# Patient Record
Sex: Female | Born: 1935
Health system: Southern US, Community
[De-identification: ages and names within clinical notes are randomized; demographics above are authoritative.]

## PROBLEM LIST (undated history)

## (undated) DIAGNOSIS — G4700 Insomnia, unspecified: Secondary | ICD-10-CM

## (undated) DIAGNOSIS — M1990 Unspecified osteoarthritis, unspecified site: Secondary | ICD-10-CM

## (undated) DIAGNOSIS — H409 Unspecified glaucoma: Secondary | ICD-10-CM

## (undated) DIAGNOSIS — M25552 Pain in left hip: Secondary | ICD-10-CM

## (undated) DIAGNOSIS — Z85828 Personal history of other malignant neoplasm of skin: Secondary | ICD-10-CM

## (undated) DIAGNOSIS — E039 Hypothyroidism, unspecified: Secondary | ICD-10-CM

## (undated) DIAGNOSIS — E785 Hyperlipidemia, unspecified: Secondary | ICD-10-CM

## (undated) DIAGNOSIS — E119 Type 2 diabetes mellitus without complications: Secondary | ICD-10-CM

## (undated) DIAGNOSIS — Z86018 Personal history of other benign neoplasm: Secondary | ICD-10-CM

## (undated) DIAGNOSIS — E46 Unspecified protein-calorie malnutrition: Secondary | ICD-10-CM

## (undated) HISTORY — DX: Insomnia, unspecified: G47.00

## (undated) HISTORY — DX: Unspecified protein-calorie malnutrition: E46

## (undated) HISTORY — DX: Pain in left hip: M25.552

## (undated) HISTORY — DX: Unspecified osteoarthritis, unspecified site: M19.90

## (undated) HISTORY — DX: Personal history of other malignant neoplasm of skin: Z85.828

## (undated) HISTORY — DX: Unspecified glaucoma: H40.9

## (undated) HISTORY — DX: Hypothyroidism, unspecified: E03.9

## (undated) HISTORY — PX: HERNIA REPAIR: SHX51

---
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
## 2005-11-08 ENCOUNTER — Ambulatory Visit: Payer: Self-pay | Admitting: Family Medicine

## 2006-11-14 ENCOUNTER — Ambulatory Visit: Payer: Self-pay

## 2007-04-25 HISTORY — PX: TOTAL ABDOMINAL HYSTERECTOMY: SHX209

## 2007-06-28 ENCOUNTER — Ambulatory Visit: Payer: Self-pay | Admitting: Unknown Physician Specialty

## 2007-12-11 ENCOUNTER — Ambulatory Visit: Payer: Self-pay | Admitting: Obstetrics and Gynecology

## 2007-12-11 ENCOUNTER — Other Ambulatory Visit: Payer: Self-pay

## 2007-12-16 ENCOUNTER — Inpatient Hospital Stay: Payer: Self-pay | Admitting: Obstetrics and Gynecology

## 2008-01-22 ENCOUNTER — Ambulatory Visit: Payer: Self-pay | Admitting: Family Medicine

## 2008-08-03 DIAGNOSIS — D239 Other benign neoplasm of skin, unspecified: Secondary | ICD-10-CM

## 2008-08-03 HISTORY — DX: Other benign neoplasm of skin, unspecified: D23.9

## 2009-04-14 ENCOUNTER — Ambulatory Visit: Payer: Self-pay

## 2009-08-23 DIAGNOSIS — C4491 Basal cell carcinoma of skin, unspecified: Secondary | ICD-10-CM

## 2009-08-23 DIAGNOSIS — C4492 Squamous cell carcinoma of skin, unspecified: Secondary | ICD-10-CM

## 2009-08-23 HISTORY — DX: Basal cell carcinoma of skin, unspecified: C44.91

## 2009-08-23 HISTORY — DX: Squamous cell carcinoma of skin, unspecified: C44.92

## 2009-11-01 DIAGNOSIS — Z85828 Personal history of other malignant neoplasm of skin: Secondary | ICD-10-CM

## 2009-11-01 HISTORY — DX: Personal history of other malignant neoplasm of skin: Z85.828

## 2010-04-29 ENCOUNTER — Ambulatory Visit: Payer: Self-pay

## 2010-05-11 ENCOUNTER — Ambulatory Visit: Payer: Self-pay

## 2011-06-14 ENCOUNTER — Ambulatory Visit: Payer: Self-pay | Admitting: Obstetrics and Gynecology

## 2012-06-14 ENCOUNTER — Ambulatory Visit: Payer: Self-pay | Admitting: Obstetrics and Gynecology

## 2013-06-27 ENCOUNTER — Ambulatory Visit: Payer: Self-pay | Admitting: Family Medicine

## 2014-05-12 ENCOUNTER — Ambulatory Visit: Payer: Self-pay | Admitting: Family Medicine

## 2014-08-07 ENCOUNTER — Ambulatory Visit
Admit: 2014-08-07 | Disposition: A | Discharge status: Home / Self Care | Payer: Self-pay | Attending: Family Medicine | Admitting: Family Medicine

## 2014-09-01 LAB — HEMOGLOBIN A1C: Hgb A1c MFr Bld: 6.2 % — AB (ref 4.0–6.0)

## 2014-09-09 LAB — BASIC METABOLIC PANEL
BUN: 16 mg/dL (ref 4–21)
Creatinine: 0.6 mg/dL (ref <=1.1)
Glucose: 103 mg/dL
POTASSIUM: 4.5 mmol/L (ref 3.4–5.3)
SODIUM: 140 mmol/L (ref 137–147)

## 2014-09-09 LAB — CBC AND DIFFERENTIAL
HEMATOCRIT: 42 % (ref 36–46)
HEMOGLOBIN: 13.7 g/dL (ref 12.0–16.0)
Neutrophils Absolute: 2 /uL
PLATELETS: 229 10*3/uL (ref 150–399)
WBC: 5.5 10^3/mL

## 2014-09-09 LAB — TSH: TSH: 1.66 u[IU]/mL (ref <=5.90)

## 2014-09-09 LAB — HEPATIC FUNCTION PANEL
ALT: 19 U/L (ref 7–35)
AST: 22 U/L (ref 13–35)
Alkaline Phosphatase: 102 U/L (ref 25–125)
BILIRUBIN, TOTAL: 0.5 mg/dL

## 2014-11-06 ENCOUNTER — Encounter: Payer: Self-pay | Admitting: Family Medicine

## 2014-11-06 ENCOUNTER — Ambulatory Visit (INDEPENDENT_AMBULATORY_CARE_PROVIDER_SITE_OTHER): Payer: PPO | Admitting: Family Medicine

## 2014-11-06 VITALS — BP 122/70 | HR 71 | Temp 98.1°F | Resp 16 | Ht 61.0 in | Wt 100.0 lb

## 2014-11-06 DIAGNOSIS — Z0000 Encounter for general adult medical examination without abnormal findings: Secondary | ICD-10-CM | POA: Diagnosis not present

## 2014-11-06 NOTE — Progress Notes (Signed)
Patient: Barbara Santos, Female    DOB: [birth year], 79 y.o.   MRN: [id number] Visit Date: 11/06/2014  Today's Provider: Dicky Doe, MD   Chief Complaint  Patient presents with  . Medicare Wellness    subsequent    Subjective:    Annual wellness visit Barbara Santos is a 79 y.o. female who presents today for her Subsequent Annual Wellness Visit. She feels well. She reports exercising . She reports she is sleeping well.   ----------------------------------------------------------- HPI  Review of Systems  History   Social History  . Marital Status: Married    Spouse Name: N/A  . Number of Children: N/A  . Years of Education: N/A   Occupational History  . Not on file.   Social History Main Topics  . Smoking status: Never Smoker   . Smokeless tobacco: Not on file  . Alcohol Use: No  . Drug Use: No  . Sexual Activity: Not on file   Other Topics Concern  . Not on file   Social History Narrative    There are no active problems to display for this patient.   Past Surgical History  Procedure Laterality Date  . Total abdominal hysterectomy  2009    Her family history includes Diabetes Mellitus II in her mother; Heart disease in her paternal grandfather; Stroke in her father and maternal grandmother.    Previous Medications   ACETAMINOPHEN (TYLENOL) 325 MG TABLET    Take 650 mg by mouth every 6 (six) hours as needed.   CALCIUM CARBONATE 1500 (600 CA) MG TABS    Take 1 tablet by mouth daily.   CHOLECALCIFEROL (VITAMIN D) 2000 UNITS TABLET    Take 2,000 Units by mouth daily.   DIPHENHYDRAMINE (BENADRYL) 25 MG TABLET    Take 25 mg by mouth every 6 (six) hours as needed.   GLUCOSE BLOOD TEST STRIP    1 each by Other route as needed for other. Use as instructed   GUAIFENESIN (MUCINEX) 600 MG 12 HR TABLET    Take by mouth 2 (two) times daily as needed.   IBUPROFEN (ADVIL,MOTRIN) 200 MG TABLET    Take 200 mg by mouth every 6 (six) hours as needed.   LACTOSE FREE NUTRITION (BOOST) LIQD    Take 237 mLs by mouth 3 (three) times daily between meals.   LEVOTHYROXINE (SYNTHROID, LEVOTHROID) 50 MCG TABLET    Take 50 mcg by mouth daily before breakfast.   MELOXICAM (MOBIC) 7.5 MG TABLET    Take 7.5 mg by mouth daily.   MULTIPLE VITAMIN (MULTIVITAMIN) TABLET    Take 1 tablet by mouth daily.   TIMOLOL (BETIMOL) 0.5 % OPHTHALMIC SOLUTION    Place 1 drop into both eyes 2 (two) times daily.    Patient Care Team: Arlis Porta., MD as PCP - General (Family Medicine)     Objective:   Vitals: BP 122/70 mmHg  Pulse 71  Temp(Src) 98.1 F (36.7 C) (Oral)  Resp 16  Ht 5\' 1"  (1.549 m)  Wt 100 lb (45.36 kg)  BMI 18.90 kg/m2  Physical Exam  Activities of Daily Living In your present state of health, do you have any difficulty performing the following activities: 11/06/2014 11/06/2014  Hearing? - -  Vision? - -  Difficulty concentrating or making decisions? - -  Walking or climbing stairs? - -  Dressing or bathing? - -  Doing errands, shopping? - -  Preparing Food and eating ? - N  Using the  Toilet? - N  In the past six months, have you accidently leaked urine? - -  Do you have problems with loss of bowel control? - -  Managing your Medications? N -  Managing your Finances? N -  Housekeeping or managing your Housekeeping? N -    Fall Risk Assessment Fall Risk  11/06/2014  Falls in the past year? No     Patient reports there are safety devices in place in shower at home.  Depression Screen PHQ 2/9 Scores 11/06/2014  PHQ - 2 Score 0     MMSE MMSE - Mini Mental State Exam 11/06/2014  Orientation to time 5  Orientation to Place 5  Registration 3  Attention/ Calculation 5  Recall 3  Language- name 2 objects 2  Language- repeat 1  Language- follow 3 step command 3  Language- read & follow direction 1  Write a sentence 1  Copy design 1  Total score 30     Assessment & Plan:     Annual Wellness Visit  Reviewed  patient's Family Medical History Reviewed and updated list of patient's medical providers Assessment of cognitive impairment was done Assessed patient's functional ability Established a written schedule for health screening [place] Completed and Reviewed  Exercise Activities and Dietary recommendations Goals    . Gain weight     Little weight gain by next year       Immunization History  Administered Date(s) Administered  . Influenza-Unspecified 01/28/2014  . Pneumococcal Conjugate-13 12/03/2013  . Tdap 12/03/2013  . Zoster 04/24/2010    Health Maintenance  Topic Date Due  . INFLUENZA VACCINE  11/23/2014  . PNA vac Low Risk Adult (2 of 2 - PPSV23) 12/04/2014  . COLONOSCOPY  04/24/2017  . TETANUS/TDAP  12/04/2023  . DEXA SCAN  Completed  . ZOSTAVAX  Completed     Discussed health benefits of physical activity, and encouraged her to engage in regular exercise appropriate for her age and condition.    ------------------------------------------------------------------------------------------------------------   Problem List Items Addressed This Visit    None       Larene Beach, MD [facility]  11/06/2014

## 2015-01-05 ENCOUNTER — Ambulatory Visit (INDEPENDENT_AMBULATORY_CARE_PROVIDER_SITE_OTHER): Payer: PPO | Admitting: Family Medicine

## 2015-01-05 ENCOUNTER — Encounter: Payer: Self-pay | Admitting: Family Medicine

## 2015-01-05 DIAGNOSIS — E039 Hypothyroidism, unspecified: Secondary | ICD-10-CM | POA: Insufficient documentation

## 2015-01-05 DIAGNOSIS — E079 Disorder of thyroid, unspecified: Secondary | ICD-10-CM

## 2015-01-05 DIAGNOSIS — E1169 Type 2 diabetes mellitus with other specified complication: Secondary | ICD-10-CM | POA: Insufficient documentation

## 2015-01-05 DIAGNOSIS — Z23 Encounter for immunization: Secondary | ICD-10-CM

## 2015-01-05 DIAGNOSIS — E119 Type 2 diabetes mellitus without complications: Secondary | ICD-10-CM | POA: Insufficient documentation

## 2015-01-05 LAB — POCT GLYCOSYLATED HEMOGLOBIN (HGB A1C): Hemoglobin A1C: 6.1

## 2015-01-05 MED ORDER — LEVOTHYROXINE SODIUM 50 MCG PO TABS: 50.0000 ug | ORAL_TABLET | Freq: Every day | ORAL | Status: DC

## 2015-01-05 NOTE — Patient Instructions (Signed)
Continue current meds at current doses.

## 2015-01-05 NOTE — Progress Notes (Signed)
Name: Barbara Santos   MRN: [id number]    DOB: 1936/03/09   Date:01/05/2015       Progress Note  Subjective  Chief Complaint  Chief Complaint  Patient presents with  . Diabetes    A1C 6.2 09/01/2014 - Sugars running 110-130    HPI  Here for f/u of DM.  BSs at home run from 100-130.  She has not been able to gain any weight.  She feels well.  No c/o. No problem-specific assessment & plan notes found for this encounter.   Past Medical History  Diagnosis Date  . Diabetes   . Hypothyroidism   . Arthritis   . Glaucoma   . Protein calorie malnutrition   . Insomnia   . Left hip pain     Social History  Substance Use Topics  . Smoking status: Never Smoker   . Smokeless tobacco: Not on file  . Alcohol Use: No     Current outpatient prescriptions:  .  Cholecalciferol (VITAMIN D) 2000 UNITS tablet, Take 2,000 Units by mouth daily., Disp: , Rfl:  .  diphenhydrAMINE (BENADRYL) 25 MG tablet, Take 25 mg by mouth every 6 (six) hours as needed., Disp: , Rfl:  .  glucose blood test strip, 1 each by Other route as needed for other. Use as instructed, Disp: , Rfl:  .  lactose free nutrition (BOOST) LIQD, Take 237 mLs by mouth 3 (three) times daily between meals., Disp: , Rfl:  .  levothyroxine (SYNTHROID, LEVOTHROID) 50 MCG tablet, Take 50 mcg by mouth daily before breakfast., Disp: , Rfl:  .  Multiple Vitamin (MULTIVITAMIN) tablet, Take 1 tablet by mouth daily., Disp: , Rfl:  .  timolol (BETIMOL) 0.5 % ophthalmic solution, Place 1 drop into both eyes 2 (two) times daily., Disp: , Rfl:   Allergies  Allergen Reactions  . Penicillins     Review of Systems  Constitutional: Negative for fever, chills, weight loss and malaise/fatigue.  HENT: Negative for hearing loss.   Eyes: Negative for blurred vision and double vision.  Respiratory: Negative for cough, sputum production, shortness of breath and wheezing.   Cardiovascular: Negative for chest pain, palpitations, orthopnea and  leg swelling.  Gastrointestinal: Negative for heartburn, abdominal pain and blood in stool.  Genitourinary: Negative for dysuria, urgency and frequency.  Musculoskeletal: Negative for myalgias and joint pain.  Skin: Negative for rash.  Neurological: Negative for dizziness, sensory change, focal weakness, weakness and headaches.  Psychiatric/Behavioral: Negative for depression. The patient is not nervous/anxious.       Objective  There were no vitals filed for this visit.   Physical Exam  Constitutional: She is oriented to person, place, and time and well-developed, well-nourished, and in no distress. No distress.  HENT:  Head: Normocephalic and atraumatic.  Eyes: Conjunctivae and EOM are normal. No scleral icterus.  Neck: Normal range of motion. Neck supple. Carotid bruit is not present. No thyromegaly present.  Cardiovascular: Normal rate, regular rhythm, normal heart sounds and intact distal pulses.  Exam reveals no gallop and no friction rub.   No murmur heard. Pulmonary/Chest: Effort normal and breath sounds normal. No respiratory distress. She has no wheezes. She has no rales.  Abdominal: Soft. Bowel sounds are normal. She exhibits no distension, no abdominal bruit and no mass. There is no tenderness.  Musculoskeletal: She exhibits no edema.  Diabetic foot exam.  Normal skin.  No lesions.  Has hypertrophic nails on great toes bilaterally.  Normal pin prick, vibratory and position  sense.  Good pulses.  Lymphadenopathy:    She has no cervical adenopathy.  Neurological: She is alert and oriented to person, place, and time.  Vitals reviewed.     Recent Results (from the past 2160 hour(s))  POCT HgB A1C     Status: Abnormal   Collection Time: 01/05/15  8:53 AM  Result Value Ref Range   Hemoglobin A1C 6.1      Assessment & Plan  1. Type 2 diabetes mellitus without complication  - POCT HgB A1C-6.1 - HM Diabetes Foot Exam - Comprehensive Metabolic Panel (CMET) - Lipid  Profile  2. Need for influenza vaccination  - Flu vaccine HIGH DOSE PF (Fluzone High dose)  3. Thyroid disease  - CBC with Differential - TSH - levothyroxine (SYNTHROID, LEVOTHROID) 50 MCG tablet; Take 1 tablet (50 mcg total) by mouth daily before breakfast.  Dispense: 30 tablet; Refill: 12  4. Serum calcium elevated

## 2015-01-13 LAB — CBC WITH DIFFERENTIAL/PLATELET
Basophils Absolute: 0 10*3/uL (ref 0.0–0.2)
Basos: 0 %
EOS (ABSOLUTE): 0.1 10*3/uL (ref 0.0–0.4)
EOS: 2 %
HEMATOCRIT: 39.8 % (ref 34.0–46.6)
Hemoglobin: 13.6 g/dL (ref 11.1–15.9)
Immature Grans (Abs): 0 10*3/uL (ref 0.0–0.1)
Immature Granulocytes: 0 %
LYMPHS ABS: 2.2 10*3/uL (ref 0.7–3.1)
Lymphs: 43 %
MCH: 31.5 pg (ref 26.6–33.0)
MCHC: 34.2 g/dL (ref 31.5–35.7)
MCV: 92 fL (ref 79–97)
MONOS ABS: 0.4 10*3/uL (ref 0.1–0.9)
Monocytes: 8 %
Neutrophils Absolute: 2.3 10*3/uL (ref 1.4–7.0)
Neutrophils: 47 %
Platelets: 238 10*3/uL (ref 150–379)
RBC: 4.32 x10E6/uL (ref 3.77–5.28)
RDW: 13.4 % (ref 12.3–15.4)
WBC: 5.1 10*3/uL (ref 3.4–10.8)

## 2015-01-13 LAB — LIPID PANEL
CHOL/HDL RATIO: 3.3 ratio (ref 0.0–4.4)
Cholesterol, Total: 176 mg/dL (ref 100–199)
HDL: 54 mg/dL (ref >39)
LDL Calculated: 106 mg/dL — ABNORMAL HIGH (ref 0–99)
TRIGLYCERIDES: 80 mg/dL (ref 0–149)
VLDL Cholesterol Cal: 16 mg/dL (ref 5–40)

## 2015-01-13 LAB — COMPREHENSIVE METABOLIC PANEL
A/G RATIO: 1.9 (ref 1.1–2.5)
ALT: 16 IU/L (ref 0–32)
AST: 22 IU/L (ref 0–40)
Albumin: 4.4 g/dL (ref 3.5–4.8)
Alkaline Phosphatase: 96 IU/L (ref 39–117)
BILIRUBIN TOTAL: 0.5 mg/dL (ref 0.0–1.2)
BUN / CREAT RATIO: 24 (ref 11–26)
BUN: 18 mg/dL (ref 8–27)
CHLORIDE: 103 mmol/L (ref 97–108)
CO2: 26 mmol/L (ref 18–29)
Calcium: 10.3 mg/dL (ref 8.7–10.3)
Creatinine, Ser: 0.75 mg/dL (ref 0.57–1.00)
GFR calc non Af Amer: 77 mL/min/{1.73_m2} (ref >59)
GFR, EST AFRICAN AMERICAN: 89 mL/min/{1.73_m2} (ref >59)
Globulin, Total: 2.3 g/dL (ref 1.5–4.5)
Glucose: 108 mg/dL — ABNORMAL HIGH (ref 65–99)
POTASSIUM: 4.7 mmol/L (ref 3.5–5.2)
Sodium: 142 mmol/L (ref 134–144)
Total Protein: 6.7 g/dL (ref 6.0–8.5)

## 2015-01-13 LAB — TSH: TSH: 2.2 u[IU]/mL (ref 0.450–4.500)

## 2015-01-13 NOTE — Progress Notes (Signed)
Mailed

## 2015-04-13 ENCOUNTER — Ambulatory Visit (INDEPENDENT_AMBULATORY_CARE_PROVIDER_SITE_OTHER): Payer: PPO | Admitting: Family Medicine

## 2015-04-13 ENCOUNTER — Encounter: Payer: Self-pay | Admitting: Family Medicine

## 2015-04-13 VITALS — BP 134/73 | HR 61 | Resp 16 | Ht 61.0 in | Wt 100.0 lb

## 2015-04-13 DIAGNOSIS — E119 Type 2 diabetes mellitus without complications: Secondary | ICD-10-CM

## 2015-04-13 DIAGNOSIS — E079 Disorder of thyroid, unspecified: Secondary | ICD-10-CM | POA: Diagnosis not present

## 2015-04-13 LAB — POCT GLYCOSYLATED HEMOGLOBIN (HGB A1C): HEMOGLOBIN A1C: 6.1

## 2015-04-13 NOTE — Progress Notes (Signed)
Name: Barbara Santos   MRN: [id number]    DOB: 1936/03/31   Date:04/13/2015       Progress Note  Subjective  Chief Complaint  Chief Complaint  Patient presents with  . Diabetes    Range 104-152   A1C 6.1 ON 01/05/2015.     HPI Here for f/u of diet controlled DM.  Also with hypothyroidism.  Taking thyroid med and following low sugar diet. Feeling well overall. No problem-specific assessment & plan notes found for this encounter.   Past Medical History  Diagnosis Date  . Diabetes ([place])   . Hypothyroidism   . Arthritis   . Glaucoma   . Protein calorie malnutrition ([place])   . Insomnia   . Left hip pain     Past Surgical History  Procedure Laterality Date  . Total abdominal hysterectomy  2009    Family History  Problem Relation Age of Onset  . Diabetes Mellitus II Mother   . Stroke Father   . Stroke Maternal Grandmother   . Heart disease Paternal Grandfather     Social History   Social History  . Marital Status: Married    Spouse Name: N/A  . Number of Children: N/A  . Years of Education: N/A   Occupational History  . Not on file.   Social History Main Topics  . Smoking status: Never Smoker   . Smokeless tobacco: Not on file  . Alcohol Use: No  . Drug Use: No  . Sexual Activity: Not on file   Other Topics Concern  . Not on file   Social History Narrative     Current outpatient prescriptions:  .  Cholecalciferol (VITAMIN D) 2000 UNITS tablet, Take 2,000 Units by mouth daily., Disp: , Rfl:  .  diphenhydrAMINE (BENADRYL) 25 MG tablet, Take 25 mg by mouth every 6 (six) hours as needed., Disp: , Rfl:  .  glucose blood test strip, 1 each by Other route as needed for other. Use as instructed, Disp: , Rfl:  .  lactose free nutrition (BOOST) LIQD, Take 237 mLs by mouth 3 (three) times daily between meals., Disp: , Rfl:  .  levothyroxine (SYNTHROID, LEVOTHROID) 50 MCG tablet, Take 1 tablet (50 mcg total) by mouth daily before breakfast., Disp: 30 tablet,  Rfl: 12 .  Multiple Vitamin (MULTIVITAMIN) tablet, Take 1 tablet by mouth daily., Disp: , Rfl:  .  timolol (BETIMOL) 0.5 % ophthalmic solution, Place 1 drop into both eyes 2 (two) times daily., Disp: , Rfl:   Allergies  Allergen Reactions  . Penicillins      Review of Systems  Constitutional: Negative for fever, chills, weight loss and malaise/fatigue.  HENT: Negative for hearing loss.   Eyes: Negative for blurred vision and double vision.  Respiratory: Negative for cough, shortness of breath and wheezing.   Cardiovascular: Negative for chest pain, palpitations and leg swelling.  Gastrointestinal: Negative for heartburn, abdominal pain and blood in stool.  Genitourinary: Negative for dysuria, urgency and frequency.  Musculoskeletal: Negative for myalgias and joint pain.  Skin: Negative for rash.  Neurological: Negative for dizziness, tremors, weakness and headaches.      Objective  Filed Vitals:   04/13/15 1005  BP: 134/73  Pulse: 61  Resp: 16  Height: 5\' 1"  (1.549 m)  Weight: 100 lb (45.36 kg)    Physical Exam  Constitutional: She is oriented to person, place, and time and well-developed, well-nourished, and in no distress. No distress.  HENT:  Head: Normocephalic and atraumatic.  Eyes: Conjunctivae and EOM are normal. Pupils are equal, round, and reactive to light. No scleral icterus.  Neck: Normal range of motion. Neck supple. Carotid bruit is not present. No thyromegaly present.  Cardiovascular: Normal rate, regular rhythm and normal heart sounds.  Exam reveals no gallop and no friction rub.   No murmur heard. Pulmonary/Chest: Effort normal and breath sounds normal. No respiratory distress. She has no wheezes. She has no rales.  Abdominal: Bowel sounds are normal. She exhibits no distension, no abdominal bruit and no mass. There is no tenderness.  Musculoskeletal: Normal range of motion. She exhibits no edema.  Lymphadenopathy:    She has no cervical adenopathy.   Neurological: She is alert and oriented to person, place, and time.  Vitals reviewed.      Recent Results (from the past 2160 hour(s))  POCT HgB A1C     Status: Abnormal   Collection Time: 04/13/15 10:32 AM  Result Value Ref Range   Hemoglobin A1C 6.1      Assessment & Plan  Problem List Items Addressed This Visit      Endocrine   Diabetes ([place]) - Primary   Relevant Orders   POCT HgB A1C (Completed)   Thyroid disease      No orders of the defined types were placed in this encounter.   /1. Type 2 diabetes mellitus without complication, unspecified long term insulin use status (HCC) -cont. diet - POCT HgB A1C-6.1 2. Thyroid disease -cont Levothyroxine at current dose

## 2015-08-09 ENCOUNTER — Other Ambulatory Visit: Payer: Self-pay | Admitting: Family Medicine

## 2015-10-21 ENCOUNTER — Ambulatory Visit (INDEPENDENT_AMBULATORY_CARE_PROVIDER_SITE_OTHER): Payer: Medicare HMO | Admitting: Family Medicine

## 2015-10-21 ENCOUNTER — Encounter: Payer: Self-pay | Admitting: Family Medicine

## 2015-10-21 VITALS — BP 121/73 | HR 67 | Temp 98.0°F | Resp 16 | Ht 61.0 in | Wt 99.6 lb

## 2015-10-21 DIAGNOSIS — E079 Disorder of thyroid, unspecified: Secondary | ICD-10-CM

## 2015-10-21 DIAGNOSIS — E119 Type 2 diabetes mellitus without complications: Secondary | ICD-10-CM

## 2015-10-21 NOTE — Progress Notes (Signed)
Name: Barbara Santos   MRN: [id number]    DOB: 1935-08-19   Date:10/21/2015       Progress Note  Subjective  Chief Complaint  Chief Complaint  Patient presents with  . Diabetes    last A1C 6.1 04/13/15  . Thyroid Problem    HPI Here for f/u of Hypothyroidism and diet controlled DM.  She is taking  Her Levothyroxine  No problem-specific assessment & plan notes found for this encounter.   Past Medical History  Diagnosis Date  . Diabetes ([place])   . Hypothyroidism   . Arthritis   . Glaucoma   . Protein calorie malnutrition ([place])   . Insomnia   . Left hip pain     Past Surgical History  Procedure Laterality Date  . Total abdominal hysterectomy  2009    Family History  Problem Relation Age of Onset  . Diabetes Mellitus II Mother   . Stroke Father   . Stroke Maternal Grandmother   . Heart disease Paternal Grandfather     Social History   Social History  . Marital Status: Married    Spouse Name: N/A  . Number of Children: N/A  . Years of Education: N/A   Occupational History  . Not on file.   Social History Main Topics  . Smoking status: Never Smoker   . Smokeless tobacco: Never Used  . Alcohol Use: No  . Drug Use: No  . Sexual Activity: Not on file   Other Topics Concern  . Not on file   Social History Narrative     Current outpatient prescriptions:  .  Cholecalciferol (VITAMIN D) 2000 UNITS tablet, Take 2,000 Units by mouth daily., Disp: , Rfl:  .  diphenhydrAMINE (BENADRYL) 25 MG tablet, Take 25 mg by mouth every 6 (six) hours as needed., Disp: , Rfl:  .  dorzolamide-timolol (COSOPT) 22.3-6.8 MG/ML ophthalmic solution, Place 1 drop into the right eye 2 (two) times daily., Disp: , Rfl: 0 .  lactose free nutrition (BOOST) LIQD, Take 237 mLs by mouth 3 (three) times daily between meals., Disp: , Rfl:  .  levothyroxine (SYNTHROID, LEVOTHROID) 50 MCG tablet, Take 1 tablet (50 mcg total) by mouth daily before breakfast., Disp: 30 tablet, Rfl: 12 .   Multiple Vitamin (MULTIVITAMIN) tablet, Take 1 tablet by mouth daily., Disp: , Rfl:  .  ONETOUCH DELICA LANCETS FINE MISC, 1 each by Other route 3 (three) times daily., Disp: , Rfl: 0 .  ONETOUCH VERIO test strip, USE AS DIRECTED ONCE DAILY, Disp: 50 each, Rfl: 12 .  timolol (BETIMOL) 0.5 % ophthalmic solution, Place 1 drop into both eyes 2 (two) times daily., Disp: , Rfl:   Allergies  Allergen Reactions  . Penicillins      Review of Systems  Constitutional: Negative for fever, chills, weight loss and malaise/fatigue.  HENT: Negative for hearing loss.   Eyes: Negative for blurred vision and double vision.  Respiratory: Negative for cough, shortness of breath and wheezing.   Cardiovascular: Negative for chest pain, palpitations and leg swelling.  Gastrointestinal: Negative for heartburn, abdominal pain and blood in stool.  Genitourinary: Negative for dysuria, urgency and frequency.  Skin: Negative for rash.  Neurological: Negative for dizziness, tremors, weakness and headaches.      Objective  Filed Vitals:   10/21/15 0847  BP: 121/73  Pulse: 67  Temp: 98 F (36.7 C)  TempSrc: Oral  Resp: 16  Height: 5\' 1"  (1.549 m)  Weight: 99 lb 9.6 oz (45.178  kg)    Physical Exam  Constitutional: She is oriented to person, place, and time and well-developed, well-nourished, and in no distress. No distress.  HENT:  Head: Normocephalic and atraumatic.  Eyes: Conjunctivae and EOM are normal. Pupils are equal, round, and reactive to light. No scleral icterus.  Neck: Normal range of motion. Neck supple. Carotid bruit is not present. No thyromegaly present.  Cardiovascular: Normal rate, regular rhythm and normal heart sounds.  Exam reveals no gallop and no friction rub.   No murmur heard. Pulmonary/Chest: Effort normal and breath sounds normal. No respiratory distress. She has no wheezes. She has no rales.  Musculoskeletal: She exhibits no edema.  Lymphadenopathy:    She has no cervical  adenopathy.  Neurological: She is alert and oriented to person, place, and time.  Vitals reviewed.      No results found for this or any previous visit (from the past 2160 hour(s)).   Assessment & Plan  Problem List Items Addressed This Visit      Endocrine   Diabetes ([place]) - Primary   Relevant Orders   POCT HgB A1C   COMPLETE METABOLIC PANEL WITH GFR   CBC with Differential   Lipid Profile   Thyroid disease   Relevant Orders   TSH     Other   Serum calcium elevated      Meds ordered this encounter  Medications  . ONETOUCH DELICA LANCETS FINE MISC    Sig: 1 each by Other route 3 (three) times daily.    Refill:  0  . dorzolamide-timolol (COSOPT) 22.3-6.8 MG/ML ophthalmic solution    Sig: Place 1 drop into the right eye 2 (two) times daily.    Refill:  0   1. Type 2 diabetes mellitus without complication, unspecified long term insulin use status (HCC)  - POCT HgB A1C-6.1 - COMPLETE METABOLIC PANEL WITH GFR - CBC with Differential - Lipid Profile  2. Thyroid disease Cont Levothyroxine, 50 mcg/d - TSH  3. Serum calcium elevated

## 2015-10-22 LAB — POCT GLYCOSYLATED HEMOGLOBIN (HGB A1C): Hemoglobin A1C: 6.1

## 2015-10-22 NOTE — Progress Notes (Signed)
Done

## 2015-11-01 ENCOUNTER — Telehealth: Payer: Self-pay

## 2015-11-01 NOTE — Telephone Encounter (Signed)
Pt.notified

## 2015-11-01 NOTE — Telephone Encounter (Signed)
[email or web address]. sd  

## 2015-11-01 NOTE — Telephone Encounter (Signed)
-----   Message from Jeanelle Malling, [place] sent at 11/01/2015  2:18 PM EDT -----   ----- Message -----    From: Arlis Porta., MD    Sent: 11/01/2015  12:33 PM      To: [facility]   A1c is 6.1.  Same as last winter.  Just watch sugars.-jh

## 2015-11-02 ENCOUNTER — Other Ambulatory Visit: Payer: PPO

## 2015-11-03 LAB — COMPLETE METABOLIC PANEL WITH GFR
ALT: 17 U/L (ref 6–29)
AST: 20 U/L (ref 10–35)
Albumin: 4.1 g/dL (ref 3.6–5.1)
Alkaline Phosphatase: 93 U/L (ref 33–130)
BUN: 19 mg/dL (ref 7–25)
CHLORIDE: 106 mmol/L (ref 98–110)
CO2: 27 mmol/L (ref 20–31)
CREATININE: 0.71 mg/dL (ref 0.60–0.93)
Calcium: 10.1 mg/dL (ref 8.6–10.4)
GFR, Est African American: >89 mL/min (ref >=60)
GFR, Est Non African American: 82 mL/min (ref >=60)
GLUCOSE: 106 mg/dL — AB (ref 65–99)
POTASSIUM: 4.8 mmol/L (ref 3.5–5.3)
SODIUM: 138 mmol/L (ref 135–146)
Total Bilirubin: 0.5 mg/dL (ref 0.2–1.2)
Total Protein: 6.6 g/dL (ref 6.1–8.1)

## 2015-11-03 LAB — CBC WITH DIFFERENTIAL/PLATELET
BASOS ABS: 0 {cells}/uL (ref 0–200)
Basophils Relative: 0 %
Eosinophils Absolute: 92 cells/uL (ref 15–500)
Eosinophils Relative: 2 %
HEMATOCRIT: 39.7 % (ref 35.0–45.0)
HEMOGLOBIN: 13.2 g/dL (ref 11.7–15.5)
LYMPHS ABS: 2162 {cells}/uL (ref 850–3900)
LYMPHS PCT: 47 %
MCH: 30.9 pg (ref 27.0–33.0)
MCHC: 33.2 g/dL (ref 32.0–36.0)
MCV: 93 fL (ref 80.0–100.0)
MONO ABS: 506 {cells}/uL (ref 200–950)
MPV: 10.7 fL (ref 7.5–12.5)
Monocytes Relative: 11 %
NEUTROS PCT: 40 %
Neutro Abs: 1840 cells/uL (ref 1500–7800)
Platelets: 231 10*3/uL (ref 140–400)
RBC: 4.27 MIL/uL (ref 3.80–5.10)
RDW: 12.6 % (ref 11.0–15.0)
WBC: 4.6 10*3/uL (ref 3.8–10.8)

## 2015-11-03 LAB — LIPID PANEL
CHOL/HDL RATIO: 2.8 ratio (ref <=5.0)
CHOLESTEROL: 166 mg/dL (ref 125–200)
HDL: 59 mg/dL (ref >=46)
LDL Cholesterol: 93 mg/dL (ref <130)
Triglycerides: 70 mg/dL (ref <150)
VLDL: 14 mg/dL (ref <30)

## 2015-11-03 LAB — TSH: TSH: 1.07 mIU/L

## 2015-11-29 ENCOUNTER — Other Ambulatory Visit: Payer: Self-pay | Admitting: Family Medicine

## 2015-11-29 DIAGNOSIS — Z1231 Encounter for screening mammogram for malignant neoplasm of breast: Secondary | ICD-10-CM

## 2015-12-09 ENCOUNTER — Ambulatory Visit
Admission: RE | Admit: 2015-12-09 | Discharge: 2015-12-09 | Disposition: A | Discharge status: Home / Self Care | Payer: Medicare HMO | Source: Ambulatory Visit | Attending: Family Medicine | Admitting: Family Medicine

## 2015-12-09 ENCOUNTER — Other Ambulatory Visit: Payer: Self-pay | Admitting: Family Medicine

## 2015-12-09 DIAGNOSIS — Z1231 Encounter for screening mammogram for malignant neoplasm of breast: Secondary | ICD-10-CM | POA: Insufficient documentation

## 2015-12-28 LAB — HM DIABETES EYE EXAM

## 2016-01-07 ENCOUNTER — Other Ambulatory Visit: Payer: Self-pay | Admitting: Family Medicine

## 2016-01-07 DIAGNOSIS — E079 Disorder of thyroid, unspecified: Secondary | ICD-10-CM

## 2016-01-12 NOTE — Progress Notes (Signed)
Eye exam?

## 2016-01-19 ENCOUNTER — Encounter: Payer: Self-pay | Admitting: Family Medicine

## 2016-02-24 ENCOUNTER — Ambulatory Visit (INDEPENDENT_AMBULATORY_CARE_PROVIDER_SITE_OTHER): Payer: Medicare HMO | Admitting: Family Medicine

## 2016-02-24 ENCOUNTER — Encounter: Payer: Self-pay | Admitting: Family Medicine

## 2016-02-24 VITALS — BP 130/60 | HR 60 | Temp 97.8°F | Resp 16 | Ht 61.0 in | Wt 99.0 lb

## 2016-02-24 DIAGNOSIS — E0800 Diabetes mellitus due to underlying condition with hyperosmolarity without nonketotic hyperglycemic-hyperosmolar coma (NKHHC): Secondary | ICD-10-CM | POA: Diagnosis not present

## 2016-02-24 DIAGNOSIS — Z23 Encounter for immunization: Secondary | ICD-10-CM | POA: Diagnosis not present

## 2016-02-24 DIAGNOSIS — E079 Disorder of thyroid, unspecified: Secondary | ICD-10-CM | POA: Diagnosis not present

## 2016-02-24 LAB — HEMOGLOBIN A1C
HEMOGLOBIN A1C: 6 % — AB (ref <5.7)
Mean Plasma Glucose: 126 mg/dL

## 2016-02-24 NOTE — Progress Notes (Signed)
Name: Barbara Santos   MRN: [id number]    DOB: [birth year]   Date:02/24/2016       Progress Note  Subjective  Chief Complaint  Chief Complaint  Patient presents with  . Diabetes  . Hypothyroidism    HPI Here for f/u of DM.  She has been diet controlled.  Her BSs at home over past 2 months have ranged from 95-190.  Weight is stable at 99#.  She is also hypothyroid and is controlled on medication.  No problem-specific Assessment & Plan notes found for this encounter.   Past Medical History:  Diagnosis Date  . Arthritis   . Diabetes ([place])   . Glaucoma   . Hypothyroidism   . Insomnia   . Left hip pain   . Protein calorie malnutrition ([place])     Past Surgical History:  Procedure Laterality Date  . TOTAL ABDOMINAL HYSTERECTOMY  2009    Family History  Problem Relation Age of Onset  . Diabetes Mellitus II Mother   . Stroke Father   . Stroke Maternal Grandmother   . Heart disease Paternal Grandfather     Social History   Social History  . Marital status: Married    Spouse name: N/A  . Number of children: N/A  . Years of education: N/A   Occupational History  . Not on file.   Social History Main Topics  . Smoking status: Never Smoker  . Smokeless tobacco: Never Used  . Alcohol use No  . Drug use: No  . Sexual activity: Not on file   Other Topics Concern  . Not on file   Social History Narrative  . No narrative on file     Current Outpatient Prescriptions:  .  Cholecalciferol (VITAMIN D) 2000 UNITS tablet, Take 2,000 Units by mouth daily., Disp: , Rfl:  .  diphenhydrAMINE (BENADRYL) 25 MG tablet, Take 25 mg by mouth every 6 (six) hours as needed., Disp: , Rfl:  .  lactose free nutrition (BOOST) LIQD, Take 237 mLs by mouth 3 (three) times daily between meals., Disp: , Rfl:  .  levothyroxine (SYNTHROID, LEVOTHROID) 50 MCG tablet, TAKE ONE TABLET BY MOUTH EVERY DAY BEFORE BREAKFAST, Disp: 30 tablet, Rfl: 2 .  Multiple Vitamin (MULTIVITAMIN) tablet,  Take 1 tablet by mouth daily., Disp: , Rfl:  .  ONETOUCH DELICA LANCETS FINE MISC, 1 each by Other route 3 (three) times daily., Disp: , Rfl: 0 .  ONETOUCH VERIO test strip, USE AS DIRECTED ONCE DAILY, Disp: 50 each, Rfl: 12 .  dorzolamide-timolol (COSOPT) 22.3-6.8 MG/ML ophthalmic solution, Place 1 drop into both eyes 2 (two) times daily. , Disp: , Rfl: 0  Allergies  Allergen Reactions  . Penicillins      Review of Systems  Constitutional: Negative for chills, fever, malaise/fatigue and weight loss.  HENT: Negative for hearing loss.   Eyes: Negative for blurred vision and double vision.  Respiratory: Negative for cough, shortness of breath and wheezing.   Cardiovascular: Negative for chest pain, palpitations and leg swelling.  Gastrointestinal: Negative for abdominal pain, blood in stool, diarrhea, heartburn, nausea and vomiting.  Genitourinary: Negative for dysuria, frequency and urgency.  Musculoskeletal: Negative for back pain, joint pain and myalgias.  Skin: Negative for rash.  Neurological: Negative for dizziness, tremors, weakness and headaches.      Objective  Vitals:   02/24/16 0837 02/24/16 0856  BP: (!) 151/74 130/60  Pulse: 60   Resp: 16   Temp: 97.8 F (36.6 C)  TempSrc: Oral   Weight: 99 lb (44.9 kg)   Height: 5\' 1"  (1.549 m)     Physical Exam  Constitutional: She is oriented to person, place, and time. No distress.  HENT:  Head: Normocephalic and atraumatic.  Eyes: Conjunctivae and EOM are normal. No scleral icterus.  Neck: Normal range of motion. Neck supple. Carotid bruit is not present. No thyromegaly present.  Cardiovascular: Normal rate, regular rhythm and normal heart sounds.  Exam reveals no gallop and no friction rub.   No murmur heard. Pulmonary/Chest: Effort normal and breath sounds normal. No respiratory distress. She has no wheezes. She has no rales.  Abdominal: Soft. Bowel sounds are normal. She exhibits no distension and no mass. There is  no tenderness.  Musculoskeletal: She exhibits no edema.  Lymphadenopathy:    She has no cervical adenopathy.  Neurological: She is alert and oriented to person, place, and time.  Vitals reviewed.      Recent Results (from the past 2160 hour(s))  HM DIABETES EYE EXAM     Status: None   Collection Time: 12/28/15 12:00 AM  Result Value Ref Range   HM Diabetic Eye Exam No Retinopathy No Retinopathy     Assessment & Plan  Problem List Items Addressed This Visit      Endocrine   Diabetes ([place])   Relevant Orders   HgB A1c   Thyroid disease    Other Visit Diagnoses    Need for vaccination    -  Primary   Relevant Orders   Flu vaccine HIGH DOSE PF (Fluzone High dose)      No orders of the defined types were placed in this encounter.  1. Need for vaccination  - Flu vaccine HIGH DOSE PF (Fluzone High dose)  2. Diabetes mellitus due to underlying condition with hyperosmolarity without coma, without long-term current use of insulin (HCC)-diet controlled  - HgB A1c  3. Thyroid disease Cont med.

## 2016-04-08 ENCOUNTER — Other Ambulatory Visit: Payer: Self-pay | Admitting: Family Medicine

## 2016-04-08 DIAGNOSIS — E079 Disorder of thyroid, unspecified: Secondary | ICD-10-CM

## 2016-06-27 ENCOUNTER — Ambulatory Visit (INDEPENDENT_AMBULATORY_CARE_PROVIDER_SITE_OTHER): Payer: Medicare Other | Admitting: Family Medicine

## 2016-06-27 ENCOUNTER — Encounter: Payer: Self-pay | Admitting: Family Medicine

## 2016-06-27 VITALS — BP 150/60 | HR 71 | Temp 97.6°F | Resp 16 | Ht 61.0 in | Wt 98.0 lb

## 2016-06-27 DIAGNOSIS — E0800 Diabetes mellitus due to underlying condition with hyperosmolarity without nonketotic hyperglycemic-hyperosmolar coma (NKHHC): Secondary | ICD-10-CM | POA: Diagnosis not present

## 2016-06-27 DIAGNOSIS — E079 Disorder of thyroid, unspecified: Secondary | ICD-10-CM | POA: Diagnosis not present

## 2016-06-27 MED ORDER — LEVOTHYROXINE SODIUM 50 MCG PO TABS: ORAL_TABLET | ORAL | 3 refills | Status: DC

## 2016-06-27 NOTE — Progress Notes (Signed)
Name: Barbara Santos   MRN: [id number]    DOB: [birth year]   Date:06/27/2016       Progress Note  Subjective  Chief Complaint  Chief Complaint  Patient presents with  . Diabetes  . Hypothyroidism    HPI Here for f/u of diet controlled DM and Hypothyroidism.   She feels well.  BSs from 100 - 200 with most <160.  She has no c/o.  No problem-specific Assessment & Plan notes found for this encounter.   Past Medical History:  Diagnosis Date  . Arthritis   . Diabetes ([place])   . Glaucoma   . Hypothyroidism   . Insomnia   . Left hip pain   . Protein calorie malnutrition ([place])     Past Surgical History:  Procedure Laterality Date  . TOTAL ABDOMINAL HYSTERECTOMY  2009    Family History  Problem Relation Age of Onset  . Diabetes Mellitus II Mother   . Stroke Father   . Stroke Maternal Grandmother   . Heart disease Paternal Grandfather     Social History   Social History  . Marital status: Married    Spouse name: N/A  . Number of children: N/A  . Years of education: N/A   Occupational History  . Not on file.   Social History Main Topics  . Smoking status: Never Smoker  . Smokeless tobacco: Never Used  . Alcohol use No  . Drug use: No  . Sexual activity: Not on file   Other Topics Concern  . Not on file   Social History Narrative  . No narrative on file     Current Outpatient Prescriptions:  .  Cholecalciferol (VITAMIN D) 2000 UNITS tablet, Take 2,000 Units by mouth daily., Disp: , Rfl:  .  diphenhydrAMINE (BENADRYL) 25 MG tablet, Take 25 mg by mouth every 6 (six) hours as needed., Disp: , Rfl:  .  dorzolamide-timolol (COSOPT) 22.3-6.8 MG/ML ophthalmic solution, Place 1 drop into both eyes 2 (two) times daily. , Disp: , Rfl: 0 .  lactose free nutrition (BOOST) LIQD, Take 237 mLs by mouth 3 (three) times daily between meals., Disp: , Rfl:  .  levothyroxine (SYNTHROID, LEVOTHROID) 50 MCG tablet, TAKE 1 TABLET BY MOUTH EVERY DAY BEFORE BREAKFAST, Disp: 30  tablet, Rfl: 2 .  Multiple Vitamin (MULTIVITAMIN) tablet, Take 1 tablet by mouth daily., Disp: , Rfl:  .  ONETOUCH DELICA LANCETS FINE MISC, 1 each by Other route 3 (three) times daily., Disp: , Rfl: 0 .  ONETOUCH VERIO test strip, USE AS DIRECTED ONCE DAILY, Disp: 50 each, Rfl: 12  Allergies  Allergen Reactions  . Penicillins      Review of Systems  Constitutional: Negative for chills, fever, malaise/fatigue and weight loss.  HENT: Negative for hearing loss and tinnitus.   Eyes: Negative for blurred vision and double vision.  Respiratory: Negative for cough, shortness of breath and wheezing.   Cardiovascular: Negative for chest pain, palpitations and leg swelling.  Gastrointestinal: Negative for abdominal pain, blood in stool and heartburn.  Genitourinary: Negative for dysuria, frequency and urgency.  Musculoskeletal: Negative for joint pain and myalgias.  Skin: Negative for rash.  Neurological: Negative for dizziness, tingling, tremors, weakness and headaches.      Objective  Vitals:   06/27/16 0923 06/27/16 0952  BP: (!) 158/78 (!) 150/60  Pulse: 71   Resp: 16   Temp: 97.6 F (36.4 C)   TempSrc: Oral   Weight: 98 lb (44.5 kg)   Height:  5\' 1"  (1.549 m)     Physical Exam  Constitutional: She is oriented to person, place, and time and well-developed, well-nourished, and in no distress. No distress.  HENT:  Head: Normocephalic and atraumatic.  Eyes: Conjunctivae and EOM are normal. Pupils are equal, round, and reactive to light. No scleral icterus.  Neck: Normal range of motion. Neck supple. Carotid bruit is not present. No thyromegaly present.  Cardiovascular: Normal rate, regular rhythm and normal heart sounds.  Exam reveals no gallop and no friction rub.   No murmur heard. Pulmonary/Chest: Effort normal and breath sounds normal. No respiratory distress. She has no wheezes. She has no rales.  Musculoskeletal: She exhibits no edema.  Lymphadenopathy:    She has no  cervical adenopathy.  Neurological: She is alert and oriented to person, place, and time.  Vitals reviewed.      No results found for this or any previous visit (from the past 2160 hour(s)).   Assessment & Plan  Problem List Items Addressed This Visit      Endocrine   Diabetes ([place])   Relevant Orders   HgB A1c   Thyroid disease - Primary   Relevant Orders   TSH      No orders of the defined types were placed in this encounter.  1. Thyroid disease cont Levothyroxine 50 mcg/d. - TSH  2. Diabetes mellitus due to underlying condition with hyperosmolarity without coma, without long-term current use of insulin (HCC) Cont to watch better - HgB A1c

## 2016-06-28 LAB — TSH: TSH: 2.04 m[IU]/L

## 2016-06-28 LAB — HEMOGLOBIN A1C
HEMOGLOBIN A1C: 6.3 % — AB (ref <5.7)
MEAN PLASMA GLUCOSE: 134 mg/dL

## 2016-07-06 ENCOUNTER — Other Ambulatory Visit: Payer: Self-pay | Admitting: Family Medicine

## 2016-07-06 DIAGNOSIS — E079 Disorder of thyroid, unspecified: Secondary | ICD-10-CM

## 2016-08-10 ENCOUNTER — Other Ambulatory Visit: Payer: Self-pay | Admitting: *Deleted

## 2016-08-10 MED ORDER — GLUCOSE BLOOD VI STRP: ORAL_STRIP | 12 refills | Status: DC

## 2016-08-15 DIAGNOSIS — B351 Tinea unguium: Secondary | ICD-10-CM | POA: Diagnosis not present

## 2016-08-15 DIAGNOSIS — Q828 Other specified congenital malformations of skin: Secondary | ICD-10-CM | POA: Diagnosis not present

## 2016-08-15 DIAGNOSIS — E119 Type 2 diabetes mellitus without complications: Secondary | ICD-10-CM | POA: Diagnosis not present

## 2016-10-24 ENCOUNTER — Ambulatory Visit (INDEPENDENT_AMBULATORY_CARE_PROVIDER_SITE_OTHER): Payer: Medicare Other

## 2016-10-24 VITALS — BP 118/70 | HR 74 | Temp 98.1°F | Resp 15 | Ht 61.0 in | Wt 96.8 lb

## 2016-10-24 DIAGNOSIS — Z23 Encounter for immunization: Secondary | ICD-10-CM

## 2016-10-24 DIAGNOSIS — Z0000 Encounter for general adult medical examination without abnormal findings: Secondary | ICD-10-CM | POA: Diagnosis not present

## 2016-10-24 NOTE — Progress Notes (Signed)
Subjective:   Barbara Santos is a 81 y.o. female who presents for Medicare Annual (Subsequent) preventive examination.   Review of Systems:   Cardiac Risk Factors include: advanced age (>77men, >19 women);hypertension;diabetes mellitus;dyslipidemia     Objective:     Vitals: BP 118/70 (BP Location: Left Arm, Patient Position: Sitting)   Pulse 74   Temp 98.1 F (36.7 C)   Resp 15   Ht 5\' 1"  (1.549 m)   Wt 96 lb 12.8 oz (43.9 kg)   BMI 18.29 kg/m   Body mass index is 18.29 kg/m.   Tobacco History  Smoking Status  . Never Smoker  Smokeless Tobacco  . Never Used     Counseling given: Not Answered   Past Medical History:  Diagnosis Date  . Arthritis   . Diabetes ([place])   . Glaucoma   . Hypothyroidism   . Insomnia   . Left hip pain   . Protein calorie malnutrition ([place])    Past Surgical History:  Procedure Laterality Date  . TOTAL ABDOMINAL HYSTERECTOMY  2009   Family History  Problem Relation Age of Onset  . Diabetes Mellitus II Mother   . Stroke Father   . Stroke Maternal Grandmother   . Heart disease Paternal Grandfather    History  Sexual Activity  . Sexual activity: Not on file    Outpatient Encounter Prescriptions as of 10/24/2016  Medication Sig  . Cholecalciferol (VITAMIN D) 2000 UNITS tablet Take 2,000 Units by mouth daily.  . diphenhydrAMINE (BENADRYL) 25 MG tablet Take 25 mg by mouth every 6 (six) hours as needed.  . dorzolamide-timolol (COSOPT) 22.3-6.8 MG/ML ophthalmic solution Place 1 drop into both eyes 2 (two) times daily.   Marland Kitchen glucose blood (ONETOUCH VERIO) test strip USE AS DIRECTED ONCE DAILY DX:E11.9  . lactose free nutrition (BOOST) LIQD Take 237 mLs by mouth 3 (three) times daily between meals.  Marland Kitchen levothyroxine (SYNTHROID, LEVOTHROID) 50 MCG tablet TAKE 1 TABLET BY MOUTH EVERY DAY BEFORE BREAKFAST  . Multiple Vitamin (MULTIVITAMIN) tablet Take 1 tablet by mouth daily.  Glory Rosebush DELICA LANCETS FINE MISC 1 each by Other  route 3 (three) times daily.   No facility-administered encounter medications on file as of 10/24/2016.     Activities of Daily Living In your present state of health, do you have any difficulty performing the following activities: 10/24/2016 06/27/2016  Hearing? N N  Vision? N N  Difficulty concentrating or making decisions? N N  Walking or climbing stairs? N N  Dressing or bathing? - N  Doing errands, shopping? N N  Preparing Food and eating ? N -  Using the Toilet? N -  In the past six months, have you accidently leaked urine? N -  Do you have problems with loss of bowel control? N -  Managing your Medications? N -  Managing your Finances? N -  Housekeeping or managing your Housekeeping? N -  Some recent data might be hidden    Patient Care Team: Arlis Porta., MD as PCP - General (Family Medicine)    Assessment:     Exercise Activities and Dietary recommendations Current Exercise Habits: The patient does not participate in regular exercise at present, Exercise limited by: None identified  Goals    . Gain weight          Little weight gain by next year    . Gain weight          Some weight gain  Fall Risk Fall Risk  10/24/2016 06/27/2016 02/24/2016 04/13/2015 11/06/2014  Falls in the past year? Yes Yes No No No  Number falls in past yr: 1 1 - - -  Injury with Fall? - No - - -  Follow up Falls prevention discussed - - - -   Depression Screen PHQ 2/9 Scores 10/24/2016 06/27/2016 02/24/2016 04/13/2015  PHQ - 2 Score 0 0 0 2  PHQ- 9 Score - - - 5     Cognitive Function MMSE - Mini Mental State Exam 11/06/2014  Orientation to time 5  Orientation to Place 5  Registration 3  Attention/ Calculation 5  Recall 3  Language- name 2 objects 2  Language- repeat 1  Language- follow 3 step command 3  Language- read & follow direction 1  Write a sentence 1  Copy design 1  Total score 30     6CIT Screen 10/24/2016  What Year? 0 points  What month? 0 points  What  time? 0 points  Count back from 20 0 points  Months in reverse 0 points  Repeat phrase 0 points  Total Score 0    Immunization History  Administered Date(s) Administered  . Influenza, High Dose Seasonal PF 01/05/2015, 02/24/2016  . Influenza-Unspecified 01/28/2014  . Pneumococcal Conjugate-13 12/03/2013  . Pneumococcal Polysaccharide-23 10/24/2016  . Tdap 12/03/2013  . Zoster 04/24/2010   Screening Tests Health Maintenance  Topic Date Due  . FOOT EXAM  10/20/2016  . URINE MICROALBUMIN  01/03/2017 (Originally 01/05/2016)  . INFLUENZA VACCINE  11/22/2016  . OPHTHALMOLOGY EXAM  12/27/2016  . HEMOGLOBIN A1C  12/28/2016  . TETANUS/TDAP  12/04/2023  . DEXA SCAN  Completed  . PNA vac Low Risk Adult  Completed      Plan:    I have personally reviewed and addressed the Medicare Annual Wellness questionnaire and have noted the following in the patient's chart:  A. Medical and social history B. Use of alcohol, tobacco or illicit drugs  C. Current medications and supplements D. Functional ability and status E.  Nutritional status F.  Physical activity G. Advance directives H. List of other physicians I.  Hospitalizations, surgeries, and ER visits in previous 12 months J.  [place] such as hearing and vision if needed, cognitive and depression L. Referrals and appointments  In addition, I have reviewed and discussed with patient certain preventive protocols, quality metrics, and best practice recommendations. A written personalized care plan for preventive services as well as general preventive health recommendations were provided to patient.   Signed,  Tyler Aas, LPN Nurse Health Advisor   MD Recommendations: Due for diabetic foot exam.

## 2016-10-24 NOTE — Patient Instructions (Addendum)
Barbara Santos , Thank you for taking time to come for your Medicare Wellness Visit. I appreciate your ongoing commitment to your health goals. Please review the following plan we discussed and let me know if I can assist you in the future.   Screening recommendations/referrals: Colonoscopy: completed 04/25/2007 Mammogram: completed 12/10/2015 Bone Density: completed 04/24/2012 Recommended yearly ophthalmology/optometry visit for glaucoma screening and checkup Recommended yearly dental visit for hygiene and checkup  Vaccinations: Influenza vaccine: up to date, due 01/2017 Pneumococcal vaccine: pneumovax 23 given today  Tdap vaccine: up to date Shingles vaccine: up to date    Advanced directives: Please bring a copy of your health care power of attorney and living will to the office at your convenience.  Conditions/risks identified: Increase boost for weight gain  Next appointment: Follow up on 10/31/2016 at 9:20am with Dr.Karamalegos. Follow up in one year for your annual wellness exam   Preventive Care 79 Years and Older, Female Preventive care refers to lifestyle choices and visits with your health care provider that can promote health and wellness. What does preventive care include?  A yearly physical exam. This is also called an annual well check.  Dental exams once or twice a year.  Routine eye exams. Ask your health care provider how often you should have your eyes checked.  Personal lifestyle choices, including:  Daily care of your teeth and gums.  Regular physical activity.  Eating a healthy diet.  Avoiding tobacco and drug use.  Limiting alcohol use.  Practicing safe sex.  Taking low-dose aspirin every day.  Taking vitamin and mineral supplements as recommended by your health care provider. What happens during an annual well check? The services and screenings done by your health care provider during your annual well check will depend on your age, overall health,  lifestyle risk factors, and family history of disease. Counseling  Your health care provider may ask you questions about your:  Alcohol use.  Tobacco use.  Drug use.  Emotional well-being.  Home and relationship well-being.  Sexual activity.  Eating habits.  History of falls.  Memory and ability to understand (cognition).  Work and work Statistician.  Reproductive health. Screening  You may have the following tests or measurements:  Height, weight, and BMI.  Blood pressure.  Lipid and cholesterol levels. These may be checked every 5 years, or more frequently if you are over 79 years old.  Skin check.  Lung cancer screening. You may have this screening every year starting at age 79 if you have a 30-pack-year history of smoking and currently smoke or have quit within the past 15 years.  Fecal occult blood test (FOBT) of the stool. You may have this test every year starting at age 79.  Flexible sigmoidoscopy or colonoscopy. You may have a sigmoidoscopy every 5 years or a colonoscopy every 10 years starting at age 79.  Hepatitis C blood test.  Hepatitis B blood test.  Sexually transmitted disease (STD) testing.  Diabetes screening. This is done by checking your blood sugar (glucose) after you have not eaten for a while (fasting). You may have this done every 1-3 years.  Bone density scan. This is done to screen for osteoporosis. You may have this done starting at age 79.  Mammogram. This may be done every 1-2 years. Talk to your health care provider about how often you should have regular mammograms. Talk with your health care provider about your test results, treatment options, and if necessary, the need for more tests.  Vaccines  Your health care provider may recommend certain vaccines, such as:  Influenza vaccine. This is recommended every year.  Tetanus, diphtheria, and acellular pertussis (Tdap, Td) vaccine. You may need a Td booster every 10 years.  Zoster  vaccine. You may need this after age 79.  Pneumococcal 13-valent conjugate (PCV13) vaccine. One dose is recommended after age 79.  Pneumococcal polysaccharide (PPSV23) vaccine. One dose is recommended after age 79. Talk to your health care provider about which screenings and vaccines you need and how often you need them. This information is not intended to replace advice given to you by your health care provider. Make sure you discuss any questions you have with your health care provider. Document Released: 05/07/2015 Document Revised: 12/29/2015 Document Reviewed: 02/09/2015 Elsevier Interactive Patient Education  2017 [place] Prevention in the Home Falls can cause injuries. They can happen to people of all ages. There are many things you can do to make your home safe and to help prevent falls. What can I do on the outside of my home?  Regularly fix the edges of walkways and driveways and fix any cracks.  Remove anything that might make you trip as you walk through a door, such as a raised step or threshold.  Trim any bushes or trees on the path to your home.  Use bright outdoor lighting.  Clear any walking paths of anything that might make someone trip, such as rocks or tools.  Regularly check to see if handrails are loose or broken. Make sure that both sides of any steps have handrails.  Any raised decks and porches should have guardrails on the edges.  Have any leaves, snow, or ice cleared regularly.  Use sand or salt on walking paths during winter.  Clean up any spills in your garage right away. This includes oil or grease spills. What can I do in the bathroom?  Use night lights.  Install grab bars by the toilet and in the tub and shower. Do not use towel bars as grab bars.  Use non-skid mats or decals in the tub or shower.  If you need to sit down in the shower, use a plastic, non-slip stool.  Keep the floor dry. Clean up any water that spills on the  floor as soon as it happens.  Remove soap buildup in the tub or shower regularly.  Attach bath mats securely with double-sided non-slip rug tape.  Do not have throw rugs and other things on the floor that can make you trip. What can I do in the bedroom?  Use night lights.  Make sure that you have a light by your bed that is easy to reach.  Do not use any sheets or blankets that are too big for your bed. They should not hang down onto the floor.  Have a firm chair that has side arms. You can use this for support while you get dressed.  Do not have throw rugs and other things on the floor that can make you trip. What can I do in the kitchen?  Clean up any spills right away.  Avoid walking on wet floors.  Keep items that you use a lot in easy-to-reach places.  If you need to reach something above you, use a strong step stool that has a grab bar.  Keep electrical cords out of the way.  Do not use floor polish or wax that makes floors slippery. If you must use wax, use non-skid floor wax.  Do not have throw rugs and other things on the floor that can make you trip. What can I do with my stairs?  Do not leave any items on the stairs.  Make sure that there are handrails on both sides of the stairs and use them. Fix handrails that are broken or loose. Make sure that handrails are as long as the stairways.  Check any carpeting to make sure that it is firmly attached to the stairs. Fix any carpet that is loose or worn.  Avoid having throw rugs at the top or bottom of the stairs. If you do have throw rugs, attach them to the floor with carpet tape.  Make sure that you have a light switch at the top of the stairs and the bottom of the stairs. If you do not have them, ask someone to add them for you. What else can I do to help prevent falls?  Wear shoes that:  Do not have high heels.  Have rubber bottoms.  Are comfortable and fit you well.  Are closed at the toe. Do not wear  sandals.  If you use a stepladder:  Make sure that it is fully opened. Do not climb a closed stepladder.  Make sure that both sides of the stepladder are locked into place.  Ask someone to hold it for you, if possible.  Clearly mark and make sure that you can see:  Any grab bars or handrails.  First and last steps.  Where the edge of each step is.  Use tools that help you move around (mobility aids) if they are needed. These include:  Canes.  Walkers.  Scooters.  Crutches.  Turn on the lights when you go into a dark area. Replace any light bulbs as soon as they burn out.  Set up your furniture so you have a clear path. Avoid moving your furniture around.  If any of your floors are uneven, fix them.  If there are any pets around you, be aware of where they are.  Review your medicines with your doctor. Some medicines can make you feel dizzy. This can increase your chance of falling. Ask your doctor what other things that you can do to help prevent falls. This information is not intended to replace advice given to you by your health care provider. Make sure you discuss any questions you have with your health care provider. Document Released: 02/04/2009 Document Revised: 09/16/2015 Document Reviewed: 05/15/2014 Elsevier Interactive Patient Education  2017 [employer].

## 2016-10-31 ENCOUNTER — Ambulatory Visit (INDEPENDENT_AMBULATORY_CARE_PROVIDER_SITE_OTHER): Payer: Medicare Other | Admitting: Family Medicine

## 2016-10-31 ENCOUNTER — Other Ambulatory Visit: Payer: Self-pay | Admitting: Family Medicine

## 2016-10-31 ENCOUNTER — Encounter: Payer: Self-pay | Admitting: Family Medicine

## 2016-10-31 VITALS — BP 134/63 | HR 60 | Temp 97.9°F | Resp 16 | Ht 61.0 in | Wt 97.4 lb

## 2016-10-31 DIAGNOSIS — E46 Unspecified protein-calorie malnutrition: Secondary | ICD-10-CM | POA: Insufficient documentation

## 2016-10-31 DIAGNOSIS — R21 Rash and other nonspecific skin eruption: Secondary | ICD-10-CM

## 2016-10-31 DIAGNOSIS — R143 Flatulence: Secondary | ICD-10-CM | POA: Diagnosis not present

## 2016-10-31 DIAGNOSIS — E441 Mild protein-calorie malnutrition: Secondary | ICD-10-CM | POA: Diagnosis not present

## 2016-10-31 DIAGNOSIS — E039 Hypothyroidism, unspecified: Secondary | ICD-10-CM

## 2016-10-31 DIAGNOSIS — E119 Type 2 diabetes mellitus without complications: Secondary | ICD-10-CM

## 2016-10-31 DIAGNOSIS — E7800 Pure hypercholesterolemia, unspecified: Secondary | ICD-10-CM

## 2016-10-31 DIAGNOSIS — E785 Hyperlipidemia, unspecified: Secondary | ICD-10-CM | POA: Insufficient documentation

## 2016-10-31 LAB — POCT GLYCOSYLATED HEMOGLOBIN (HGB A1C): HEMOGLOBIN A1C: 6.6 — AB (ref ?–5.7)

## 2016-10-31 NOTE — Assessment & Plan Note (Addendum)
Well-controlled DM with A1c 6.6 (stable from low to mid 6s) at goal for age 81, within 7-8 Z7Q No known complications or hypoglycemia.  Plan:  1. Remain on diet control only - no medications 2. Encourage improved lifestyle - continue DM diet, continue boost, avoid significant calorie restriction, focus on inc protein and healthy choices, small frequent meals, does not need regular strenuous exercise but recommend frequent daily activity and walking 3. Check CBG, bring log to next visit for review 4. Recommend ASA, ACEi, Statin - defer further discussion until next visit 5. Next visit due POC Urine Microalbumin / DM Foot exam / Advised to schedule DM ophtho exam, send record 6. Follow-up 4 months - future labs ordered

## 2016-10-31 NOTE — Assessment & Plan Note (Signed)
Recent worsening, prior stable chronic problem. Without any GI red flags or other concerns. Mostly bothersome to patient.  Plan: 1. Reviewed recommendations for diet modifications for reduced gas, first can try diet log to determine which foods trigger worse symptoms, advised to eat meals at normal pace not too fast, and smaller frequent meals preferred over current larger dinner 2. Recommend trial OTC probiotic daily, also can try Gas-X PRN 3. Follow-up as needed in future

## 2016-10-31 NOTE — Progress Notes (Signed)
Subjective:    Patient ID: Barbara Santos, female    DOB: 11/03/1935, 81 y.o.   MRN: [id number]  Barbara Santos is a 81 y.o. female presenting on 10/31/2016 for Diabetes (highest BS 162 and lowest 53)  HPI   CHRONIC DM, Type 2: Reports no concerns. She would like to gain a little weight and still control sugar. CBGs: Avg 110-120s, Low 99, High 206. Checks CBGs 1-2. Has detailed CBG log with her. 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 [facility] (Dr Dingeldein), will send DM eye report Denies hypoglycemia, polyuria, visual changes, numbness or tingling.  Excessive Gas - Chronic problem for years, worse over past >1-2 years, intermittent worsening depending on diet at times, but she is not sure on what foods make it worse. - Tried Bean-O without relief, and other OTC no significant changes. Has not been evaluated for this problem before - Admits some burping and frequent passing gas, bothersome to her - Denies indigestion or heartburns, diarrhea, constipation, abdominal pain  Neck Rash - Reports additional complaint today with episode 1 month ago with rash developed under L side of neck, was slightly red appearing initially thought due to sun exposure, had some itching and burning as well, it eventually resolved with topical neosporin cream, now fading but has residual dry skin, still some itching and minimal burning. - Not worsening, still improving - No other areas of rash - S/p Zostavax vaccine in 2012, she never had shingles before, but husband did have it - Denies fevers/chills, other rash, pain, exposure to allergen   Social History  Substance Use Topics  . Smoking status:  Never Smoker  . Smokeless tobacco: Never Used  . Alcohol use No    Review of Systems Per HPI unless specifically indicated above     Objective:    BP 134/63   Pulse 60   Temp 97.9 F (36.6 C) (Oral)   Resp 16   Ht 5\' 1"  (1.549 m)   Wt 97 lb 6.4 oz (44.2 kg)   BMI 18.40 kg/m   Wt Readings from Last 3 Encounters:  10/31/16 97 lb 6.4 oz (44.2 kg)  10/24/16 96 lb 12.8 oz (43.9 kg)  06/27/16 98 lb (44.5 kg)    Physical Exam  Constitutional: She is oriented to person, place, and time. She appears well-developed and well-nourished. No distress.  Well-appearing, comfortable, cooperative  HENT:  Head: Normocephalic and atraumatic.  Mouth/Throat: Oropharynx is clear and moist.  Eyes: Conjunctivae are normal. Right eye exhibits no discharge. Left eye exhibits no discharge.  Neck: Normal range of motion. Neck supple. No thyromegaly present.  Cardiovascular: Normal rate, regular rhythm, normal heart sounds and intact distal pulses.   No murmur heard. Pulmonary/Chest: Effort normal and breath sounds normal. No respiratory distress. She has no wheezes. She has no rales.  Abdominal: Soft. Bowel sounds are normal. She exhibits no distension. There is no tenderness.  Musculoskeletal: Normal range of motion. She exhibits no edema.  Lymphadenopathy:    She has no cervical adenopathy.  Neurological: She is alert and oriented to person, place, and time.  Skin: Skin is warm and dry. Rash (Resolving rash under L neck in dermatomal pattern with now residual  dry flaking non erythematous skin, evidence of prior maculopapular rash) noted. She is not diaphoretic. No erythema.  Psychiatric: She has a normal mood and affect. Her behavior is normal.  Well groomed, good eye contact, normal speech and thoughts  Nursing note and vitals reviewed.  Neck, under Left side, resolving rash     Results for orders placed or performed in visit on 10/31/16  POCT HgB A1C  Result Value Ref Range   Hemoglobin  A1C 6.6 (A) 5.7      Assessment & Plan:   Problem List Items Addressed This Visit    Protein calorie malnutrition ([place])    Stable weight in 4 months, down 2 lbs then up 1 lb, BMI low but appropriate at 18 Improved diet, continue boost daily Limit exercise and avoid lower calories since DM controlled Follow-up as needed, consider possible Mirtazapine for appetite stimulant, otherwise may consider nutritionist, as she would like to avoid medications      Excessive gas    Recent worsening, prior stable chronic problem. Without any GI red flags or other concerns. Mostly bothersome to patient.  Plan: 1. Reviewed recommendations for diet modifications for reduced gas, first can try diet log to determine which foods trigger worse symptoms, advised to eat meals at normal pace not too fast, and smaller frequent meals preferred over current larger dinner 2. Recommend trial OTC probiotic daily, also can try Gas-X PRN 3. Follow-up as needed in future      Controlled type 2 diabetes mellitus without complication ([place]) - Primary    Well-controlled DM with A1c 6.6 (stable from low to mid 6s) at goal for age 71, within 7-8 T1X No known complications or hypoglycemia.  Plan:  1. Remain on diet control only - no medications 2. Encourage improved lifestyle - continue DM diet, continue boost, avoid significant calorie restriction, focus on inc protein and healthy choices, small frequent meals, does not need regular strenuous exercise but recommend frequent daily activity and walking 3. Check CBG, bring log to next visit for review 4. Recommend ASA, ACEi, Statin - defer further discussion until next visit 5. Next visit due POC Urine Microalbumin / DM Foot exam / Advised to schedule DM ophtho exam, send record 6. Follow-up 4 months - future labs ordered      Relevant Orders   POCT HgB A1C (Completed)    Other Visit Diagnoses    Rash and nonspecific skin eruption       Resolving rash under L neck,  possible may have been shingles, now with residual itching and burning, seems unusual for focal sunburn, possible allergic reaction      No orders of the defined types were placed in this encounter.     Follow up plan: Return in about 4 months (around 03/03/2017) for Medicare Physical CPE.  Nobie Putnam, [facility] 10/31/2016, 5:40 PM

## 2016-10-31 NOTE — Assessment & Plan Note (Signed)
Stable weight in 4 months, down 2 lbs then up 1 lb, BMI low but appropriate at 18 Improved diet, continue boost daily Limit exercise and avoid lower calories since DM controlled Follow-up as needed, consider possible Mirtazapine for appetite stimulant, otherwise may consider nutritionist, as she would like to avoid medications

## 2016-10-31 NOTE — Patient Instructions (Addendum)
Thank you for coming to the clinic today.  1.  For excessive gas production - First recommend considering diet options - look at what you are eating, write down on a calendar or list foods that trigger symptoms, if you notice one day is worse than other see what foods trigger it and try to limit them - Try smaller more frequent meals, larger meals produce more gas - Consider a Probiotic or "good bacteria" capsule pill over the counter, once daily with food can help restore normal gut bacteria, as the bad bacteria cause the gas - May try OTC Gas-X as well to see if this helps relieve, only take as needed  2. A1C 6.6, slightly elevated from previous but well in the normal range for your age, I am comfortable with A1c 7 to 8 is okay.  Keep up the good work.  No changes to current medicine  Have Dr Sandra Cockayne send [place] a copy of eye report / diabetic eye exam  You will be due for FASTING BLOOD WORK (no food or drink after midnight before, only water or coffee without cream/sugar on the morning of)  - Please go ahead and schedule a "Lab Only" visit in the morning at the clinic for lab draw in 6 weeks  before next Annual Physical - Make sure Lab Only appointment is at least 1-2 weeks before your next appointment, so that results will be available  For Lab Results, once available within 2-3 days of blood draw, you can can log in to MyChart online to view your results and a brief explanation. Also, we can discuss results at next follow-up visit.  Please schedule a Follow-up Appointment to: Return in about 4 months (around 03/03/2017) for Medicare Physical CPE.  If you have any other questions or concerns, please feel free to call the clinic or send a message through [place]. You may also schedule an earlier appointment if necessary.  Additionally, you may be receiving a survey about your experience at our clinic within a few days to 1 week by e-mail or mail. We value your feedback.  Nobie Putnam, DO [place]

## 2016-12-05 LAB — HM DIABETES EYE EXAM

## 2016-12-06 ENCOUNTER — Other Ambulatory Visit: Payer: Self-pay

## 2016-12-12 ENCOUNTER — Other Ambulatory Visit: Payer: Medicare Other

## 2016-12-12 DIAGNOSIS — E119 Type 2 diabetes mellitus without complications: Secondary | ICD-10-CM

## 2016-12-12 DIAGNOSIS — E441 Mild protein-calorie malnutrition: Secondary | ICD-10-CM

## 2016-12-12 DIAGNOSIS — E039 Hypothyroidism, unspecified: Secondary | ICD-10-CM

## 2016-12-12 DIAGNOSIS — E7800 Pure hypercholesterolemia, unspecified: Secondary | ICD-10-CM

## 2016-12-12 LAB — CBC WITH DIFFERENTIAL/PLATELET
Basophils Absolute: 0 cells/uL (ref 0–200)
Basophils Relative: 0 %
Eosinophils Absolute: 92 cells/uL (ref 15–500)
Eosinophils Relative: 2 %
HCT: 40.2 % (ref 35.0–45.0)
HEMOGLOBIN: 13.4 g/dL (ref 11.7–15.5)
LYMPHS ABS: 2024 {cells}/uL (ref 850–3900)
Lymphocytes Relative: 44 %
MCH: 31.5 pg (ref 27.0–33.0)
MCHC: 33.3 g/dL (ref 32.0–36.0)
MCV: 94.4 fL (ref 80.0–100.0)
MONOS PCT: 8 %
MPV: 9.6 fL (ref 7.5–12.5)
Monocytes Absolute: 368 cells/uL (ref 200–950)
NEUTROS PCT: 46 %
Neutro Abs: 2116 cells/uL (ref 1500–7800)
PLATELETS: 241 10*3/uL (ref 140–400)
RBC: 4.26 MIL/uL (ref 3.80–5.10)
RDW: 13.1 % (ref 11.0–15.0)
WBC: 4.6 10*3/uL (ref 3.8–10.8)

## 2016-12-12 LAB — TSH: TSH: 2.08 mIU/L

## 2016-12-13 LAB — COMPLETE METABOLIC PANEL WITH GFR
ALBUMIN: 4.4 g/dL (ref 3.6–5.1)
ALK PHOS: 95 U/L (ref 33–130)
ALT: 21 U/L (ref 6–29)
AST: 23 U/L (ref 10–35)
BILIRUBIN TOTAL: 0.6 mg/dL (ref 0.2–1.2)
BUN: 13 mg/dL (ref 7–25)
CALCIUM: 10.1 mg/dL (ref 8.6–10.4)
CO2: 27 mmol/L (ref 20–32)
Chloride: 101 mmol/L (ref 98–110)
Creat: 0.66 mg/dL (ref 0.60–0.93)
GFR, EST NON AFRICAN AMERICAN: 85 mL/min (ref >=60)
GFR, Est African American: >89 mL/min (ref >=60)
Glucose, Bld: 109 mg/dL — ABNORMAL HIGH (ref 65–99)
POTASSIUM: 4.6 mmol/L (ref 3.5–5.3)
Sodium: 135 mmol/L (ref 135–146)
Total Protein: 6.7 g/dL (ref 6.1–8.1)

## 2016-12-13 LAB — LIPID PANEL
CHOLESTEROL: 177 mg/dL (ref <200)
HDL: 61 mg/dL (ref >50)
LDL Cholesterol: 101 mg/dL — ABNORMAL HIGH (ref <100)
TRIGLYCERIDES: 76 mg/dL (ref <150)
Total CHOL/HDL Ratio: 2.9 Ratio (ref <5.0)
VLDL: 15 mg/dL (ref <30)

## 2016-12-13 LAB — HEMOGLOBIN A1C
Hgb A1c MFr Bld: 6.1 % — ABNORMAL HIGH (ref <5.7)
MEAN PLASMA GLUCOSE: 128 mg/dL

## 2017-02-27 ENCOUNTER — Ambulatory Visit (INDEPENDENT_AMBULATORY_CARE_PROVIDER_SITE_OTHER): Payer: Medicare Other | Admitting: Family Medicine

## 2017-02-27 ENCOUNTER — Encounter: Payer: Self-pay | Admitting: Family Medicine

## 2017-02-27 VITALS — BP 123/67 | HR 64 | Temp 98.0°F | Resp 16 | Ht 61.0 in | Wt 98.0 lb

## 2017-02-27 DIAGNOSIS — E7800 Pure hypercholesterolemia, unspecified: Secondary | ICD-10-CM

## 2017-02-27 DIAGNOSIS — E039 Hypothyroidism, unspecified: Secondary | ICD-10-CM

## 2017-02-27 DIAGNOSIS — E119 Type 2 diabetes mellitus without complications: Secondary | ICD-10-CM

## 2017-02-27 DIAGNOSIS — E441 Mild protein-calorie malnutrition: Secondary | ICD-10-CM | POA: Diagnosis not present

## 2017-02-27 LAB — POCT UA - MICROALBUMIN: Microalbumin Ur, POC: 0 mg/L

## 2017-02-27 MED ORDER — ROSUVASTATIN CALCIUM 5 MG PO TABS: 5.0000 mg | ORAL_TABLET | Freq: Every day | ORAL | 0 refills | Status: DC

## 2017-02-27 NOTE — Assessment & Plan Note (Signed)
Controlled cholesterol on lifestyle Last lipid panel 11/2016 Calculated ASCVD 10 yr risk score >30%  Plan: 1. Discussion on ASCVD risk reduction options - agree that trial on low dose statin would have benefit that outweighs risk of side effects. Concern with initiating new start ASA - New start Rosuvastatin 5mg  bedtime WEEKLY initially x 1 month, then gradually up to max dose 3x weekly every other night as tolerated 2. Follow-up 3 months adjust statin dose

## 2017-02-27 NOTE — Progress Notes (Signed)
Subjective:    Patient ID: Barbara Santos, female    DOB: [birth year], 81 y.o.   MRN: [id number]  Barbara Santos is a 81 y.o. female presenting on 02/27/2017 for Diabetes   HPI   Here for Annual and Lab Review  Protein Calorie Malnutrition, mild / Low BMI >18.5 - Reports some gradual weight gain over past 3 months, +2-3 lbs. She is trying to gain and maintain weight goal >100. Tries to eat mostly home cooked foods, appetite is good, also consuming daily boost, lower sugar option  CHRONIC DM, Type 2: Reports no concerns. CBGs: AM readings 110-130, avg, post prandial 120-140, and sometimes evening post prandial 200 Meds: None Currently NOT on ACEi / ARB - completed urine microalbumin today Lifestyle: - Diet (Drinks mostly water, limits carbs and sugar, drinks boost daily, low sugar option 4g)  - Exercise (walking and household activities) - Last DM Eye Exam 12/05/16, [facility] Denies hypoglycemia, polyuria, visual changes, numbness or tingling.  HYPERLIPIDEMIA: - Reports no concerns. Last lipid panel, 11/2016 well controlled on lifestyle - Never on Statin. She has concerns about side effects, husband used to take in past. - Not taking ASA either  HYPOTHYROIDISM - Reports chronic history of hypothyroidism on Levothyroxine for while. Unchanged dose. - Last TSH normal 11/2016. Continues to take Levothyroxine 44mcg daily  Health Maintenance: - Breast CA Screening: UTD - gets mammogram q 2 years due to insurance coverage. Last mammogram result 12/09/15 negative done at [facility]. Currently asymptomatic. No prior history abnormal mammogram. Family history of breast cancer older sister relatively recent dx, s/p chemotherapy. - UTD Flu Shot, Pneumonia vaccine  Depression screen [facility] 2/9 10/24/2016 06/27/2016 02/24/2016  Decreased Interest 0 0 0  Down, Depressed, Hopeless 0 0 0  PHQ - 2 Score 0 0 0  Altered sleeping - - -  Tired, decreased energy - - -  Change in  appetite - - -  Feeling bad or failure about yourself  - - -  Trouble concentrating - - -  Moving slowly or fidgety/restless - - -  Suicidal thoughts - - -  PHQ-9 Score - - -  Difficult doing work/chores - - -    Past Medical History:  Diagnosis Date  . Arthritis   . Diabetes ([place])   . Glaucoma   . Hypothyroidism   . Insomnia   . Left hip pain   . Protein calorie malnutrition ([place])    Past Surgical History:  Procedure Laterality Date  . TOTAL ABDOMINAL HYSTERECTOMY  2009   Social History   Socioeconomic History  . Marital status: Married    Spouse name: Not on file  . Number of children: Not on file  . Years of education: Not on file  . Highest education level: Not on file  Social Needs  . Financial resource strain: Not on file  . Food insecurity - worry: Not on file  . Food insecurity - inability: Not on file  . Transportation needs - medical: Not on file  . Transportation needs - non-medical: Not on file  Occupational History  . Not on file  Tobacco Use  . Smoking status: Never Smoker  . Smokeless tobacco: Never Used  Substance and Sexual Activity  . Alcohol use: No    Alcohol/week: 0.0 oz  . Drug use: No  . Sexual activity: Not on file  Other Topics Concern  . Not on file  Social History Narrative  . Not on file   Family History  Problem Relation Age of Onset  . Diabetes Mellitus II Mother   . Stroke Father   . Stroke Maternal Grandmother   . Heart disease Paternal Grandfather   . Breast cancer Sister    Current Outpatient Medications on File Prior to Visit  Medication Sig  . Cholecalciferol (VITAMIN D) 2000 UNITS tablet Take 2,000 Units by mouth daily.  . diphenhydrAMINE (BENADRYL) 25 MG tablet Take 25 mg by mouth every 6 (six) hours as needed.  . dorzolamide-timolol (COSOPT) 22.3-6.8 MG/ML ophthalmic solution Place 1 drop into both eyes 2 (two) times daily.   Marland Kitchen glucose blood (ONETOUCH VERIO) test strip USE AS DIRECTED ONCE DAILY DX:E11.9  .  lactose free nutrition (BOOST) LIQD Take 237 mLs by mouth 3 (three) times daily between meals.  Marland Kitchen levothyroxine (SYNTHROID, LEVOTHROID) 50 MCG tablet TAKE 1 TABLET BY MOUTH EVERY DAY BEFORE BREAKFAST  . Multiple Vitamin (MULTIVITAMIN) tablet Take 1 tablet by mouth daily.  Glory Rosebush DELICA LANCETS FINE MISC 1 each by Other route 3 (three) times daily.   No current facility-administered medications on file prior to visit.     Review of Systems  Constitutional: Negative for activity change, appetite change, chills, diaphoresis, fatigue and fever.  HENT: Negative for congestion, hearing loss and sinus pressure.   Eyes: Negative for visual disturbance.  Respiratory: Negative for apnea, cough, choking, chest tightness, shortness of breath and wheezing.   Cardiovascular: Negative for chest pain, palpitations and leg swelling.  Gastrointestinal: Negative for abdominal pain, anal bleeding, blood in stool, constipation, diarrhea, nausea and vomiting.  Endocrine: Negative for cold intolerance and polyuria.  Genitourinary: Negative for decreased urine volume, difficulty urinating, dysuria, frequency and hematuria.  Musculoskeletal: Negative for arthralgias and neck pain.  Skin: Negative for rash.  Allergic/Immunologic: Negative for environmental allergies.  Neurological: Negative for dizziness, weakness, light-headedness, numbness and headaches.  Hematological: Negative for adenopathy.  Psychiatric/Behavioral: Negative for behavioral problems, dysphoric mood and sleep disturbance. The patient is not nervous/anxious.    Per HPI unless specifically indicated above     Objective:    BP 123/67 (BP Location: Left Arm, Patient Position: Sitting, Cuff Size: Normal)   Pulse 64   Temp 98 F (36.7 C) (Oral)   Resp 16   Ht 5\' 1"  (1.549 m)   Wt 98 lb (44.5 kg)   BMI 18.52 kg/m   Wt Readings from Last 3 Encounters:  02/27/17 98 lb (44.5 kg)  10/31/16 97 lb 6.4 oz (44.2 kg)  10/24/16 96 lb 12.8 oz  (43.9 kg)    Physical Exam  Constitutional: She is oriented to person, place, and time. She appears well-developed and well-nourished. No distress.  Well but thin appearing 81 year old female, comfortable, cooperative  HENT:  Head: Normocephalic and atraumatic.  Mouth/Throat: Oropharynx is clear and moist.  Frontal / maxillary sinuses non-tender. Nares patent without purulence or edema. Bilateral TMs clear without erythema, effusion or bulging. Oropharynx clear without erythema, exudates, edema or asymmetry.  Eyes: Conjunctivae and EOM are normal. Pupils are equal, round, and reactive to light. Right eye exhibits no discharge. Left eye exhibits no discharge.  Neck: Normal range of motion. Neck supple. No thyromegaly present.  Cardiovascular: Normal rate, regular rhythm, normal heart sounds and intact distal pulses.  No murmur heard. Pulmonary/Chest: Effort normal and breath sounds normal. No respiratory distress. She has no wheezes. She has no rales.  Abdominal: Soft. Bowel sounds are normal. She exhibits no distension and no mass. There is no tenderness.  Musculoskeletal: Normal  range of motion. She exhibits no edema or tenderness.  Upper / Lower Extremities: - Normal muscle tone, strength bilateral upper extremities 5/5, lower extremities 5/5  Lymphadenopathy:    She has no cervical adenopathy.  Neurological: She is alert and oriented to person, place, and time.  Distal sensation intact to light touch all extremities  Skin: Skin is warm and dry. No rash noted. She is not diaphoretic. No erythema.  Psychiatric: She has a normal mood and affect. Her behavior is normal.  Well groomed, good eye contact, normal speech and thoughts  Nursing note and vitals reviewed.    Diabetic Foot Exam - Simple   Simple Foot Form Diabetic Foot exam was performed with the following findings:  Yes 02/27/2017 11:14 AM  Visual Inspection No deformities, no ulcerations, no other skin breakdown bilaterally:   Yes Sensation Testing Intact to touch and monofilament testing bilaterally:  Yes Pulse Check Posterior Tibialis and Dorsalis pulse intact bilaterally:  Yes Comments      Chemistry      Component Value Date/Time   NA 135 12/11/2016 1208   NA 142 01/12/2015 0835   K 4.6 12/11/2016 1208   CL 101 12/11/2016 1208   CO2 27 12/11/2016 1208   BUN 13 12/11/2016 1208   BUN 18 01/12/2015 0835   CREATININE 0.66 12/11/2016 1208   GLU 103 09/09/2014      Component Value Date/Time   CALCIUM 10.1 12/11/2016 1208   ALKPHOS 95 12/11/2016 1208   AST 23 12/11/2016 1208   ALT 21 12/11/2016 1208   BILITOT 0.6 12/11/2016 1208   BILITOT 0.5 01/12/2015 0835     Lipid Panel     Component Value Date/Time   CHOL 177 12/11/2016 1208   CHOL 176 01/12/2015 0835   TRIG 76 12/11/2016 1208   HDL 61 12/11/2016 1208   HDL 54 01/12/2015 0835   CHOLHDL 2.9 12/11/2016 1208   VLDL 15 12/11/2016 1208   LDLCALC 101 (H) 12/11/2016 1208   LDLCALC 106 (H) 01/12/2015 0835   Recent Labs    06/27/16 0001 10/31/16 1009 12/11/16 1208  HGBA1C 6.3* 6.6* 6.1*    Results for orders placed or performed in visit on 02/27/17  POCT UA - Microalbumin  Result Value Ref Range   Microalbumin Ur, POC 0 mg/L   Creatinine, POC  mg/dL   Albumin/Creatinine Ratio, Urine, POC        Assessment & Plan:   Problem List Items Addressed This Visit    Controlled type 2 diabetes mellitus without complication ([place]) - Primary    Well-controlled DM with A1c 6.1 (improved from 6.6, remains < 7) No known complications or hypoglycemia.  Plan:  1. Remain on diet control only - no medications 2. Encourage improved lifestyle - continue DM diet, continue boost, avoid significant calorie restriction, focus on inc protein and healthy choices, small frequent meals, does not need regular strenuous exercise but recommend frequent daily activity and walking 3. Check CBG, bring log to next visit for review 4. Check microalbumin today - 0,  continue yearly defer ACEi/ARB 5. Start Statin low dose intermittent, see HLD A&P - defer ASA 6. DM Foot Exam today / UTD DM Eye 7. Follow-up 3 months - DM A1c and check progress on Statin      Relevant Medications   rosuvastatin (CRESTOR) 5 MG tablet   Other Relevant Orders   POCT UA - Microalbumin (Completed)   Elevated LDL cholesterol level    Controlled cholesterol on lifestyle Last  lipid panel 11/2016 Calculated ASCVD 10 yr risk score >30%  Plan: 1. Discussion on ASCVD risk reduction options - agree that trial on low dose statin would have benefit that outweighs risk of side effects. Concern with initiating new start ASA - New start Rosuvastatin 5mg  bedtime WEEKLY initially x 1 month, then gradually up to max dose 3x weekly every other night as tolerated 2. Follow-up 3 months adjust statin dose      Relevant Medications   rosuvastatin (CRESTOR) 5 MG tablet   Hypothyroidism    Stable, controlled hypothyroidism TSH normal, unchanged in past few years Continue Levothyroxine 63mcg daily, in future if still problem with wt gain may consider adjust dose to lower to see if helps      Protein calorie malnutrition ([facility])    Improved gradual weight gain +2-3 lbs in 3 months BMI improved >18.5 Improved diet, continue boost daily Limit exercise and avoid lower calories since DM controlled Follow-up as needed, consider possible Mirtazapine for appetite stimulant, otherwise may consider nutritionist, as she would like to avoid medications         Meds ordered this encounter  Medications  . rosuvastatin (CRESTOR) 5 MG tablet    Sig: Take 1 tablet (5 mg total) at bedtime by mouth.    Dispense:  30 tablet    Refill:  0    Follow up plan: Return in about 3 months (around 05/30/2017) for DM A1c, f/u statin.  Nobie Putnam, [facility] 02/27/2017, 8:58 PM

## 2017-02-27 NOTE — Patient Instructions (Addendum)
Thank you for coming to the clinic today.  1. Overall labs are great. Sugar is well controlled on average, A1c 6.1  No changes to diabetes management  2. Due to elevated cardiovascular risk - recommend starting Rosuvastatin (generic crestor) 5mg  at bedtime ONCE WEEKLY, if tolerating well without muscle aches or joint pain gradually increase dose every month up to TWICE WEEKLY then max dose of 3 times weekly or every other night.  Will not refill until next visit  Please schedule a Follow-up Appointment to: Return in about 3 months (around 05/30/2017) for DM A1c, f/u statin.  If you have any other questions or concerns, please feel free to call the clinic or send a message through [place]. You may also schedule an earlier appointment if necessary.  Additionally, you may be receiving a survey about your experience at our clinic within a few days to 1 week by e-mail or mail. We value your feedback.  Nobie Putnam, DO [place]

## 2017-02-27 NOTE — Assessment & Plan Note (Signed)
Well-controlled DM with A1c 6.1 (improved from 6.6, remains < 7) No known complications or hypoglycemia.  Plan:  1. Remain on diet control only - no medications 2. Encourage improved lifestyle - continue DM diet, continue boost, avoid significant calorie restriction, focus on inc protein and healthy choices, small frequent meals, does not need regular strenuous exercise but recommend frequent daily activity and walking 3. Check CBG, bring log to next visit for review 4. Check microalbumin today - 0, continue yearly defer ACEi/ARB 5. Start Statin low dose intermittent, see HLD A&P - defer ASA 6. DM Foot Exam today / UTD DM Eye 7. Follow-up 3 months - DM A1c and check progress on Statin

## 2017-02-27 NOTE — Assessment & Plan Note (Signed)
Improved gradual weight gain +2-3 lbs in 3 months BMI improved >18.5 Improved diet, continue boost daily Limit exercise and avoid lower calories since DM controlled Follow-up as needed, consider possible Mirtazapine for appetite stimulant, otherwise may consider nutritionist, as she would like to avoid medications

## 2017-02-27 NOTE — Assessment & Plan Note (Signed)
Stable, controlled hypothyroidism TSH normal, unchanged in past few years Continue Levothyroxine 39mcg daily, in future if still problem with wt gain may consider adjust dose to lower to see if helps

## 2017-03-26 ENCOUNTER — Other Ambulatory Visit: Payer: Self-pay | Admitting: Family Medicine

## 2017-03-26 DIAGNOSIS — E7800 Pure hypercholesterolemia, unspecified: Secondary | ICD-10-CM

## 2017-03-27 ENCOUNTER — Telehealth: Payer: Self-pay | Admitting: Family Medicine

## 2017-03-27 DIAGNOSIS — E7800 Pure hypercholesterolemia, unspecified: Secondary | ICD-10-CM

## 2017-03-27 MED ORDER — ROSUVASTATIN CALCIUM 5 MG PO TABS: 5.0000 mg | ORAL_TABLET | ORAL | 3 refills | Status: DC

## 2017-03-27 NOTE — Telephone Encounter (Signed)
Refilled Rosuvastatin 5mg , for intermittent dosing 3 times weekly, sent to [facility]  Nobie Putnam, [facility] 03/27/2017, 11:26 AM

## 2017-03-27 NOTE — Telephone Encounter (Signed)
error 

## 2017-03-27 NOTE — Telephone Encounter (Signed)
Pt needs a refill of rosuvastatin sent to [facility] in [place]

## 2017-04-26 ENCOUNTER — Encounter: Payer: Self-pay | Admitting: Family Medicine

## 2017-04-26 ENCOUNTER — Ambulatory Visit (INDEPENDENT_AMBULATORY_CARE_PROVIDER_SITE_OTHER): Payer: Medicare Other | Admitting: Family Medicine

## 2017-04-26 VITALS — BP 138/68 | HR 70 | Temp 98.2°F | Resp 16 | Ht 61.0 in | Wt 99.6 lb

## 2017-04-26 DIAGNOSIS — J019 Acute sinusitis, unspecified: Secondary | ICD-10-CM

## 2017-04-26 MED ORDER — IPRATROPIUM BROMIDE 0.06 % NA SOLN: 2.0000 | Freq: Four times a day (QID) | NASAL | 0 refills | Status: DC

## 2017-04-26 MED ORDER — BENZONATATE 100 MG PO CAPS: 100.0000 mg | ORAL_CAPSULE | Freq: Three times a day (TID) | ORAL | 0 refills | Status: DC | PRN

## 2017-04-26 NOTE — Patient Instructions (Addendum)
Thank you for coming to the office today.  1. 1. It sounds like you have persistent Sinus Congestion or "Rhinosinusitis" - I do not think that this is a Bacterial Sinus Infection. Usually these are caused by Viruses or Allergies, and will run it's course in about 7 to 10 days. - No antibiotics are needed - Start Atrovent nasal spray decongestant 2 sprays in each nostril up to 4 times daily for 7 days - Start Tessalon Perls take 1 capsule up to 3 times a day as needed for cough - Finish Mucinex-DM for 2-3 days then stop - may try regular mucinex in future if still congested - If not improved after 1-2 week or have lingering symptoms sinus pressure may start Flonase 2 sprays in each nostril daily for next 4-6 weeks, then you may stop and use seasonally or as needed (call if need new rx flonase) - Improve hydration by drinking plenty of clear fluids (water, gatorade) to reduce secretions and thin congestion - Congestion draining down throat can cause irritation. May try warm herbal tea with honey, cough drops - Can take Tylenol or Ibuprofen as needed for fevers  Call within 24 hours or next week if worsening concern may need antibiotic  If you develop persistent fever >101F for at least 3 consecutive days, headaches with sinus pain or pressure or persistent earache, please schedule a follow-up evaluation within next few days to week.   Please schedule a Follow-up Appointment to: Return in about 1 week (around 05/03/2017), or if symptoms worsen or fail to improve, for sinusitis.    If you have any other questions or concerns, please feel free to call the office or send a message through [place]. You may also schedule an earlier appointment if necessary.  Additionally, you may be receiving a survey about your experience at our office within a few days to 1 week by e-mail or mail. We value your feedback.  Nobie Putnam, DO [place]

## 2017-04-26 NOTE — Progress Notes (Signed)
Subjective:    Patient ID: Barbara Santos, female    DOB: 1935-12-10, 82 y.o.   MRN: [id number]  Barbara Santos is a 82 y.o. female presenting on 04/26/2017 for Cough (HA, chills, Left ear pain onset 4 days but getting worst from past 2 days)  Patient presents for a same day appointment.  HPI   ACUTE RHINOSINUSITIS Reports symptoms started about 4 days ago with dry hacking cough, feels chest congestion difficulty getting it to clear. She has some sinus pressure and headache and nasal congestion - Tried taking Mucinex-DM twice daily, OTC Cough Syrup - mild relief - No known sick contacts - Admits some intermittent chills without documented fever - Admits sore throat, dry worse at night - tries [employer] with relief - Admits hoarse voice - Denies body aches, nausea vomiting, diarrhea, ear pain  Health Maintenance: UTD Flu Shot  Depression screen [facility] 2/9 10/24/2016 06/27/2016 02/24/2016  Decreased Interest 0 0 0  Down, Depressed, Hopeless 0 0 0  PHQ - 2 Score 0 0 0  Altered sleeping - - -  Tired, decreased energy - - -  Change in appetite - - -  Feeling bad or failure about yourself  - - -  Trouble concentrating - - -  Moving slowly or fidgety/restless - - -  Suicidal thoughts - - -  PHQ-9 Score - - -  Difficult doing work/chores - - -    Social History   Tobacco Use  . Smoking status: Never Smoker  . Smokeless tobacco: Never Used  Substance Use Topics  . Alcohol use: No    Alcohol/week: 0.0 oz  . Drug use: No    Review of Systems Per HPI unless specifically indicated above     Objective:    BP 138/68 (BP Location: Left Arm, Cuff Size: Normal)   Pulse 70   Temp 98.2 F (36.8 C) (Oral)   Resp 16   Ht 5\' 1"  (1.549 m)   Wt 99 lb 9.6 oz (45.2 kg)   SpO2 100%   BMI 18.82 kg/m   Wt Readings from Last 3 Encounters:  04/26/17 99 lb 9.6 oz (45.2 kg)  02/27/17 98 lb (44.5 kg)  10/31/16 97 lb 6.4 oz (44.2 kg)    Physical Exam  Constitutional:  She is oriented to person, place, and time. She appears well-developed and well-nourished. No distress.  Mostly well appearing slightly tired, comfortable, cooperative  HENT:  Head: Normocephalic and atraumatic.  Mouth/Throat: Oropharynx is clear and moist.  Frontal / maxillary sinuses non-tender. Nares with some turbinate edema and congestion without purulence. Bilateral TMs clear with minimal L>R clear effusion without erythema or bulging. Oropharynx with post nasal drainage without erythema, exudates, edema or asymmetry.  Eyes: Conjunctivae are normal. Right eye exhibits no discharge. Left eye exhibits no discharge.  Neck: Normal range of motion. Neck supple.  Cardiovascular: Normal rate, regular rhythm, normal heart sounds and intact distal pulses.  No murmur heard. Pulmonary/Chest: Effort normal and breath sounds normal. No respiratory distress. She has no wheezes. She has no rales.  Good air movement. No focal abnormality  Musculoskeletal: Normal range of motion. She exhibits no edema.  Lymphadenopathy:    She has no cervical adenopathy.  Neurological: She is alert and oriented to person, place, and time.  Skin: Skin is warm and dry. No rash noted. She is not diaphoretic. No erythema.  Psychiatric: Her behavior is normal.  Nursing note and vitals reviewed.    Results for orders  placed or performed in visit on 02/27/17  POCT UA - Microalbumin  Result Value Ref Range   Microalbumin Ur, POC 0 mg/L   Creatinine, POC  mg/dL   Albumin/Creatinine Ratio, Urine, POC        Assessment & Plan:   Problem List Items Addressed This Visit    None    Visit Diagnoses    Acute rhinosinusitis    -  Primary  Consistent with acute rhinosinusitis, likely initially viral URI vs allergic rhinitis component with without evidence of acute bacterial infection.  Plan: 1. Reassurance, likely self-limited - no indication for antibiotics at this time - Start Atrovent nasal spray decongestant 2 sprays  in each nostril up to 4 times daily for 7 days - Start [employer] take 1 capsule up to 3 times a day as needed for cough - Coupon mucinex given - If not improved 1-2 week can add Flonase OTC or rx to avoid worsening continued drainage 2. Supportive care with nasal saline OTC, hydration 3. Return criteria reviewed     Relevant Medications   ipratropium (ATROVENT) 0.06 % nasal spray   benzonatate (TESSALON) 100 MG capsule      Meds ordered this encounter  Medications  . ipratropium (ATROVENT) 0.06 % nasal spray    Sig: Place 2 sprays into both nostrils 4 (four) times daily. For up to 5-7 days then stop.    Dispense:  15 mL    Refill:  0  . benzonatate (TESSALON) 100 MG capsule    Sig: Take 1 capsule (100 mg total) by mouth 3 (three) times daily as needed for cough.    Dispense:  30 capsule    Refill:  0     Follow up plan: Return in about 1 week (around 05/03/2017), or if symptoms worsen or fail to improve, for sinusitis.  Nobie Putnam, [facility] 04/26/2017, 4:50 PM

## 2017-05-29 DIAGNOSIS — H40009 Preglaucoma, unspecified, unspecified eye: Secondary | ICD-10-CM | POA: Diagnosis not present

## 2017-06-05 ENCOUNTER — Ambulatory Visit: Payer: Self-pay | Admitting: Family Medicine

## 2017-06-05 DIAGNOSIS — H40009 Preglaucoma, unspecified, unspecified eye: Secondary | ICD-10-CM | POA: Diagnosis not present

## 2017-06-12 ENCOUNTER — Ambulatory Visit (INDEPENDENT_AMBULATORY_CARE_PROVIDER_SITE_OTHER): Payer: Medicare Other | Admitting: Family Medicine

## 2017-06-12 ENCOUNTER — Ambulatory Visit: Payer: Self-pay | Admitting: Family Medicine

## 2017-06-12 ENCOUNTER — Other Ambulatory Visit: Payer: Self-pay

## 2017-06-12 ENCOUNTER — Encounter: Payer: Self-pay | Admitting: Family Medicine

## 2017-06-12 VITALS — BP 137/59 | HR 63 | Temp 97.6°F | Ht 61.0 in | Wt 96.0 lb

## 2017-06-12 DIAGNOSIS — N644 Mastodynia: Secondary | ICD-10-CM | POA: Diagnosis not present

## 2017-06-12 DIAGNOSIS — E441 Mild protein-calorie malnutrition: Secondary | ICD-10-CM | POA: Diagnosis not present

## 2017-06-12 DIAGNOSIS — E7800 Pure hypercholesterolemia, unspecified: Secondary | ICD-10-CM | POA: Diagnosis not present

## 2017-06-12 DIAGNOSIS — E039 Hypothyroidism, unspecified: Secondary | ICD-10-CM | POA: Diagnosis not present

## 2017-06-12 DIAGNOSIS — E119 Type 2 diabetes mellitus without complications: Secondary | ICD-10-CM

## 2017-06-12 LAB — POCT GLYCOSYLATED HEMOGLOBIN (HGB A1C): HEMOGLOBIN A1C: 6.6 — AB (ref ?–5.7)

## 2017-06-12 MED ORDER — ROSUVASTATIN CALCIUM 5 MG PO TABS: 5.0000 mg | ORAL_TABLET | Freq: Every day | ORAL | 3 refills | Status: DC

## 2017-06-12 MED ORDER — LEVOTHYROXINE SODIUM 50 MCG PO TABS: ORAL_TABLET | ORAL | 3 refills | Status: DC

## 2017-06-12 NOTE — Patient Instructions (Addendum)
Thank you for coming to the office today.  1. A1c 6.6, mildly elevated from prior 6.1, but you have had 6.6 in past - I am encouraged that this will stabilize, and it should not keep increasing in future, goal is within 6 to 7. It is acceptable up to 7.5 as well based on recent guidelines  - Keep trying to work on lifestyle improvements  - No medicine at this time  2.  Increased rosuvastatin 5mg  to now NIGHTLY every night - 90 day supply sent  Refilled Levothyroxine  3.  For Diagnostic Mammogram with R breast pain - stay tuned for appointment  Orders have been placed including Ultrasound R breast  [facility] [place], [place] [address] Phone: [phone]   Please schedule a Follow-up Appointment to: Return in about 3 months (around 09/09/2017) for DM A1c, Weight check.    If you have any other questions or concerns, please feel free to call the office or send a message through [place]. You may also schedule an earlier appointment if necessary.  Additionally, you may be receiving a survey about your experience at our office within a few days to 1 week by e-mail or mail. We value your feedback.  Nobie Putnam, DO [place]

## 2017-06-12 NOTE — Assessment & Plan Note (Signed)
Fluctuating wt now down 2-3 lbs BMI still >18 Improved diet, continue boost daily Limit exercise and avoid lower calories since DM controlled Follow-up as needed, consider possible Mirtazapine for appetite stimulant, otherwise may consider nutritionist, as she would like to avoid medications

## 2017-06-12 NOTE — Progress Notes (Signed)
Subjective:    Patient ID: Barbara Santos, female    DOB: 1935/08/21, 82 y.o.   MRN: [id number]  Barbara Santos is a 82 y.o. female presenting on 06/12/2017 for Diabetes   HPI   Protein Calorie Malnutrition, mild / Low BMI >18.5 - Today reports appetite is good still, trying to improve weight. Last visit wt 99, now down to 96, still working on improving this goal 105 lbs - Eats mostly home cooked foods, appetite is good, also consuming daily boost, lower sugar option  CHRONIC DM, Type 2: Reports some concern that her sugar CBG readings seem to be gradually increased from last time. CBGs: overal CBG readings 130-150, avg,  (x 1 daily, variable times of day) Meds:None CurrentlyNOT onACEi / ARB Lifestyle: - Diet (Drinks mostly water, limits carbs and sugar, drinks boost daily, low sugar option 4g)  - Exercise (walking and household activities) - Last DM Eye Exam 12/05/16,  Eye Denies hypoglycemia, polyuria, visual changes, numbness or tingling.  HYPERLIPIDEMIA: - Reports no concerns. Last lipid panel, 11/2016 well controlled on lifestyle - She started low dose statin last visit for annual in 2018, on Rosuvastatin 5mg  at night 3 x weekly M-W-F only, with good results, tolerates well without myalgias or muscle aches, now asking to inc dose to nightly trial, cost of med will be better  HYPOTHYROIDISM - Reports chronic history of hypothyroidism on Levothyroxine for while. Last TSH normal 11/2016. Continues to take Levothyroxine 73mcg daily - Refilled today  Right Breast Pain Reports symptoms onset with R breast pain onset over past 2 weeks, in past has had some episodic intermittent pains not quite the same. She has done self breast exam and has not noticed any lump or bump or nipple discharge. Today requesting new mammogram diagnostic Last mammogram result 12/09/15 negative done at [facility]. No prior history abnormal mammogram. Family history of breast  cancer older sister relatively recent dx, s/p chemotherapy.    Depression screen [facility] 2/9 06/12/2017 10/24/2016 06/27/2016  Decreased Interest 0 0 0  Down, Depressed, Hopeless 0 0 0  PHQ - 2 Score 0 0 0  Altered sleeping 0 - -  Tired, decreased energy 0 - -  Change in appetite 0 - -  Feeling bad or failure about yourself  0 - -  Trouble concentrating 0 - -  Moving slowly or fidgety/restless 0 - -  Suicidal thoughts 0 - -  PHQ-9 Score 0 - -  Difficult doing work/chores Not difficult at all - -    Social History   Tobacco Use  . Smoking status: Never Smoker  . Smokeless tobacco: Never Used  Substance Use Topics  . Alcohol use: No    Alcohol/week: 0.0 oz  . Drug use: No    Review of Systems Per HPI unless specifically indicated above     Objective:    BP (!) 137/59 (BP Location: Right Arm, Patient Position: Sitting, Cuff Size: Normal)   Pulse 63   Temp 97.6 F (36.4 C)   Ht 5\' 1"  (1.549 m)   Wt 96 lb (43.5 kg)   BMI 18.14 kg/m   Wt Readings from Last 3 Encounters:  06/12/17 96 lb (43.5 kg)  04/26/17 99 lb 9.6 oz (45.2 kg)  02/27/17 98 lb (44.5 kg)    Physical Exam  Constitutional: She is oriented to person, place, and time. She appears well-developed and well-nourished. No distress.  Well-appearing still mildly thin but near baseline, comfortable, cooperative  HENT:  Head:  Normocephalic and atraumatic.  Mouth/Throat: Oropharynx is clear and moist.  Eyes: Conjunctivae are normal. Right eye exhibits no discharge. Left eye exhibits no discharge.  Neck: Normal range of motion. Neck supple. No thyromegaly present.  Cardiovascular: Normal rate, regular rhythm, normal heart sounds and intact distal pulses.  No murmur heard. Pulmonary/Chest: Effort normal and breath sounds normal. No respiratory distress. She has no wheezes. She has no rales.    Musculoskeletal: Normal range of motion. She exhibits no edema.  Lymphadenopathy:    She has no cervical adenopathy.    Neurological: She is alert and oriented to person, place, and time.  Skin: Skin is warm and dry. No rash noted. She is not diaphoretic. No erythema.  Psychiatric: She has a normal mood and affect. Her behavior is normal.  Well groomed, good eye contact, normal speech and thoughts  Nursing note and vitals reviewed.   Patient declined formal breast exam since she does self exams.  Recent Labs    10/31/16 1009 12/11/16 1208 06/12/17 0842  HGBA1C 6.6* 6.1* 6.6*    Results for orders placed or performed in visit on 06/12/17  POCT HgB A1C  Result Value Ref Range   Hemoglobin A1C 6.6 (A) 5.7      Assessment & Plan:   Problem List Items Addressed This Visit    Controlled type 2 diabetes mellitus without complication ([place]) - Primary    Previously well controlled DM, now mild increased A1c up to 6.6, from prior 6.1 - 6.6. Still at goal < 7-8 for age Likely some elevated sugar with patient trying to maintain appropriate weight No known complications or hypoglycemia. Last urine microalbumin 0 (02/2017)  Plan:  1. Remain on diet control only - still no rec for medication 2. Encourage improved lifestyle - continue DM diet, continue boost, avoid significant calorie restriction, focus on inc protein and healthy choices, small frequent meals, not need regular strenuous exercise but recommend frequent daily activity and walking 3. Check CBG, bring log to next visit for review 4. Not on ACEi/ARB - negative urine microalbumin 5. Continue Statin - inc dose now to nightly instead of TIW (M-W-F) / Defer ASA prior easy bleeding 6. Follow-up 3 months - DM A1c wt check      Relevant Medications   rosuvastatin (CRESTOR) 5 MG tablet   Other Relevant Orders   POCT HgB A1C (Completed)   Elevated LDL cholesterol level    Controlled cholesterol on statin and lifestyle Last lipid panel 11/2016 Calculated ASCVD 10 yr risk score >30%  Plan: 1. INCREASE Rosuvastatin 5mg  from TIW (M-W-F) to nightly  since tolerating well - further reduce ASCVD risk in future - Defer ASA - history of easy bleeding on trial years ago - Follow-up yearly lipids      Relevant Medications   rosuvastatin (CRESTOR) 5 MG tablet   Hypothyroidism    Stable, controlled hypothyroidism Last TSH normal Refilled Levothyroxine 68mcg daily, in future if still problem with wt gain may consider adjust dose to lower to see if helps      Relevant Medications   levothyroxine (SYNTHROID, LEVOTHROID) 50 MCG tablet   Protein calorie malnutrition ([facility])    Fluctuating wt now down 2-3 lbs BMI still >18 Improved diet, continue boost daily Limit exercise and avoid lower calories since DM controlled Follow-up as needed, consider possible Mirtazapine for appetite stimulant, otherwise may consider nutritionist, as she would like to avoid medications       Other Visit Diagnoses    Breast  pain, right       New complaint seems more persistent R breast 9 o clock to 12 o clock region intermittent, no other focal changes, proceed with dx mammo and [place] - Staff to contact [facility] to schedule diagnostic imaging today, patient notified - apt within 1 week   Relevant Orders   MM DIAG BREAST TOMO BILATERAL   US [place] ordered this encounter  Medications  . rosuvastatin (CRESTOR) 5 MG tablet    Sig: Take 1 tablet (5 mg total) by mouth at bedtime.    Dispense:  90 tablet    Refill:  3    Increased frequency, #90 day supply  . levothyroxine (SYNTHROID, LEVOTHROID) 50 MCG tablet    Sig: TAKE 1 TABLET BY MOUTH EVERY DAY BEFORE BREAKFAST    Dispense:  90 tablet    Refill:  3   Orders Placed This Encounter  Procedures  . MM DIAG BREAST TOMO BILATERAL    Standing Status:   Future    Standing Expiration Date:   08/11/2018    Order Specific Question:   Reason for Exam (SYMPTOM  OR DIAGNOSIS REQUIRED)    Answer:   Intermittent R breast pains from 9 to 12 o'clock without palpable mass/lump or  skin changes, onset >2 weeks with daily pain, fam history sister with breast cancer, no prior abnormal, last mammo 8.2017    Order Specific Question:   Preferred imaging location?    Answer:   [facility]  . US BREAST LTD UNI RIGHT INC AXILLA    Standing Status:   Future    Standing Expiration Date:   08/11/2018    Order Specific Question:   Reason for Exam (SYMPTOM  OR DIAGNOSIS REQUIRED)    Answer:   Right breast pain approx 9 to 12 o clock, intermittent but seems most days, without palpable mass or lump or skin changes, sister with breast cancer, no prior abnormal, last mammo 11/2015    Order Specific Question:   Preferred imaging location?    Answer:   [facility]  . POCT HgB A1C     Follow up plan: Return in about 3 months (around 09/09/2017) for DM A1c, Weight check.  Nobie Putnam, [facility] 06/12/2017, 1:01 PM

## 2017-06-12 NOTE — Assessment & Plan Note (Signed)
Previously well controlled DM, now mild increased A1c up to 6.6, from prior 6.1 - 6.6. Still at goal < 7-8 for age Likely some elevated sugar with patient trying to maintain appropriate weight No known complications or hypoglycemia. Last urine microalbumin 0 (02/2017)  Plan:  1. Remain on diet control only - still no rec for medication 2. Encourage improved lifestyle - continue DM diet, continue boost, avoid significant calorie restriction, focus on inc protein and healthy choices, small frequent meals, not need regular strenuous exercise but recommend frequent daily activity and walking 3. Check CBG, bring log to next visit for review 4. Not on ACEi/ARB - negative urine microalbumin 5. Continue Statin - inc dose now to nightly instead of TIW (M-W-F) / Defer ASA prior easy bleeding 6. Follow-up 3 months - DM A1c wt check

## 2017-06-12 NOTE — Assessment & Plan Note (Signed)
Stable, controlled hypothyroidism Last TSH normal Refilled Levothyroxine 100mcg daily, in future if still problem with wt gain may consider adjust dose to lower to see if helps

## 2017-06-12 NOTE — Assessment & Plan Note (Signed)
Controlled cholesterol on statin and lifestyle Last lipid panel 11/2016 Calculated ASCVD 10 yr risk score >30%  Plan: 1. INCREASE Rosuvastatin 5mg  from TIW (M-W-F) to nightly since tolerating well - further reduce ASCVD risk in future - Defer ASA - history of easy bleeding on trial years ago - Follow-up yearly lipids

## 2017-06-14 DIAGNOSIS — L570 Actinic keratosis: Secondary | ICD-10-CM | POA: Diagnosis not present

## 2017-06-14 DIAGNOSIS — L578 Other skin changes due to chronic exposure to nonionizing radiation: Secondary | ICD-10-CM | POA: Diagnosis not present

## 2017-06-19 ENCOUNTER — Ambulatory Visit
Admission: RE | Admit: 2017-06-19 | Discharge: 2017-06-19 | Disposition: A | Discharge status: Home / Self Care | Payer: Medicare Other | Source: Ambulatory Visit | Attending: Family Medicine | Admitting: Family Medicine

## 2017-06-19 DIAGNOSIS — N644 Mastodynia: Secondary | ICD-10-CM

## 2017-06-19 DIAGNOSIS — R922 Inconclusive mammogram: Secondary | ICD-10-CM | POA: Diagnosis not present

## 2017-09-11 ENCOUNTER — Encounter: Payer: Self-pay | Admitting: Family Medicine

## 2017-09-11 ENCOUNTER — Other Ambulatory Visit: Payer: Self-pay | Admitting: Family Medicine

## 2017-09-11 ENCOUNTER — Ambulatory Visit (INDEPENDENT_AMBULATORY_CARE_PROVIDER_SITE_OTHER): Payer: Medicare Other | Admitting: Family Medicine

## 2017-09-11 VITALS — BP 130/67 | HR 61 | Temp 97.9°F | Resp 16 | Ht 61.0 in | Wt 94.0 lb

## 2017-09-11 DIAGNOSIS — E119 Type 2 diabetes mellitus without complications: Secondary | ICD-10-CM

## 2017-09-11 DIAGNOSIS — E440 Moderate protein-calorie malnutrition: Secondary | ICD-10-CM

## 2017-09-11 DIAGNOSIS — E7800 Pure hypercholesterolemia, unspecified: Secondary | ICD-10-CM

## 2017-09-11 DIAGNOSIS — E039 Hypothyroidism, unspecified: Secondary | ICD-10-CM

## 2017-09-11 LAB — POCT GLYCOSYLATED HEMOGLOBIN (HGB A1C): HEMOGLOBIN A1C: 6.7 % — AB (ref 4.0–5.6)

## 2017-09-11 NOTE — Assessment & Plan Note (Signed)
Previously well controlled DM, now still increased from 6.6 to 6.7, relatively stable, still below goal 7 to 8 for age Likely some elevated sugar with patient trying to maintain appropriate weight No known complications or hypoglycemia. Last urine microalbumin 0 (02/2017)  Plan:  Reassurance - okay with A1c 7 to 8 - she is hesitant 1. Remain on diet control only - still no rec for medication 2. Encourage improved lifestyle - continue DM diet, continue boost, avoid significant calorie restriction, focus on inc protein (DOUBLE portion [place], chicken and fish) and healthy choices, small frequent meals, not need regular strenuous exercise but recommend frequent daily activity and walking 3. Check CBG, bring log to next visit for review 4. Not on ACEi/ARB - negative urine microalbumin 5. Continue Statin - nightly / defer off ASA 6. Follow-up 3 months - annual and labs A1c

## 2017-09-11 NOTE — Addendum Note (Signed)
Addended by: Olin Hauser on: 09/11/2017 01:11 PM   Modules accepted: Orders

## 2017-09-11 NOTE — Assessment & Plan Note (Signed)
Weight down 2 lbs, BMI >17.7 Concern for further declining weight despite reported improved appetite Continue daily protein meal supplement Boost/Ensure/Glucerna daily - given free sample of Ensure today Avoid limiting calories due to DM, since well controlled Increase protein portions / servings - meats chicken fish [place] Defer Mirtazapine again, decline and may not affect appetite if already eating well Again recommend nutritionist referral - will defer once more for 3 months annual - if still weight loss remains < BMI 18 then will refer to Nutrition for 2nd opinion

## 2017-09-11 NOTE — Patient Instructions (Addendum)
Thank you for coming to the office today.  Please call your Medicare Insurance Plan to speak with benefits and ask what the cost and coverage is for a "Annual Physical" (performed by your doctor) - Due to changes with Medicare and the Medicare advantage plans, we need to confirm this BEFORE we perform a physical and send out the charge. - All Medicare plans should cover an "Annual Medicare Wellness" (performed by a nurse health advisor, not doctor) - this is FREE and can be done once a year  A1c 6.7 - virtually no change, last was 6.6  Try to increase protein portions as we discussed, inc or double - chicken / [place] or fish serving, and can reduce carb or starch serving if you want  Otherwise, it is okay to get sugar A1c up to around 7.5 if needed to gain weight  Follow-up with [facility] for next Diabetic Eye check - make sure that they send [place] a copy of your report anytime after August 2019.  Call Dr Gaylyn Cheers at [facility] to request scheduling next screening colonoscopy - likely last one, this is important given weight loss as well  Please schedule a Follow-up Appointment to: Return in about 3 months (around 12/12/2017) for Yearly Medicare Checkup.  If you have any other questions or concerns, please feel free to call the office or send a message through [place]. You may also schedule an earlier appointment if necessary.  Additionally, you may be receiving a survey about your experience at our office within a few days to 1 week by e-mail or mail. We value your feedback.  Nobie Putnam, DO [place]

## 2017-09-11 NOTE — Progress Notes (Signed)
Subjective:    Patient ID: Barbara Santos, female    DOB: 09-21-1935, 82 y.o.   MRN: [id number]  Barbara Santos is a 82 y.o. female presenting on 09/11/2017 for Diabetes   HPI   Protein Calorie Malnutrition, mild to moderate / BMI >17.7 - Today reports appetite is good still, trying to improve weight limited results from last visit, in 3 months lost 2 lbs, goal wt up to 105 lb, she says max she ever weighed was 125 lbs - Eats mostly home cooked foods, appetite is good, also consuming daily boost, lower sugar option, some Ensure, she does eat proteins such as chicken, [place], fish, trying to limit carbs/starches  CHRONIC DM, Type 2: Reports still same concern that her sugar CBG readings seem to be gradually increased from last time. CBGs:overal CBG readings 130-150, avg, without low hypoglycemia, high 278 (after dessert) (x 1 daily, variable times of day) Meds:None CurrentlyNOT onACEi / ARB Lifestyle: - Diet (Drinks mostly water, eats protein [place] chicken fish, limits carbs and sugar, drinks boost daily, low sugar option 4g - she drinks some glucerna and occasional Ensure)  - Exercise (walkingand household activities) -Last DM Eye Exam 12/05/16, [place] Denies hypoglycemia, polyuria, visual changes, numbness or tingling.   Health Maintenance:  Due for colonoscopy - will review at upcoming visit, previously done >10 year ago by Dr Gaylyn Cheers, report benign, she will call them to request repeat screening.  Depression screen [facility] 2/9 09/11/2017 06/12/2017 10/24/2016  Decreased Interest 0 0 0  Down, Depressed, Hopeless 0 0 0  PHQ - 2 Score 0 0 0  Altered sleeping - 0 -  Tired, decreased energy - 0 -  Change in appetite - 0 -  Feeling bad or failure about yourself  - 0 -  Trouble concentrating - 0 -  Moving slowly or fidgety/restless - 0 -  Suicidal thoughts - 0 -  PHQ-9 Score - 0 -  Difficult doing work/chores - Not difficult at all -    Social  History   Tobacco Use  . Smoking status: Never Smoker  . Smokeless tobacco: Never Used  Substance Use Topics  . Alcohol use: No    Alcohol/week: 0.0 oz  . Drug use: No    Review of Systems Per HPI unless specifically indicated above     Objective:    BP 130/67   Pulse 61   Temp 97.9 F (36.6 C) (Oral)   Resp 16   Ht 5\' 1"  (1.549 m)   Wt 94 lb (42.6 kg)   BMI 17.76 kg/m   Wt Readings from Last 3 Encounters:  09/11/17 94 lb (42.6 kg)  06/12/17 96 lb (43.5 kg)  04/26/17 99 lb 9.6 oz (45.2 kg)    Physical Exam  Constitutional: She is oriented to person, place, and time. She appears well-developed and well-nourished. No distress.  Well-appearing, comfortable, cooperative, thin appearing  HENT:  Head: Normocephalic and atraumatic.  Mouth/Throat: Oropharynx is clear and moist.  Eyes: Conjunctivae are normal. Right eye exhibits no discharge. Left eye exhibits no discharge.  Cardiovascular: Normal rate.  Pulmonary/Chest: Effort normal.  Musculoskeletal: She exhibits no edema.  Some mild muscle atrophy facial temple region and extremities  Neurological: She is alert and oriented to person, place, and time.  Skin: Skin is warm and dry. No rash noted. She is not diaphoretic. No erythema.  Psychiatric: She has a normal mood and affect. Her behavior is normal.  Well groomed, good eye contact, normal  speech and thoughts  Nursing note and vitals reviewed.    Recent Labs    12/11/16 1208 06/12/17 0842 09/11/17 1003  HGBA1C 6.1* 6.6* 6.7*    Results for orders placed or performed in visit on 09/11/17  POCT HgB A1C  Result Value Ref Range   Hemoglobin A1C 6.7 (A) 4.0 - 5.6 %   HbA1c, POC (prediabetic range)  5.7 - 6.4 %   HbA1c, POC (controlled diabetic range)  0.0 - 7.0 %      Assessment & Plan:   Problem List Items Addressed This Visit    Controlled type 2 diabetes mellitus without complication ([place]) - Primary    Previously well controlled DM, now still increased  from 6.6 to 6.7, relatively stable, still below goal 7 to 8 for age Likely some elevated sugar with patient trying to maintain appropriate weight No known complications or hypoglycemia. Last urine microalbumin 0 (02/2017)  Plan:  Reassurance - okay with A1c 7 to 8 - she is hesitant 1. Remain on diet control only - still no rec for medication 2. Encourage improved lifestyle - continue DM diet, continue boost, avoid significant calorie restriction, focus on inc protein (DOUBLE portion [place], chicken and fish) and healthy choices, small frequent meals, not need regular strenuous exercise but recommend frequent daily activity and walking 3. Check CBG, bring log to next visit for review 4. Not on ACEi/ARB - negative urine microalbumin 5. Continue Statin - nightly / defer off ASA 6. Follow-up 3 months - annual and labs A1c      Relevant Orders   POCT HgB A1C (Completed)   Protein calorie malnutrition (H[facility])    Weight down 2 lbs, BMI >17.7 Concern for further declining weight despite reported improved appetite Continue daily protein meal supplement Boost/Ensure/Glucerna daily - given free sample of Ensure today Avoid limiting calories due to DM, since well controlled Increase protein portions / servings - meats chicken fish [place] Defer Mirtazapine again, decline and may not affect appetite if already eating well Again recommend nutritionist referral - will defer once more for 3 months annual - if still weight loss remains < BMI 18 then will refer to Nutrition for 2nd opinion         No orders of the defined types were placed in this encounter.   Follow up plan: Return in about 3 months (around 12/12/2017) for Yearly Medicare Checkup.  Future labs ordered for 11/2017  Nobie Putnam, [facility] 09/11/2017, 1:11 PM

## 2017-09-13 ENCOUNTER — Other Ambulatory Visit: Payer: Self-pay | Admitting: Nurse Practitioner

## 2017-09-13 DIAGNOSIS — E119 Type 2 diabetes mellitus without complications: Secondary | ICD-10-CM

## 2017-09-13 MED ORDER — GLUCOSE BLOOD VI STRP: ORAL_STRIP | 11 refills | Status: DC

## 2017-09-13 NOTE — Addendum Note (Signed)
Addended by: Olin Hauser on: 09/13/2017 05:02 PM   Modules accepted: Orders

## 2017-10-05 ENCOUNTER — Other Ambulatory Visit: Payer: Self-pay | Admitting: Family Medicine

## 2017-10-05 DIAGNOSIS — J019 Acute sinusitis, unspecified: Secondary | ICD-10-CM

## 2017-10-16 DIAGNOSIS — Z1211 Encounter for screening for malignant neoplasm of colon: Secondary | ICD-10-CM | POA: Diagnosis not present

## 2017-11-13 ENCOUNTER — Ambulatory Visit (INDEPENDENT_AMBULATORY_CARE_PROVIDER_SITE_OTHER): Payer: Medicare Other

## 2017-11-13 VITALS — BP 122/80 | HR 64 | Temp 97.7°F | Resp 16 | Ht 61.0 in | Wt 94.4 lb

## 2017-11-13 DIAGNOSIS — Z0000 Encounter for general adult medical examination without abnormal findings: Secondary | ICD-10-CM

## 2017-11-13 NOTE — Patient Instructions (Signed)
Barbara Santos , Thank you for taking time to come for your Medicare Wellness Visit. I appreciate your ongoing commitment to your health goals. Please review the following plan we discussed and let me know if I can assist you in the future.   Screening recommendations/referrals: Colonoscopy: scheduled 12/31/2017 Mammogram: completed 06/19/2017 Bone Density: completed 04/24/2012, no longer required Recommended yearly ophthalmology/optometry visit for glaucoma screening and checkup Recommended yearly dental visit for hygiene and checkup  Vaccinations: Influenza vaccine: due 12/2017 Pneumococcal vaccine: completed series Tdap vaccine: up to date Shingles vaccine: shingrix eligible, check with your insurance company for coverage     Advanced directives: Please bring a copy of your health care power of attorney and living will to the office at your convenience.  Conditions/risks identified: discussed weight gain/preventing weight loss.   Next appointment: Follow up on 11/23/2017 at 10:00am with Dr.Karamalegos. Follow up in one year for your annual wellness exam.    Preventive Care 65 Years and Older, Female Preventive care refers to lifestyle choices and visits with your health care provider that can promote health and wellness. What does preventive care include?  A yearly physical exam. This is also called an annual well check.  Dental exams once or twice a year.  Routine eye exams. Ask your health care provider how often you should have your eyes checked.  Personal lifestyle choices, including:  Daily care of your teeth and gums.  Regular physical activity.  Eating a healthy diet.  Avoiding tobacco and drug use.  Limiting alcohol use.  Practicing safe sex.  Taking low-dose aspirin every day.  Taking vitamin and mineral supplements as recommended by your health care provider. What happens during an annual well check? The services and screenings done by your health care provider  during your annual well check will depend on your age, overall health, lifestyle risk factors, and family history of disease. Counseling  Your health care provider may ask you questions about your:  Alcohol use.  Tobacco use.  Drug use.  Emotional well-being.  Home and relationship well-being.  Sexual activity.  Eating habits.  History of falls.  Memory and ability to understand (cognition).  Work and work Statistician.  Reproductive health. Screening  You may have the following tests or measurements:  Height, weight, and BMI.  Blood pressure.  Lipid and cholesterol levels. These may be checked every 5 years, or more frequently if you are over 74 years old.  Skin check.  Lung cancer screening. You may have this screening every year starting at age 40 if you have a 30-pack-year history of smoking and currently smoke or have quit within the past 15 years.  Fecal occult blood test (FOBT) of the stool. You may have this test every year starting at age 33.  Flexible sigmoidoscopy or colonoscopy. You may have a sigmoidoscopy every 5 years or a colonoscopy every 10 years starting at age 71.  Hepatitis C blood test.  Hepatitis B blood test.  Sexually transmitted disease (STD) testing.  Diabetes screening. This is done by checking your blood sugar (glucose) after you have not eaten for a while (fasting). You may have this done every 1-3 years.  Bone density scan. This is done to screen for osteoporosis. You may have this done starting at age 59.  Mammogram. This may be done every 1-2 years. Talk to your health care provider about how often you should have regular mammograms. Talk with your health care provider about your test results, treatment options, and if  necessary, the need for more tests. Vaccines  Your health care provider may recommend certain vaccines, such as:  Influenza vaccine. This is recommended every year.  Tetanus, diphtheria, and acellular pertussis  (Tdap, Td) vaccine. You may need a Td booster every 10 years.  Zoster vaccine. You may need this after age 80.  Pneumococcal 13-valent conjugate (PCV13) vaccine. One dose is recommended after age 72.  Pneumococcal polysaccharide (PPSV23) vaccine. One dose is recommended after age 25. Talk to your health care provider about which screenings and vaccines you need and how often you need them. This information is not intended to replace advice given to you by your health care provider. Make sure you discuss any questions you have with your health care provider. Document Released: 05/07/2015 Document Revised: 12/29/2015 Document Reviewed: 02/09/2015 Elsevier Interactive Patient Education  2017 [place] Prevention in the Home Falls can cause injuries. They can happen to people of all ages. There are many things you can do to make your home safe and to help prevent falls. What can I do on the outside of my home?  Regularly fix the edges of walkways and driveways and fix any cracks.  Remove anything that might make you trip as you walk through a door, such as a raised step or threshold.  Trim any bushes or trees on the path to your home.  Use bright outdoor lighting.  Clear any walking paths of anything that might make someone trip, such as rocks or tools.  Regularly check to see if handrails are loose or broken. Make sure that both sides of any steps have handrails.  Any raised decks and porches should have guardrails on the edges.  Have any leaves, snow, or ice cleared regularly.  Use sand or salt on walking paths during winter.  Clean up any spills in your garage right away. This includes oil or grease spills. What can I do in the bathroom?  Use night lights.  Install grab bars by the toilet and in the tub and shower. Do not use towel bars as grab bars.  Use non-skid mats or decals in the tub or shower.  If you need to sit down in the shower, use a plastic, non-slip  stool.  Keep the floor dry. Clean up any water that spills on the floor as soon as it happens.  Remove soap buildup in the tub or shower regularly.  Attach bath mats securely with double-sided non-slip rug tape.  Do not have throw rugs and other things on the floor that can make you trip. What can I do in the bedroom?  Use night lights.  Make sure that you have a light by your bed that is easy to reach.  Do not use any sheets or blankets that are too big for your bed. They should not hang down onto the floor.  Have a firm chair that has side arms. You can use this for support while you get dressed.  Do not have throw rugs and other things on the floor that can make you trip. What can I do in the kitchen?  Clean up any spills right away.  Avoid walking on wet floors.  Keep items that you use a lot in easy-to-reach places.  If you need to reach something above you, use a strong step stool that has a grab bar.  Keep electrical cords out of the way.  Do not use floor polish or wax that makes floors slippery. If you must  use wax, use non-skid floor wax.  Do not have throw rugs and other things on the floor that can make you trip. What can I do with my stairs?  Do not leave any items on the stairs.  Make sure that there are handrails on both sides of the stairs and use them. Fix handrails that are broken or loose. Make sure that handrails are as long as the stairways.  Check any carpeting to make sure that it is firmly attached to the stairs. Fix any carpet that is loose or worn.  Avoid having throw rugs at the top or bottom of the stairs. If you do have throw rugs, attach them to the floor with carpet tape.  Make sure that you have a light switch at the top of the stairs and the bottom of the stairs. If you do not have them, ask someone to add them for you. What else can I do to help prevent falls?  Wear shoes that:  Do not have high heels.  Have rubber bottoms.  Are  comfortable and fit you well.  Are closed at the toe. Do not wear sandals.  If you use a stepladder:  Make sure that it is fully opened. Do not climb a closed stepladder.  Make sure that both sides of the stepladder are locked into place.  Ask someone to hold it for you, if possible.  Clearly mark and make sure that you can see:  Any grab bars or handrails.  First and last steps.  Where the edge of each step is.  Use tools that help you move around (mobility aids) if they are needed. These include:  Canes.  Walkers.  Scooters.  Crutches.  Turn on the lights when you go into a dark area. Replace any light bulbs as soon as they burn out.  Set up your furniture so you have a clear path. Avoid moving your furniture around.  If any of your floors are uneven, fix them.  If there are any pets around you, be aware of where they are.  Review your medicines with your doctor. Some medicines can make you feel dizzy. This can increase your chance of falling. Ask your doctor what other things that you can do to help prevent falls. This information is not intended to replace advice given to you by your health care provider. Make sure you discuss any questions you have with your health care provider. Document Released: 02/04/2009 Document Revised: 09/16/2015 Document Reviewed: 05/15/2014 Elsevier Interactive Patient Education  2017 [employer].

## 2017-11-13 NOTE — Progress Notes (Signed)
Subjective:   Barbara Santos is a 82 y.o. female who presents for Medicare Annual (Subsequent) preventive examination.  Review of Systems:   Cardiac Risk Factors include: diabetes mellitus;advanced age (>40men, >57 women);dyslipidemia;hypertension     Objective:     Vitals: BP 122/80 (BP Location: Left Arm, Patient Position: Sitting)   Pulse 64   Temp 97.7 F (36.5 C) (Oral)   Resp 16   Ht 5\' 1"  (1.549 m)   Wt 94 lb 6.4 oz (42.8 kg)   BMI 17.84 kg/m   Body mass index is 17.84 kg/m.  Advanced Directives 11/13/2017 10/24/2016 11/06/2014  Does Patient Have a Medical Advance Directive? Yes Yes No  Type of Advance Directive Living will;Healthcare Power of [place];Living will -  Copy of [place] in Chart? No - copy requested No - copy requested -    Tobacco Social History   Tobacco Use  Smoking Status Never Smoker  Smokeless Tobacco Never Used     Counseling given: Not Answered   Clinical Intake:  Pre-visit preparation completed: Yes  Pain : No/denies pain     Nutritional Status: BMI <19  Underweight Nutritional Risks: None Diabetes: Yes CBG done?: No Did pt. bring in CBG monitor from home?: No  How often do you need to have someone help you when you read instructions, pamphlets, or other written materials from your doctor or pharmacy?: 1 - Never What is the last grade level you completed in school?: 12th grade  Interpreter Needed?: No  Information entered by :: Tiffany Hill,LPN   Past Medical History:  Diagnosis Date  . Arthritis   . Diabetes ([place])   . Glaucoma   . Hypothyroidism   . Insomnia   . Left hip pain   . Protein calorie malnutrition ([place])    Past Surgical History:  Procedure Laterality Date  . TOTAL ABDOMINAL HYSTERECTOMY  2009   Family History  Problem Relation Age of Onset  . Diabetes Mellitus II Mother   . Stroke Father   . Stroke Maternal Grandmother   . Heart disease  Paternal Grandfather   . Breast cancer Sister 59   Social History   Socioeconomic History  . Marital status: Widowed    Spouse name: Not on file  . Number of children: Not on file  . Years of education: Not on file  . Highest education level: High school graduate  Occupational History  . Not on file  Social Needs  . Financial resource strain: Not hard at all  . Food insecurity:    Worry: Never true    Inability: Never true  . Transportation needs:    Medical: No    Non-medical: No  Tobacco Use  . Smoking status: Never Smoker  . Smokeless tobacco: Never Used  Substance and Sexual Activity  . Alcohol use: No    Alcohol/week: 0.0 oz  . Drug use: No  . Sexual activity: Not on file  Lifestyle  . Physical activity:    Days per week: 0 days    Minutes per session: 0 min  . Stress: Not at all  Relationships  . Social connections:    Talks on phone: More than three times a week    Gets together: More than three times a week    Attends religious service: More than 4 times per year    Active member of club or organization: No    Attends meetings of clubs or organizations: Never  Relationship status: Widowed  Other Topics Concern  . Not on file  Social History Narrative  . Not on file    Outpatient Encounter Medications as of 11/13/2017  Medication Sig  . Cholecalciferol (VITAMIN D) 2000 UNITS tablet Take 2,000 Units by mouth daily.  . diphenhydrAMINE (BENADRYL) 25 MG tablet Take 25 mg by mouth every 6 (six) hours as needed.  . dorzolamide-timolol (COSOPT) 22.3-6.8 MG/ML ophthalmic solution Place 1 drop into both eyes 2 (two) times daily.   Marland Kitchen glucose blood (ONETOUCH VERIO) test strip Check blood sugar up to once daily  . ipratropium (ATROVENT) 0.06 % nasal spray USE 2 SPRAYS IN EACH NOSTRIL FOUR TIMES DAILY FOR UP TO 5 TO 7 DAYS THEN STOP  . lactose free nutrition (BOOST) LIQD Take 237 mLs by mouth 3 (three) times daily between meals.  Marland Kitchen levothyroxine (SYNTHROID,  LEVOTHROID) 50 MCG tablet TAKE 1 TABLET BY MOUTH EVERY DAY BEFORE BREAKFAST  . Multiple Vitamin (MULTIVITAMIN) tablet Take 1 tablet by mouth daily.  Glory Rosebush DELICA LANCETS FINE MISC 1 each by Other route 3 (three) times daily.  . rosuvastatin (CRESTOR) 5 MG tablet Take 1 tablet (5 mg total) by mouth at bedtime.   No facility-administered encounter medications on file as of 11/13/2017.     Activities of Daily Living In your present state of health, do you have any difficulty performing the following activities: 11/13/2017  Hearing? N  Vision? Y  Comment glaucoma   Difficulty concentrating or making decisions? N  Walking or climbing stairs? N  Dressing or bathing? N  Doing errands, shopping? N  Preparing Food and eating ? N  Using the Toilet? N  In the past six months, have you accidently leaked urine? N  Do you have problems with loss of bowel control? N  Managing your Medications? N  Managing your Finances? N  Housekeeping or managing your Housekeeping? N  Some recent data might be hidden    Patient Care Team: Olin Hauser, DO as PCP - General (Family Medicine)    Assessment:   This is a routine wellness examination for Barbara Santos.  Exercise Activities and Dietary recommendations Current Exercise Habits: Home exercise routine, Time (Minutes): 10, Frequency (Times/Week): 6, Weekly Exercise (Minutes/Week): 60, Intensity: Mild, Exercise limited by: None identified  Goals    . Gain weight     Little weight gain by next year    . Gain weight       Fall Risk Fall Risk  11/13/2017 09/11/2017 10/24/2016 06/27/2016 02/24/2016  Falls in the past year? Yes No Yes Yes No  Number falls in past yr: 1 - 1 1 -  Injury with Fall? No - - No -  Risk for fall due to : History of fall(s) - - - -  Follow up Falls evaluation completed;Falls prevention discussed - Falls prevention discussed - -   Is the patient's home free of loose throw rugs in walkways, pet beds, electrical cords,  etc?   yes      Grab bars in the bathroom? yes      Handrails on the stairs?   no stairs       Adequate lighting?   yes  Timed Get Up and Go performed: Completed in 9 seconds with no use of assistive devices, steady gait. No intervention needed at this time.   Depression Screen PHQ 2/9 Scores 11/13/2017 09/11/2017 06/12/2017 10/24/2016  PHQ - 2 Score 0 0 0 0  PHQ- 9 Score - - 0 -  Cognitive Function MMSE - Mini Mental State Exam 11/06/2014  Orientation to time 5  Orientation to Place 5  Registration 3  Attention/ Calculation 5  Recall 3  Language- name 2 objects 2  Language- repeat 1  Language- follow 3 step command 3  Language- read & follow direction 1  Write a sentence 1  Copy design 1  Total score 30     6CIT Screen 11/13/2017 10/24/2016  What Year? 0 points 0 points  What month? 0 points 0 points  What time? 0 points 0 points  Count back from 20 0 points 0 points  Months in reverse 0 points 0 points  Repeat phrase 0 points 0 points  Total Score 0 0    Immunization History  Administered Date(s) Administered  . Influenza, High Dose Seasonal PF 01/05/2015, 02/24/2016, 01/16/2017  . Influenza-Unspecified 01/28/2014  . Pneumococcal Conjugate-13 12/03/2013  . Pneumococcal Polysaccharide-23 10/24/2016  . Tdap 12/03/2013  . Zoster 04/24/2010    Qualifies for Shingles Vaccine? Yes, discussed shingrix vaccine   Screening Tests Health Maintenance  Topic Date Due  . INFLUENZA VACCINE  11/22/2017  . OPHTHALMOLOGY EXAM  12/05/2017  . FOOT EXAM  02/27/2018  . URINE MICROALBUMIN  02/27/2018  . HEMOGLOBIN A1C  03/14/2018  . TETANUS/TDAP  12/04/2023  . DEXA SCAN  Completed  . PNA vac Low Risk Adult  Completed    Cancer Screenings: Lung: Low Dose CT Chest recommended if Age 10-80 years, 30 pack-year currently smoking OR have quit w/in 15years. Patient does not qualify.  Breast:  Up to date on Mammogram? Yes  06/19/2017 Up to date of Bone Density/Dexa? Yes  04/24/2012 Colorectal: no longer required, scheduled for 12/31/2017  Additional Screenings:  Hepatitis C Screening: not indicated      Plan:    I have personally reviewed and addressed the Medicare Annual Wellness questionnaire and have noted the following in the patient's chart:  A. Medical and social history B. Use of alcohol, tobacco or illicit drugs  C. Current medications and supplements D. Functional ability and status E.  Nutritional status F.  Physical activity G. Advance directives H. List of other physicians I.  Hospitalizations, surgeries, and ER visits in previous 12 months J.  [place] such as hearing and vision if needed, cognitive and depression L. Referrals and appointments   In addition, I have reviewed and discussed with patient certain preventive protocols, quality metrics, and best practice recommendations. A written personalized care plan for preventive services as well as general preventive health recommendations were provided to patient.   Signed,  Tyler Aas, LPN Nurse Health Advisor   Nurse Notes:none

## 2017-12-18 ENCOUNTER — Encounter: Payer: Self-pay | Admitting: Family Medicine

## 2017-12-18 ENCOUNTER — Ambulatory Visit (INDEPENDENT_AMBULATORY_CARE_PROVIDER_SITE_OTHER): Payer: Medicare Other | Admitting: Family Medicine

## 2017-12-18 VITALS — BP 134/58 | HR 58 | Temp 98.2°F | Resp 16 | Ht 61.0 in | Wt 96.0 lb

## 2017-12-18 DIAGNOSIS — E440 Moderate protein-calorie malnutrition: Secondary | ICD-10-CM | POA: Diagnosis not present

## 2017-12-18 DIAGNOSIS — E1169 Type 2 diabetes mellitus with other specified complication: Secondary | ICD-10-CM | POA: Diagnosis not present

## 2017-12-18 LAB — POCT GLYCOSYLATED HEMOGLOBIN (HGB A1C): Hemoglobin A1C: 6.8 % — AB (ref 4.0–5.6)

## 2017-12-18 MED ORDER — METFORMIN HCL ER 500 MG PO TB24: 500.0000 mg | ORAL_TABLET | Freq: Every day | ORAL | 1 refills | Status: DC

## 2017-12-18 NOTE — Progress Notes (Signed)
Subjective:    Patient ID: Barbara Santos, female    DOB: 1935/06/13, 82 y.o.   MRN: [id number]  Barbara Santos is a 82 y.o. female presenting on 12/18/2017 for Diabetes   HPI   CHRONIC DM, Type 2: Reports overall doing well. She has gained small amount of weight. She is tracking sugar readings. Today A1c up slightly to 6.8, she is worried about carb and sugar intake but wants to gain weight. CBGs: Avg 120-140s, low 94, high 190, checks CBG 1-2 times daily. Has detailed CBG log with her. 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  Protein Calorie Malnutrition Mild weight gain in past 1 month 1.5 lbs, improved diet See lifestyle above   Health Maintenance: Return for Flu vaccine  Depression screen [facility] 2/9 12/18/2017 11/13/2017 09/11/2017  Decreased Interest 0 0 0  Down, Depressed, Hopeless 0 0 0  PHQ - 2 Score 0 0 0  Altered sleeping - - -  Tired, decreased energy - - -  Change in appetite - - -  Feeling bad or failure about yourself  - - -  Trouble concentrating - - -  Moving slowly or fidgety/restless - - -  Suicidal thoughts - - -  PHQ-9 Score - - -  Difficult doing work/chores - - -    Social History   Tobacco Use  . Smoking status: Never Smoker  . Smokeless tobacco: Never Used  Substance Use Topics  . Alcohol use: No    Alcohol/week: 0.0 standard drinks  . Drug use: No    Review of Systems Per HPI unless specifically indicated above     Objective:    BP (!) 134/58 (BP Location: Left Arm, Cuff Size: Normal)   Pulse (!)  58   Temp 98.2 F (36.8 C) (Oral)   Resp 16   Ht 5\' 1"  (1.549 m)   Wt 96 lb (43.5 kg)   BMI 18.14 kg/m   Wt Readings from Last 3 Encounters:  12/18/17 96 lb (43.5 kg)  11/13/17 94 lb 6.4 oz (42.8 kg)  09/11/17 94 lb (42.6 kg)    Physical Exam  Constitutional: She is oriented to person, place, and time. She appears well-developed and well-nourished. No distress.  Well-appearing, comfortable, cooperative  HENT:  Head: Normocephalic and atraumatic.  Mouth/Throat: Oropharynx is clear and moist.  Eyes: Conjunctivae are normal. Right eye exhibits no discharge. Left eye exhibits no discharge.  Neck: Normal range of motion. Neck supple. No thyromegaly present.  Cardiovascular: Normal rate, regular rhythm, normal heart sounds and intact distal pulses.  No murmur heard. Pulmonary/Chest: Effort normal and breath sounds normal. No respiratory distress. She has no wheezes. She has no rales.  Musculoskeletal: Normal range of motion. She exhibits no edema.  Generalized mild muscle atrophy, stable  Lymphadenopathy:    She has no cervical adenopathy.  Neurological: She is alert and oriented to person, place, and time.  Skin: Skin is warm and dry. No rash noted. She is not diaphoretic. No erythema.  Psychiatric: She has a normal mood and affect. Her behavior is normal.  Well groomed, good eye contact, normal speech and thoughts  Nursing note and vitals reviewed.    Diabetic Foot Exam - Simple   Simple Foot Form Diabetic Foot exam was performed with the following findings:  Yes 12/18/2017 11:09 AM  Visual Inspection See comments:  Yes Sensation Testing Intact to touch and monofilament testing bilaterally:  Yes Pulse Check Posterior Tibialis and Dorsalis pulse intact bilaterally:  Yes Comments Bilateral great toe onychomycosis uncomplicated. No other deformity. No callus formation. Intact sensation to monofilament testing bilateral.    Recent Labs    06/12/17 0842 09/11/17 1003  12/18/17 1054  HGBA1C 6.6* 6.7* 6.8*     Results for orders placed or performed in visit on 12/18/17  POCT HgB A1C  Result Value Ref Range   Hemoglobin A1C 6.8 (A) 4.0 - 5.6 %      Assessment & Plan:   Problem List Items Addressed This Visit    Protein calorie malnutrition ([place])    Improved weight gain in past 1 month Increased calorie count, does not have poor appetite Added metformin to allow inc calorie      Type 2 diabetes mellitus with other specified complication (HCC) - Primary    Stable well controlled DM A1c 6.8 still below goal 7 to 8 for age No known complications or hypoglycemia. Last urine microalbumin 0 (02/2017)  Plan:  Reassurance again controlled A1c Start Metformin XR 500mg  daily - discussed would allow her to intake more calories and control sugar Encourage improved lifestyle - continue DM diet but high protein may inc calorie intake and continue boost Check CBG, bring log to next visit for review Not on ACEi/ARB - negative urine microalbumin - previously Continue Statin - nightly / defer off ASA Follow-up 3 months - annual and labs A1c      Relevant Medications   metFORMIN (GLUCOPHAGE-XR) 500 MG 24 hr tablet   Other Relevant Orders   POCT HgB A1C (Completed)      Meds ordered this encounter  Medications  . metFORMIN (GLUCOPHAGE-XR) 500 MG 24 hr tablet    Sig: Take 1 tablet (500 mg total) by mouth daily with breakfast.    Dispense:  90 tablet    Refill:  1    Follow up plan: Return in about 3 months (around 03/20/2018) for Annual Physical.  Future labs ordered for 03/14/18  Nobie Putnam, [facility] 12/18/2017, 6:23 PM

## 2017-12-18 NOTE — Patient Instructions (Addendum)
Thank you for coming to the office today.  A1c 6.8, this is no problem, as discussed your sugar is controlled on average. Goal is < 7.0 to 8  Make sure [facility] sends a copy of your Diabetic Eye Report  START Metformin XR 24 hour pill - 500mg  daily with breakfast - this may help control sugar and allow you to take in more nutrition as discussed  Let us know if you need meds sent to your mail order pharmacy.  DUE for FASTING BLOOD WORK (no food or drink after midnight before the lab appointment, only water or coffee without cream/sugar on the morning of)  SCHEDULE "Lab Only" visit in the morning at the clinic for lab draw in 3 MONTHS   - Make sure Lab Only appointment is at about 1 week before your next appointment, so that results will be available  For Lab Results, once available within 2-3 days of blood draw, you can can log in to MyChart online to view your results and a brief explanation. Also, we can discuss results at next follow-up visit.   Please schedule a Follow-up Appointment to: Return in about 3 months (around 03/20/2018) for Annual Physical.  If you have any other questions or concerns, please feel free to call the office or send a message through [place]. You may also schedule an earlier appointment if necessary.  Additionally, you may be receiving a survey about your experience at our office within a few days to 1 week by e-mail or mail. We value your feedback.  Nobie Putnam, DO [place]

## 2017-12-19 ENCOUNTER — Other Ambulatory Visit: Payer: Self-pay | Admitting: Family Medicine

## 2017-12-19 DIAGNOSIS — E7800 Pure hypercholesterolemia, unspecified: Secondary | ICD-10-CM

## 2017-12-19 DIAGNOSIS — E1169 Type 2 diabetes mellitus with other specified complication: Secondary | ICD-10-CM

## 2017-12-19 DIAGNOSIS — E440 Moderate protein-calorie malnutrition: Secondary | ICD-10-CM

## 2017-12-19 DIAGNOSIS — Z0000 Encounter for general adult medical examination without abnormal findings: Secondary | ICD-10-CM

## 2017-12-19 DIAGNOSIS — E039 Hypothyroidism, unspecified: Secondary | ICD-10-CM

## 2017-12-19 NOTE — Assessment & Plan Note (Signed)
Stable well controlled DM A1c 6.8 still below goal 7 to 8 for age No known complications or hypoglycemia. Last urine microalbumin 0 (02/2017)  Plan:  Reassurance again controlled A1c Start Metformin XR 500mg  daily - discussed would allow her to intake more calories and control sugar Encourage improved lifestyle - continue DM diet but high protein may inc calorie intake and continue boost Check CBG, bring log to next visit for review Not on ACEi/ARB - negative urine microalbumin - previously Continue Statin - nightly / defer off ASA Follow-up 3 months - annual and labs A1c

## 2017-12-19 NOTE — Assessment & Plan Note (Signed)
Improved weight gain in past 1 month Increased calorie count, does not have poor appetite Added metformin to allow inc calorie

## 2017-12-28 ENCOUNTER — Encounter: Payer: Self-pay | Admitting: *Deleted

## 2017-12-31 ENCOUNTER — Encounter: Payer: Self-pay | Admitting: *Deleted

## 2017-12-31 ENCOUNTER — Ambulatory Visit: Payer: Medicare Other | Admitting: Anesthesiology

## 2017-12-31 ENCOUNTER — Encounter
Admission: RE | Disposition: A | Discharge status: Home / Self Care | Payer: Self-pay | Source: Ambulatory Visit | Attending: Unknown Physician Specialty

## 2017-12-31 ENCOUNTER — Ambulatory Visit
Admission: RE | Admit: 2017-12-31 | Discharge: 2017-12-31 | Disposition: A | Discharge status: Home / Self Care | Payer: Medicare Other | Source: Ambulatory Visit | Attending: Unknown Physician Specialty | Admitting: Unknown Physician Specialty

## 2017-12-31 DIAGNOSIS — K640 First degree hemorrhoids: Secondary | ICD-10-CM | POA: Insufficient documentation

## 2017-12-31 DIAGNOSIS — Z79899 Other long term (current) drug therapy: Secondary | ICD-10-CM | POA: Diagnosis not present

## 2017-12-31 DIAGNOSIS — E785 Hyperlipidemia, unspecified: Secondary | ICD-10-CM | POA: Diagnosis not present

## 2017-12-31 DIAGNOSIS — Z1211 Encounter for screening for malignant neoplasm of colon: Secondary | ICD-10-CM | POA: Diagnosis not present

## 2017-12-31 DIAGNOSIS — K648 Other hemorrhoids: Secondary | ICD-10-CM | POA: Diagnosis not present

## 2017-12-31 DIAGNOSIS — E039 Hypothyroidism, unspecified: Secondary | ICD-10-CM | POA: Diagnosis not present

## 2017-12-31 DIAGNOSIS — H409 Unspecified glaucoma: Secondary | ICD-10-CM | POA: Diagnosis not present

## 2017-12-31 DIAGNOSIS — G4700 Insomnia, unspecified: Secondary | ICD-10-CM | POA: Insufficient documentation

## 2017-12-31 DIAGNOSIS — Z7984 Long term (current) use of oral hypoglycemic drugs: Secondary | ICD-10-CM | POA: Insufficient documentation

## 2017-12-31 DIAGNOSIS — E119 Type 2 diabetes mellitus without complications: Secondary | ICD-10-CM | POA: Insufficient documentation

## 2017-12-31 HISTORY — DX: Hyperlipidemia, unspecified: E78.5

## 2017-12-31 HISTORY — PX: COLONOSCOPY WITH PROPOFOL: SHX5780

## 2017-12-31 LAB — GLUCOSE, CAPILLARY: Glucose-Capillary: 96 mg/dL (ref 70–99)

## 2017-12-31 SURGERY — COLONOSCOPY WITH PROPOFOL
Anesthesia: General

## 2017-12-31 MED ORDER — ONDANSETRON HCL 4 MG/2ML IJ SOLN: 4.0000 mg | Freq: Once | INTRAMUSCULAR | Status: DC | PRN

## 2017-12-31 MED ORDER — PROPOFOL 10 MG/ML IV BOLUS
INTRAVENOUS | Status: DC | PRN
  Administered 2017-12-31 (×2): 10 mg via INTRAVENOUS

## 2017-12-31 MED ORDER — FENTANYL CITRATE (PF) 100 MCG/2ML IJ SOLN
INTRAMUSCULAR | Status: DC | PRN
  Administered 2017-12-31 (×2): 25 ug via INTRAVENOUS

## 2017-12-31 MED ORDER — LIDOCAINE HCL (PF) 2 % IJ SOLN
INTRAMUSCULAR | Status: DC | PRN
  Administered 2017-12-31: 40 mg

## 2017-12-31 MED ORDER — LIDOCAINE HCL (PF) 2 % IJ SOLN
INTRAMUSCULAR | Status: AC
  Filled 2017-12-31: qty 10

## 2017-12-31 MED ORDER — FENTANYL CITRATE (PF) 100 MCG/2ML IJ SOLN: 25.0000 ug | INTRAMUSCULAR | Status: DC | PRN

## 2017-12-31 MED ORDER — MIDAZOLAM HCL 5 MG/5ML IJ SOLN
INTRAMUSCULAR | Status: DC | PRN
  Administered 2017-12-31: 0.5 mg via INTRAVENOUS

## 2017-12-31 MED ORDER — SODIUM CHLORIDE 0.9 % IV SOLN: INTRAVENOUS | Status: DC

## 2017-12-31 MED ORDER — PROPOFOL 500 MG/50ML IV EMUL
INTRAVENOUS | Status: DC | PRN
  Administered 2017-12-31: 50 ug/kg/min via INTRAVENOUS

## 2017-12-31 MED ORDER — MIDAZOLAM HCL 2 MG/2ML IJ SOLN
INTRAMUSCULAR | Status: AC
  Filled 2017-12-31: qty 2

## 2017-12-31 MED ORDER — FENTANYL CITRATE (PF) 100 MCG/2ML IJ SOLN
INTRAMUSCULAR | Status: AC
  Filled 2017-12-31: qty 2

## 2017-12-31 MED ORDER — SODIUM CHLORIDE 0.9 % IV SOLN
INTRAVENOUS | Status: DC
  Administered 2017-12-31: 1000 mL via INTRAVENOUS

## 2017-12-31 NOTE — Transfer of Care (Signed)
Immediate Anesthesia Transfer of Care Note  Patient: Barbara Santos  Procedure(s) Performed: COLONOSCOPY WITH PROPOFOL (N/A )  Patient Location: PACU  Anesthesia Type:General  Level of Consciousness: sedated  Airway & Oxygen Therapy: Patient Spontanous Breathing and Patient connected to nasal cannula oxygen  Post-op Assessment: Report given to RN and Post -op Vital signs reviewed and stable  Post vital signs: Reviewed and stable  Last Vitals:  Vitals Value Taken Time  BP 150/70 12/31/2017 10:16 AM  Temp 36.1 C 12/31/2017 10:16 AM  Pulse 64 12/31/2017 10:16 AM  Resp 18 12/31/2017 10:16 AM  SpO2 100 % 12/31/2017 10:16 AM  Vitals shown include unvalidated device data.  Last Pain:  Vitals:   12/31/17 1016  TempSrc: Tympanic  PainSc: 0-No pain         Complications: No apparent anesthesia complications

## 2017-12-31 NOTE — Anesthesia Post-op Follow-up Note (Signed)
Anesthesia QCDR form completed.        

## 2017-12-31 NOTE — H&P (Signed)
Primary Care Physician:  Olin Hauser, DO Primary Gastroenterologist:  Dr. Vira Agar  Pre-Procedure History & Physical: HPI:  Barbara Santos is a 82 y.o. female is here for an colonoscopy.  Screening colonoscopy.  Last colon was 10 years ago and no polyps found.   Past Medical History:  Diagnosis Date  . Arthritis   . Diabetes mellitus without complication ([place])   . Glaucoma   . Hyperlipidemia   . Hypothyroidism   . Insomnia   . Left hip pain   . Protein calorie malnutrition ([place])     Past Surgical History:  Procedure Laterality Date  . HERNIA REPAIR    . TOTAL ABDOMINAL HYSTERECTOMY  2009    Prior to Admission medications   Medication Sig Start Date End Date Taking? Authorizing Provider  Cholecalciferol (VITAMIN D) 2000 UNITS tablet Take 2,000 Units by mouth daily.   Yes [provider]  dorzolamide-timolol (COSOPT) 22.3-6.8 MG/ML ophthalmic solution Place 1 drop into both eyes 2 (two) times daily.  08/09/15  Yes [provider]  glucose blood (ONETOUCH VERIO) test strip Check blood sugar up to once daily 09/13/17  Yes Karamalegos, Devonne Doughty, DO  levothyroxine (SYNTHROID, LEVOTHROID) 50 MCG tablet TAKE 1 TABLET BY MOUTH EVERY DAY BEFORE BREAKFAST 06/12/17  Yes Karamalegos, Devonne Doughty, DO  metFORMIN (GLUCOPHAGE-XR) 500 MG 24 hr tablet Take 1 tablet (500 mg total) by mouth daily with breakfast. 12/18/17  Yes Karamalegos, Devonne Doughty, DO  Multiple Vitamin (MULTIVITAMIN) tablet Take 1 tablet by mouth daily.   Yes [provider]  [facility] DELICA LANCETS FINE MISC 1 each by Other route 3 (three) times daily. 09/17/15  Yes [provider]  rosuvastatin (CRESTOR) 5 MG tablet Take 1 tablet (5 mg total) by mouth at bedtime. 06/12/17  Yes Karamalegos, Devonne Doughty, DO  diphenhydrAMINE (BENADRYL) 25 MG tablet Take 25 mg by mouth every 6 (six) hours as needed.    [provider]  lactose free nutrition (BOOST) LIQD Take 237 mLs  by mouth 3 (three) times daily between meals.    [provider]    Allergies as of 10/19/2017 - Review Complete 09/11/2017  Allergen Reaction Noted  . Penicillins  11/06/2014    Family History  Problem Relation Age of Onset  . Diabetes Mellitus II Mother   . Stroke Father   . Stroke Maternal Grandmother   . Heart disease Paternal Grandfather   . Breast cancer Sister 62    Social History   Socioeconomic History  . Marital status: Widowed    Spouse name: Not on file  . Number of children: Not on file  . Years of education: Not on file  . Highest education level: High school graduate  Occupational History  . Not on file  Social Needs  . Financial resource strain: Not hard at all  . Food insecurity:    Worry: Never true    Inability: Never true  . Transportation needs:    Medical: No    Non-medical: No  Tobacco Use  . Smoking status: Never Smoker  . Smokeless tobacco: Never Used  Substance and Sexual Activity  . Alcohol use: No    Alcohol/week: 0.0 standard drinks  . Drug use: No  . Sexual activity: Not on file  Lifestyle  . Physical activity:    Days per week: 0 days    Minutes per session: 0 min  . Stress: Not at all  Relationships  . Social connections:    Talks on  phone: More than three times a week    Gets together: More than three times a week    Attends religious service: More than 4 times per year    Active member of club or organization: No    Attends meetings of clubs or organizations: Never    Relationship status: Widowed  . Intimate partner violence:    Fear of current or ex partner: No    Emotionally abused: No    Physically abused: No    Forced sexual activity: No  Other Topics Concern  . Not on file  Social History Narrative  . Not on file    Review of Systems: See HPI, otherwise negative ROS  Physical Exam: BP (!) 162/61   Pulse (!) 53   Temp (!) 96.4 F (35.8 C) (Tympanic)   Resp 18   Ht 5\' 1"  (1.549 m)   Wt 43.5 kg    SpO2 100%   BMI 18.14 kg/m  General:   Alert,  pleasant and cooperative in NAD Head:  Normocephalic and atraumatic. Neck:  Supple; no masses or thyromegaly. Lungs:  Clear throughout to auscultation.    Heart:  Regular rate and rhythm. Abdomen:  Soft, nontender and nondistended. Normal bowel sounds, without guarding, and without rebound.   Neurologic:  Alert and  oriented x4;  grossly normal neurologically.  Impression/Plan: Barbara Santos is here for an colonoscopy to be performed for screening for colon  Cancer.  Risks, benefits, limitations, and alternatives regarding  colonoscopy have been reviewed with the patient.  Questions have been answered.  All parties agreeable.   Gaylyn Cheers, MD  12/31/2017, 9:35 AM

## 2017-12-31 NOTE — Op Note (Signed)
[facility] Gastroenterology Patient Name: Barbara Santos Procedure Date: 12/31/2017 9:33 AM MRN: [id number] Account #: [id number] Date of Birth: [birth year] Admit Type: Outpatient Age: 82 Room: [facility] ENDO ROOM 1 Gender: Female Note Status: Finalized Procedure:            Colonoscopy Indications:          Screening for colorectal malignant neoplasm Providers:            Manya Silvas, MD Referring MD:         Olin Hauser (Referring MD) Medicines:            Propofol per Anesthesia Complications:        No immediate complications. Procedure:            Pre-Anesthesia Assessment:                       - After reviewing the risks and benefits, the patient                        was deemed in satisfactory condition to undergo the                        procedure.                       After obtaining informed consent, the colonoscope was                        passed under direct vision. Throughout the procedure,                        the patient's blood pressure, pulse, and oxygen                        saturations were monitored continuously. The                        Colonoscope was introduced through the anus and                        advanced to the the cecum, identified by appendiceal                        orifice and ileocecal valve. The colonoscopy was                        performed without difficulty. The patient tolerated the                        procedure well. The quality of the bowel preparation                        was adequate to identify polyps 6 mm and larger in size. Findings:      Internal hemorrhoids were found during endoscopy. The hemorrhoids were       small and Grade I (internal hemorrhoids that do not prolapse).      The exam was otherwise without abnormality. Impression:           - Internal hemorrhoids.                       -  The examination was otherwise normal.                       - No specimens  collected. Recommendation:       - The findings and recommendations were discussed with                        the patient's family. Given her age and absense of                        findings today No repeat recommended. Manya Silvas, MD 12/31/2017 10:15:01 AM This report has been signed electronically. Number of Addenda: 0 Note Initiated On: 12/31/2017 9:33 AM Scope Withdrawal Time: 0 hours 14 minutes 46 seconds  Total Procedure Duration: 0 hours 28 minutes 51 seconds       [facility]

## 2017-12-31 NOTE — Anesthesia Preprocedure Evaluation (Signed)
Anesthesia Evaluation  Patient identified by MRN, date of birth, ID band Patient awake    Reviewed: Allergy & Precautions, H&P , NPO status , Patient's Chart, lab work & pertinent test results, reviewed documented beta blocker date and time   Airway Mallampati: II   Neck ROM: full    Dental  (+) Poor Dentition   Pulmonary neg pulmonary ROS,    Pulmonary exam normal        Cardiovascular Exercise Tolerance: Poor negative cardio ROS Normal cardiovascular exam Rhythm:regular Rate:Normal     Neuro/Psych negative neurological ROS  negative psych ROS   GI/Hepatic negative GI ROS, Neg liver ROS,   Endo/Other  negative endocrine ROSdiabetes, Well Controlled, Type 2, Oral Hypoglycemic AgentsHypothyroidism   Renal/GU negative Renal ROS  negative genitourinary   Musculoskeletal   Abdominal   Peds  Hematology negative hematology ROS (+)   Anesthesia Other Findings Past Medical History: No date: Arthritis No date: Diabetes mellitus without complication (HCC) No date: Glaucoma No date: Hyperlipidemia No date: Hypothyroidism No date: Insomnia No date: Left hip pain No date: Protein calorie malnutrition (HCC) Past Surgical History: No date: HERNIA REPAIR 2009: TOTAL ABDOMINAL HYSTERECTOMY BMI    Body Mass Index:  18.14 kg/m     Reproductive/Obstetrics negative OB ROS                             Anesthesia Physical Anesthesia Plan  ASA: III  Anesthesia Plan: General   Post-op Pain Management:    Induction:   PONV Risk Score and Plan:   Airway Management Planned:   Additional Equipment:   Intra-op Plan:   Post-operative Plan:   Informed Consent: I have reviewed the patients History and Physical, chart, labs and discussed the procedure including the risks, benefits and alternatives for the proposed anesthesia with the patient or authorized representative who has indicated his/her  understanding and acceptance.   Dental Advisory Given  Plan Discussed with: CRNA  Anesthesia Plan Comments:         Anesthesia Quick Evaluation

## 2018-01-01 ENCOUNTER — Encounter: Payer: Self-pay | Admitting: Unknown Physician Specialty

## 2018-01-08 NOTE — Anesthesia Postprocedure Evaluation (Signed)
Anesthesia Post Note  Patient: Barbara Santos  Procedure(s) Performed: COLONOSCOPY WITH PROPOFOL (N/A )  Patient location during evaluation: PACU Anesthesia Type: General Level of consciousness: awake and alert Pain management: pain level controlled Vital Signs Assessment: post-procedure vital signs reviewed and stable Respiratory status: spontaneous breathing, nonlabored ventilation, respiratory function stable and patient connected to nasal cannula oxygen Cardiovascular status: blood pressure returned to baseline and stable Postop Assessment: no apparent nausea or vomiting Anesthetic complications: no     Last Vitals:  Vitals:   12/31/17 1036 12/31/17 1046  BP: 107/77 (!) 145/77  Pulse: 72 (!) 54  Resp: 14 10  Temp:    SpO2: 100% 100%    Last Pain:  Vitals:   12/31/17 1046  TempSrc:   PainSc: 0-No pain                 Molli Barrows

## 2018-01-16 NOTE — Progress Notes (Signed)
Received fax from [facility] that patient received flu shot from pharmacy.

## 2018-01-31 DIAGNOSIS — E119 Type 2 diabetes mellitus without complications: Secondary | ICD-10-CM | POA: Diagnosis not present

## 2018-01-31 DIAGNOSIS — H40053 Ocular hypertension, bilateral: Secondary | ICD-10-CM | POA: Diagnosis not present

## 2018-01-31 LAB — HM DIABETES EYE EXAM

## 2018-02-06 ENCOUNTER — Encounter: Payer: Self-pay | Admitting: Family Medicine

## 2018-02-21 DIAGNOSIS — Z1283 Encounter for screening for malignant neoplasm of skin: Secondary | ICD-10-CM | POA: Diagnosis not present

## 2018-02-21 DIAGNOSIS — D485 Neoplasm of uncertain behavior of skin: Secondary | ICD-10-CM | POA: Diagnosis not present

## 2018-02-21 DIAGNOSIS — Z85828 Personal history of other malignant neoplasm of skin: Secondary | ICD-10-CM | POA: Diagnosis not present

## 2018-02-21 DIAGNOSIS — L570 Actinic keratosis: Secondary | ICD-10-CM | POA: Diagnosis not present

## 2018-02-21 DIAGNOSIS — L821 Other seborrheic keratosis: Secondary | ICD-10-CM | POA: Diagnosis not present

## 2018-03-14 ENCOUNTER — Other Ambulatory Visit: Payer: Medicare Other

## 2018-03-14 DIAGNOSIS — E1169 Type 2 diabetes mellitus with other specified complication: Secondary | ICD-10-CM | POA: Diagnosis not present

## 2018-03-14 DIAGNOSIS — E7800 Pure hypercholesterolemia, unspecified: Secondary | ICD-10-CM | POA: Diagnosis not present

## 2018-03-14 DIAGNOSIS — E039 Hypothyroidism, unspecified: Secondary | ICD-10-CM

## 2018-03-14 DIAGNOSIS — E440 Moderate protein-calorie malnutrition: Secondary | ICD-10-CM

## 2018-03-14 DIAGNOSIS — Z0000 Encounter for general adult medical examination without abnormal findings: Secondary | ICD-10-CM

## 2018-03-15 LAB — COMPLETE METABOLIC PANEL WITH GFR
AG RATIO: 1.7 (calc) (ref 1.0–2.5)
ALBUMIN MSPROF: 4.4 g/dL (ref 3.6–5.1)
ALT: 18 U/L (ref 6–29)
AST: 22 U/L (ref 10–35)
Alkaline phosphatase (APISO): 90 U/L (ref 33–130)
BUN: 15 mg/dL (ref 7–25)
CALCIUM: 10.3 mg/dL (ref 8.6–10.4)
CO2: 31 mmol/L (ref 20–32)
CREATININE: 0.69 mg/dL (ref 0.60–0.88)
Chloride: 101 mmol/L (ref 98–110)
GFR, EST AFRICAN AMERICAN: 95 mL/min/{1.73_m2} (ref >=60)
GFR, EST NON AFRICAN AMERICAN: 82 mL/min/{1.73_m2} (ref >=60)
GLOBULIN: 2.6 g/dL (ref 1.9–3.7)
Glucose, Bld: 112 mg/dL — ABNORMAL HIGH (ref 65–99)
POTASSIUM: 4.3 mmol/L (ref 3.5–5.3)
SODIUM: 136 mmol/L (ref 135–146)
Total Bilirubin: 0.5 mg/dL (ref 0.2–1.2)
Total Protein: 7 g/dL (ref 6.1–8.1)

## 2018-03-15 LAB — CBC WITH DIFFERENTIAL/PLATELET
BASOS ABS: 40 {cells}/uL (ref 0–200)
Basophils Relative: 0.8 %
EOS ABS: 100 {cells}/uL (ref 15–500)
Eosinophils Relative: 2 %
HEMATOCRIT: 38.8 % (ref 35.0–45.0)
HEMOGLOBIN: 13.2 g/dL (ref 11.7–15.5)
LYMPHS ABS: 2185 {cells}/uL (ref 850–3900)
MCH: 31 pg (ref 27.0–33.0)
MCHC: 34 g/dL (ref 32.0–36.0)
MCV: 91.1 fL (ref 80.0–100.0)
MPV: 10.7 fL (ref 7.5–12.5)
Monocytes Relative: 11.6 %
NEUTROS ABS: 2095 {cells}/uL (ref 1500–7800)
Neutrophils Relative %: 41.9 %
Platelets: 240 10*3/uL (ref 140–400)
RBC: 4.26 10*6/uL (ref 3.80–5.10)
RDW: 11.7 % (ref 11.0–15.0)
Total Lymphocyte: 43.7 %
WBC mixed population: 580 cells/uL (ref 200–950)
WBC: 5 10*3/uL (ref 3.8–10.8)

## 2018-03-15 LAB — LIPID PANEL
CHOLESTEROL: 124 mg/dL (ref <200)
HDL: 59 mg/dL (ref >50)
LDL Cholesterol (Calc): 52 mg/dL (calc)
Non-HDL Cholesterol (Calc): 65 mg/dL (calc) (ref <130)
Total CHOL/HDL Ratio: 2.1 (calc) (ref <5.0)
Triglycerides: 54 mg/dL (ref <150)

## 2018-03-15 LAB — T4, FREE: Free T4: 1.4 ng/dL (ref 0.8–1.8)

## 2018-03-15 LAB — HEMOGLOBIN A1C
Hgb A1c MFr Bld: 6.6 % of total Hgb — ABNORMAL HIGH (ref <5.7)
MEAN PLASMA GLUCOSE: 143 (calc)
eAG (mmol/L): 7.9 (calc)

## 2018-03-15 LAB — PREALBUMIN: PREALBUMIN: 26 mg/dL (ref 17–34)

## 2018-03-15 LAB — TSH: TSH: 2.2 m[IU]/L (ref 0.40–4.50)

## 2018-03-19 ENCOUNTER — Encounter: Payer: Medicare Other | Admitting: Family Medicine

## 2018-03-20 ENCOUNTER — Encounter: Payer: Self-pay | Admitting: Family Medicine

## 2018-03-20 ENCOUNTER — Ambulatory Visit (INDEPENDENT_AMBULATORY_CARE_PROVIDER_SITE_OTHER): Payer: Medicare Other | Admitting: Family Medicine

## 2018-03-20 VITALS — BP 148/64 | HR 63 | Temp 98.0°F | Resp 16 | Ht 61.0 in | Wt 97.8 lb

## 2018-03-20 DIAGNOSIS — Z0000 Encounter for general adult medical examination without abnormal findings: Secondary | ICD-10-CM | POA: Diagnosis not present

## 2018-03-20 DIAGNOSIS — E440 Moderate protein-calorie malnutrition: Secondary | ICD-10-CM

## 2018-03-20 DIAGNOSIS — E7800 Pure hypercholesterolemia, unspecified: Secondary | ICD-10-CM | POA: Diagnosis not present

## 2018-03-20 DIAGNOSIS — E1169 Type 2 diabetes mellitus with other specified complication: Secondary | ICD-10-CM | POA: Diagnosis not present

## 2018-03-20 DIAGNOSIS — E039 Hypothyroidism, unspecified: Secondary | ICD-10-CM

## 2018-03-20 LAB — POCT UA - MICROALBUMIN: MICROALBUMIN (UR) POC: 0 mg/L

## 2018-03-20 MED ORDER — METFORMIN HCL ER 500 MG PO TB24: 500.0000 mg | ORAL_TABLET | Freq: Every day | ORAL | 3 refills | Status: DC

## 2018-03-20 MED ORDER — ROSUVASTATIN CALCIUM 5 MG PO TABS: 5.0000 mg | ORAL_TABLET | Freq: Every day | ORAL | 3 refills | Status: DC

## 2018-03-20 MED ORDER — LEVOTHYROXINE SODIUM 50 MCG PO TABS: ORAL_TABLET | ORAL | 3 refills | Status: DC

## 2018-03-20 NOTE — Patient Instructions (Addendum)
Thank you for coming to the office today.  Overall you are doing very well. Keep up the great work.  Weight gain 2 lbs  Thyroid is normal.  Sugar is improved A1c 6.6  Recent Labs    09/11/17 1003 12/18/17 1054 03/14/18 0819  HGBA1C 6.7* 6.8* 6.6*    Refilled all meds to [facility] mail order  I am not sure why blood sugar after lunch and breakfast is elevated, but your AM fasting and PM sugar is improved  Keep track of BP at home once a week approximately, goal is < 140/90 - if persistently elevated >150 consistently then notify office sooner after few weeks we can discuss medicine sooner  Urine test today for protein to check screening.   Please schedule a Follow-up Appointment to: Return in about 6 months (around 09/18/2018) for 6 month follow-up DM A1c, BP check ?HTN, Wt.  If you have any other questions or concerns, please feel free to call the office or send a message through [place]. You may also schedule an earlier appointment if necessary.  Additionally, you may be receiving a survey about your experience at our office within a few days to 1 week by e-mail or mail. We value your feedback.  Nobie Putnam, DO [place]

## 2018-03-20 NOTE — Progress Notes (Signed)
Subjective:    Patient ID: Barbara Santos, female    DOB: [birth year], 82 y.o.   MRN: [id number]  Barbara Santos is a 82 y.o. female presenting on 03/20/2018 for Annual Exam   HPI   Here for Annual Physical and Lab Review  CHRONIC DM, Type 2: Last visit 12/18/17, had mild elevated A1c 6.8, she was started on Metformin XR 500mg  daily as initial therapy at that time. - Today reports doing well on medicine. CBGs: Avg110-130ss, low >90, high < 200, checks CBG 1-2 times daily. Has detailed CBG log with her. 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 Last lipid panel 02/2018 improved LDL - Currently taking rosuvastatin 5mg  nightly, tolerating well without side effects or myalgias  Elevated BP without HTN Reports unsure why elevated BP today, prior record without significant elevated BP Current Meds - none    Protein Calorie Malnutrition Mild weight gain in past 3 months - +2 lbs, improved calorie intake and protein Limiting bread and sweets still Normal pre-albumin lab See lifestyle above  Health Maintenance: UTD Flu vaccine, PNA Vaccine  Depression screen [facility] 2/9 03/20/2018 12/18/2017 11/13/2017  Decreased Interest 0 0 0  Down, Depressed, Hopeless 0 0 0  PHQ - 2 Score 0 0 0  Altered sleeping - - -  Tired, decreased energy - - -  Change in appetite - - -  Feeling bad or failure about yourself  - - -  Trouble concentrating - - -  Moving slowly or fidgety/restless - - -  Suicidal thoughts - - -  PHQ-9 Score - - -  Difficult doing work/chores - - -    Past Medical History:  Diagnosis Date  . Arthritis   . Glaucoma   . Hyperlipidemia   . Hypothyroidism   .  Insomnia   . Left hip pain   . Protein calorie malnutrition ([place])    Past Surgical History:  Procedure Laterality Date  . COLONOSCOPY WITH PROPOFOL N/A 12/31/2017   Procedure: COLONOSCOPY WITH PROPOFOL;  Surgeon: Manya Silvas, MD;  Location: [facility] ENDOSCOPY;  Service: Endoscopy;  Laterality: N/A;  . HERNIA REPAIR    . TOTAL ABDOMINAL HYSTERECTOMY  2009   Social History   Socioeconomic History  . Marital status: Widowed    Spouse name: Not on file  . Number of children: Not on file  . Years of education: Not on file  . Highest education level: High school graduate  Occupational History  . Not on file  Social Needs  . Financial resource strain: Not hard at all  . Food insecurity:    Worry: Never true    Inability: Never true  . Transportation needs:    Medical: No    Non-medical: No  Tobacco Use  . Smoking status: Never Smoker  . Smokeless tobacco: Never Used  Substance and Sexual Activity  . Alcohol use: No    Alcohol/week: 0.0 standard drinks  . Drug use: No  . Sexual activity: Not on file  Lifestyle  . Physical activity:    Days per week: 0 days    Minutes per session: 0 min  . Stress: Not at all  Relationships  . Social connections:    Talks on phone: More than  three times a week    Gets together: More than three times a week    Attends religious service: More than 4 times per year    Active member of club or organization: No    Attends meetings of clubs or organizations: Never    Relationship status: Widowed  . Intimate partner violence:    Fear of current or ex partner: No    Emotionally abused: No    Physically abused: No    Forced sexual activity: No  Other Topics Concern  . Not on file  Social History Narrative  . Not on file   Family History  Problem Relation Age of Onset  . Diabetes Mellitus II Mother   . Stroke Father   . Stroke Maternal Grandmother   . Heart disease Paternal Grandfather   . Breast cancer Sister 80   Current Outpatient  Medications on File Prior to Visit  Medication Sig  . Cholecalciferol (VITAMIN D) 2000 UNITS tablet Take 2,000 Units by mouth daily.  . diphenhydrAMINE (BENADRYL) 25 MG tablet Take 25 mg by mouth every 6 (six) hours as needed.  . dorzolamide-timolol (COSOPT) 22.3-6.8 MG/ML ophthalmic solution Place 1 drop into both eyes 2 (two) times daily.   Marland Kitchen glucose blood (ONETOUCH VERIO) test strip Check blood sugar up to once daily  . lactose free nutrition (BOOST) LIQD Take 237 mLs by mouth 3 (three) times daily between meals.  . Multiple Vitamin (MULTIVITAMIN) tablet Take 1 tablet by mouth daily.  Glory Rosebush DELICA LANCETS FINE MISC 1 each by Other route 3 (three) times daily.   No current facility-administered medications on file prior to visit.     Review of Systems  Constitutional: Negative for activity change, appetite change, chills, diaphoresis, fatigue and fever.  HENT: Negative for congestion and hearing loss.   Eyes: Negative for visual disturbance.  Respiratory: Negative for apnea, cough, choking, chest tightness, shortness of breath and wheezing.   Cardiovascular: Negative for chest pain, palpitations and leg swelling.  Gastrointestinal: Negative for abdominal pain, anal bleeding, blood in stool, constipation, diarrhea, nausea and vomiting.  Endocrine: Negative for cold intolerance.  Genitourinary: Negative for difficulty urinating, dysuria, frequency and hematuria.  Musculoskeletal: Negative for arthralgias, back pain and neck pain.  Skin: Negative for rash.  Allergic/Immunologic: Negative for environmental allergies.  Neurological: Negative for dizziness, weakness, light-headedness, numbness and headaches.  Hematological: Negative for adenopathy.  Psychiatric/Behavioral: Negative for behavioral problems, dysphoric mood and sleep disturbance. The patient is not nervous/anxious.    Per HPI unless specifically indicated above      Objective:    BP (!) 148/64 (BP Location: Left Arm,  Cuff Size: Normal)   Pulse 63   Temp 98 F (36.7 C) (Oral)   Resp 16   Ht 5\' 1"  (1.549 m)   Wt 97 lb 12.8 oz (44.4 kg)   BMI 18.48 kg/m   Wt Readings from Last 3 Encounters:  03/20/18 97 lb 12.8 oz (44.4 kg)  12/31/17 96 lb (43.5 kg)  12/18/17 96 lb (43.5 kg)    Physical Exam  Constitutional: She is oriented to person, place, and time. She appears well-developed and well-nourished. No distress.  Well-appearing, comfortable, cooperative  HENT:  Head: Normocephalic and atraumatic.  Mouth/Throat: Oropharynx is clear and moist.  Eyes: Pupils are equal, round, and reactive to light. Conjunctivae and EOM are normal. Right eye exhibits no discharge. Left eye exhibits no discharge.  Neck: Normal range of motion. Neck supple. No thyromegaly present.  Cardiovascular: Normal  rate, regular rhythm, normal heart sounds and intact distal pulses.  No murmur heard. Pulmonary/Chest: Effort normal and breath sounds normal. No respiratory distress. She has no wheezes. She has no rales.  Abdominal: Soft. Bowel sounds are normal. She exhibits no distension and no mass. There is no tenderness.  Musculoskeletal: Normal range of motion. She exhibits no edema or tenderness.  Upper / Lower Extremities: - Normal muscle tone, strength bilateral upper extremities 5/5, lower extremities 5/5  Lymphadenopathy:    She has no cervical adenopathy.  Neurological: She is alert and oriented to person, place, and time.  Distal sensation intact to light touch all extremities  Skin: Skin is warm and dry. No rash noted. She is not diaphoretic. No erythema.  Psychiatric: She has a normal mood and affect. Her behavior is normal.  Well groomed, good eye contact, normal speech and thoughts  Nursing note and vitals reviewed.    Chemistry      Component Value Date/Time   NA 136 03/14/2018 0819   NA 142 01/12/2015 0835   K 4.3 03/14/2018 0819   CL 101 03/14/2018 0819   CO2 31 03/14/2018 0819   BUN 15 03/14/2018 0819    BUN 18 01/12/2015 0835   CREATININE 0.69 03/14/2018 0819   GLU 103 09/09/2014      Component Value Date/Time   CALCIUM 10.3 03/14/2018 0819   ALKPHOS 95 12/11/2016 1208   AST 22 03/14/2018 0819   ALT 18 03/14/2018 0819   BILITOT 0.5 03/14/2018 0819   BILITOT 0.5 01/12/2015 0835     Lipid Panel     Component Value Date/Time   CHOL 124 03/14/2018 0819   CHOL 176 01/12/2015 0835   TRIG 54 03/14/2018 0819   HDL 59 03/14/2018 0819   HDL 54 01/12/2015 0835   CHOLHDL 2.1 03/14/2018 0819   VLDL 15 12/11/2016 1208   LDLCALC 52 03/14/2018 0819   CBC:    Component Value Date/Time   WBC 5.0 03/14/2018 0819   HGB 13.2 03/14/2018 0819   HGB 13.6 01/12/2015 0835   HCT 38.8 03/14/2018 0819   HCT 39.8 01/12/2015 0835   PLT 240 03/14/2018 0819   PLT 238 01/12/2015 0835   MCV 91.1 03/14/2018 0819   MCV 92 01/12/2015 0835   NEUTROABS 2,095 03/14/2018 0819   NEUTROABS 2.3 01/12/2015 0835   LYMPHSABS 2,185 03/14/2018 0819   LYMPHSABS 2.2 01/12/2015 0835   MONOABS 368 12/11/2016 1208   EOSABS 100 03/14/2018 0819   EOSABS 0.1 01/12/2015 0835   BASOSABS 40 03/14/2018 0819   BASOSABS 0.0 01/12/2015 0835    Recent Labs    09/11/17 1003 12/18/17 1054 03/14/18 0819  HGBA1C 6.7* 6.8* 6.6*    Results for orders placed or performed in visit on 03/20/18  POCT UA - Microalbumin  Result Value Ref Range   Microalbumin Ur, POC 0 mg/L      Assessment & Plan:   Problem List Items Addressed This Visit    Elevated LDL cholesterol level    Controlled cholesterol on statin and lifestyle - improved Last lipid panel 02/2018 Calculated ASCVD 10 yr risk score >30%  Plan: 1. Continue Rosuvastatin 5mg  nightly - Defer ASA - history of easy bleeding on trial years ago - Follow-up yearly lipids      Relevant Medications   rosuvastatin (CRESTOR) 5 MG tablet   Hypothyroidism    Stable, controlled hypothyroidism Last TSH normal Continue Levothyroxine 49mcg daily      Relevant Medications  levothyroxine (SYNTHROID, LEVOTHROID) 50 MCG tablet   Protein calorie malnutrition (HCC)    Still improved wt gain, BMI >18 Continue higher calorie intake / higher protein      Type 2 diabetes mellitus with other specified complication (HCC)    Improved, controlled A1c to 6.6 on medicine Complicated by hypothyroidism and hyperlipidemia Last urine microalbumin 0 (02/2018  Plan:  Reassurance again controlled A1c Continue Metformin XR 500mg  daily Encourage improved lifestyle - continue DM diet but high protein may inc calorie intake and continue boost Check CBG, bring log to next visit for review Not on ACEi/ARB - negative urine microalbumin - previously Continue Statin - nightly / defer off ASA Follow-up 6 months A1c      Relevant Medications   rosuvastatin (CRESTOR) 5 MG tablet   metFORMIN (GLUCOPHAGE-XR) 500 MG 24 hr tablet   Other Relevant Orders   POCT UA - Microalbumin (Completed)    Other Visit Diagnoses    Annual physical exam    -  Primary      Updated Health Maintenance information Reviewed recent lab results with patient Encouraged improvement to lifestyle with improve protein in diet, maintain weight   Meds ordered this encounter  Medications  . rosuvastatin (CRESTOR) 5 MG tablet    Sig: Take 1 tablet (5 mg total) by mouth at bedtime.    Dispense:  90 tablet    Refill:  3  . metFORMIN (GLUCOPHAGE-XR) 500 MG 24 hr tablet    Sig: Take 1 tablet (500 mg total) by mouth daily with breakfast.    Dispense:  90 tablet    Refill:  3  . levothyroxine (SYNTHROID, LEVOTHROID) 50 MCG tablet    Sig: TAKE 1 TABLET BY MOUTH EVERY DAY BEFORE BREAKFAST    Dispense:  90 tablet    Refill:  3    Follow up plan: Return in about 6 months (around 09/18/2018) for 6 month follow-up DM A1c, BP check ?HTN, Wt.  Nobie Putnam, [facility] 03/20/2018, 11:00 AM

## 2018-03-21 NOTE — Assessment & Plan Note (Signed)
Controlled cholesterol on statin and lifestyle - improved Last lipid panel 02/2018 Calculated ASCVD 10 yr risk score >30%  Plan: 1. Continue Rosuvastatin 5mg  nightly - Defer ASA - history of easy bleeding on trial years ago - Follow-up yearly lipids

## 2018-03-21 NOTE — Assessment & Plan Note (Signed)
Still improved wt gain, BMI >18 Continue higher calorie intake / higher protein

## 2018-03-21 NOTE — Assessment & Plan Note (Signed)
Stable, controlled hypothyroidism Last TSH normal Continue Levothyroxine 42mcg daily

## 2018-03-21 NOTE — Assessment & Plan Note (Signed)
Improved, controlled A1c to 6.6 on medicine Complicated by hypothyroidism and hyperlipidemia Last urine microalbumin 0 (02/2018  Plan:  Reassurance again controlled A1c Continue Metformin XR 500mg  daily Encourage improved lifestyle - continue DM diet but high protein may inc calorie intake and continue boost Check CBG, bring log to next visit for review Not on ACEi/ARB - negative urine microalbumin - previously Continue Statin - nightly / defer off ASA Follow-up 6 months A1c

## 2018-05-29 ENCOUNTER — Other Ambulatory Visit: Payer: Self-pay | Admitting: Family Medicine

## 2018-05-29 DIAGNOSIS — E7800 Pure hypercholesterolemia, unspecified: Secondary | ICD-10-CM

## 2018-06-15 ENCOUNTER — Other Ambulatory Visit: Payer: Self-pay | Admitting: Family Medicine

## 2018-06-15 DIAGNOSIS — E1169 Type 2 diabetes mellitus with other specified complication: Secondary | ICD-10-CM

## 2018-06-28 ENCOUNTER — Other Ambulatory Visit: Payer: Self-pay | Admitting: Family Medicine

## 2018-06-28 DIAGNOSIS — Z1231 Encounter for screening mammogram for malignant neoplasm of breast: Secondary | ICD-10-CM

## 2018-07-09 ENCOUNTER — Other Ambulatory Visit: Payer: Self-pay

## 2018-07-09 ENCOUNTER — Ambulatory Visit
Admission: RE | Admit: 2018-07-09 | Discharge: 2018-07-09 | Disposition: A | Discharge status: Home / Self Care | Payer: Medicare Other | Source: Ambulatory Visit | Attending: Family Medicine | Admitting: Family Medicine

## 2018-07-09 DIAGNOSIS — Z1231 Encounter for screening mammogram for malignant neoplasm of breast: Secondary | ICD-10-CM | POA: Insufficient documentation

## 2018-09-04 DIAGNOSIS — H40009 Preglaucoma, unspecified, unspecified eye: Secondary | ICD-10-CM | POA: Diagnosis not present

## 2018-09-19 ENCOUNTER — Encounter: Payer: Self-pay | Admitting: Family Medicine

## 2018-09-19 ENCOUNTER — Other Ambulatory Visit: Payer: Self-pay

## 2018-09-19 ENCOUNTER — Other Ambulatory Visit: Payer: Self-pay | Admitting: Family Medicine

## 2018-09-19 ENCOUNTER — Ambulatory Visit (INDEPENDENT_AMBULATORY_CARE_PROVIDER_SITE_OTHER): Payer: Medicare Other | Admitting: Family Medicine

## 2018-09-19 DIAGNOSIS — Z0000 Encounter for general adult medical examination without abnormal findings: Secondary | ICD-10-CM

## 2018-09-19 DIAGNOSIS — R030 Elevated blood-pressure reading, without diagnosis of hypertension: Secondary | ICD-10-CM | POA: Diagnosis not present

## 2018-09-19 DIAGNOSIS — E1169 Type 2 diabetes mellitus with other specified complication: Secondary | ICD-10-CM

## 2018-09-19 DIAGNOSIS — E039 Hypothyroidism, unspecified: Secondary | ICD-10-CM

## 2018-09-19 DIAGNOSIS — K5900 Constipation, unspecified: Secondary | ICD-10-CM | POA: Diagnosis not present

## 2018-09-19 DIAGNOSIS — I10 Essential (primary) hypertension: Secondary | ICD-10-CM | POA: Insufficient documentation

## 2018-09-19 DIAGNOSIS — E7800 Pure hypercholesterolemia, unspecified: Secondary | ICD-10-CM

## 2018-09-19 NOTE — Assessment & Plan Note (Addendum)
Previously controlled A1c 6.6, due for lab- will return tomorrow Complicated by hypothyroidism and hyperlipidemia Last urine microalbumin 0 (02/2018  Plan:  Continue Metformin XR 500mg  daily Encourage improved lifestyle - continue DM diet but high protein may inc calorie intake and continue boost Check CBG, bring log to next visit for review Not on ACEi/ARB - negative urine microalbumin - previously Continue Statin - nightly / defer off ASA Follow-up 6 months annual

## 2018-09-19 NOTE — Progress Notes (Signed)
Virtual Visit via Telephone The purpose of this virtual visit is to provide medical care while limiting exposure to the novel coronavirus (COVID19) for both patient and office staff.  Consent was obtained for phone visit:  Yes.   Answered questions that patient had about telehealth interaction:  Yes.   I discussed the limitations, risks, security and privacy concerns of performing an evaluation and management service by telephone. I also discussed with the patient that there may be a patient responsible charge related to this service. The patient expressed understanding and agreed to proceed.  Patient Location: Home Provider Location: [facility])  ---------------------------------------------------------------------- Chief Complaint  Patient presents with  . Diabetes    S: Reviewed CMA documentation. I have called patient and gathered additional HPI as follows:  CHRONIC DM, Type 2: Last visit 02/2018, had mild elevated A1c 6.6, Metformin XR 500mg  daily  - Today reports doing well on medicine. Still has some occasional high readings due to stress CBGs: Avg110-130s, low >90, high 287 (2 hr after breakfast, due to stress reported), checks CBG 1-2 times daily. Has detailed CBG log with her. 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 She tries to eat balanced diet with fruits and veg, Drinks some water, see above.  No caffeine. Her diet is balanced, eats banana and bran flakes breakfast. - Taking OTC Miralax 17g 1 cap daily  Denies any fevers, chills, sweats, body ache, cough, shortness of breath, sinus pain or pressure, headache, abdominal pain, diarrhea  Past Medical History:  Diagnosis Date  . Arthritis   . Glaucoma   . Hx of basal cell carcinoma    R cheek, R dorsal lat. distal forearm  . Hx of dysplastic nevus    multiple sites  . Hx of squamous cell carcinoma of skin 11/01/2009   L medial mid calf  . Hyperlipidemia   . Hypothyroidism   . Insomnia   . Left hip pain   . Protein calorie malnutrition ([place])    Social History   Tobacco Use  . Smoking status: Never Smoker  . Smokeless tobacco: Never Used  Substance Use Topics  . Alcohol use: No    Alcohol/week: 0.0 standard drinks  . Drug use: No    Current Outpatient Medications:  .  Cholecalciferol (VITAMIN D) 2000 UNITS tablet, Take 2,000 Units by mouth daily., Disp: , Rfl:  .  diphenhydrAMINE (BENADRYL) 25 MG tablet, Take 25 mg by mouth every 6 (six) hours as needed., Disp: , Rfl:  .  dorzolamide-timolol (COSOPT) 22.3-6.8 MG/ML ophthalmic solution, Place 1 drop into both eyes 2 (two) times daily. , Disp: , Rfl: 0 .  glucose blood (ONETOUCH VERIO) test strip, Check blood sugar up to once daily, Disp: 50 each, Rfl: 11 .  lactose free nutrition (BOOST) LIQD, Take 237 mLs by mouth 3 (three) times daily between meals., Disp: , Rfl:  .  levothyroxine (SYNTHROID, LEVOTHROID) 50 MCG tablet, TAKE 1 TABLET BY MOUTH EVERY DAY BEFORE BREAKFAST, Disp: 90  tablet, Rfl: 3 .  metFORMIN (GLUCOPHAGE-XR) 500 MG 24 hr tablet, TAKE 1 TABLET(500 MG) BY MOUTH DAILY WITH BREAKFAST, Disp: 90 tablet, Rfl: 3 .  Multiple Vitamin (MULTIVITAMIN) tablet, Take 1 tablet by mouth daily., Disp: , Rfl:  .  ONETOUCH DELICA LANCETS FINE MISC, 1 each by Other route 3 (three) times daily., Disp: , Rfl: 0 .  rosuvastatin (CRESTOR) 5 MG tablet, TAKE 1 TABLET BY MOUTH AT  BEDTIME, Disp: 90 tablet, Rfl: 3  Depression screen [facility] 2/9 09/19/2018 03/20/2018 12/18/2017  Decreased Interest 0 0 0  Down, Depressed, Hopeless 0 0 0  PHQ - 2 Score 0 0 0  Altered sleeping - - -  Tired, decreased energy - - -  Change in appetite - - -  Feeling bad or failure about yourself  - - -  Trouble concentrating - - -  Moving slowly or fidgety/restless - - -  Suicidal thoughts - - -  PHQ-9 Score - - -  Difficult doing work/chores - - -    No flowsheet data found.  -------------------------------------------------------------------------- O: No physical exam performed due to remote telephone encounter.  Lab results reviewed.  Recent Labs    12/18/17 1054 03/14/18 0819  HGBA1C 6.8* 6.6*     No results found for this or any previous visit (from the past 2160 hour(s)).  -------------------------------------------------------------------------- A&P:  Problem List Items Addressed This Visit    Constipation    Clinically with functional constipation, episodic  Recommend inc hydration, try miralax 1-2 times a day regularly, can take fiber supplement metamucil if need, improve nutrition for BMs as well, if reduced intake at times      Elevated BP without diagnosis of hypertension    Home BP readings controlled Not on medication currently      Type 2 diabetes mellitus with other specified complication ([place]) - Primary    Previously controlled A1c 6.6, due for lab- will return tomorrow Complicated by hypothyroidism and hyperlipidemia Last urine microalbumin 0 (02/2018  Plan:  Continue Metformin XR 500mg  daily Encourage improved lifestyle - continue DM diet but high protein may inc calorie intake and continue boost Check CBG, bring log to next visit for review Not on ACEi/ARB - negative urine microalbumin - previously Continue Statin - nightly / defer off ASA Follow-up 6 months annual         No orders of the defined types were placed in this  encounter.   Follow-up: - Return in 6 months for Annual Physical - Future labs ordered for 03/10/19  After review of A1c  Patient verbalizes understanding with the above medical recommendations including the limitation of remote medical advice.  Specific follow-up and call-back criteria were given for patient to follow-up or seek medical care more urgently if needed.   - Time spent in direct consultation with patient on phone: 14 minutes   Nobie Putnam, [facility] 09/19/2018, 9:46 AM

## 2018-09-19 NOTE — Assessment & Plan Note (Signed)
Clinically with functional constipation, episodic  Recommend inc hydration, try miralax 1-2 times a day regularly, can take fiber supplement metamucil if need, improve nutrition for BMs as well, if reduced intake at times

## 2018-09-19 NOTE — Assessment & Plan Note (Signed)
Home BP readings controlled Not on medication currently

## 2018-09-20 ENCOUNTER — Other Ambulatory Visit: Payer: Medicare Other

## 2018-09-20 DIAGNOSIS — E1169 Type 2 diabetes mellitus with other specified complication: Secondary | ICD-10-CM

## 2018-09-21 LAB — HEMOGLOBIN A1C
Hgb A1c MFr Bld: 6.5 % of total Hgb — ABNORMAL HIGH (ref <5.7)
Mean Plasma Glucose: 140 (calc)
eAG (mmol/L): 7.7 (calc)

## 2018-09-23 ENCOUNTER — Other Ambulatory Visit: Payer: Self-pay | Admitting: Family Medicine

## 2018-09-23 DIAGNOSIS — E7800 Pure hypercholesterolemia, unspecified: Secondary | ICD-10-CM

## 2018-09-23 DIAGNOSIS — Z0000 Encounter for general adult medical examination without abnormal findings: Secondary | ICD-10-CM

## 2018-09-23 DIAGNOSIS — E440 Moderate protein-calorie malnutrition: Secondary | ICD-10-CM

## 2018-09-23 DIAGNOSIS — R030 Elevated blood-pressure reading, without diagnosis of hypertension: Secondary | ICD-10-CM

## 2018-09-23 DIAGNOSIS — E119 Type 2 diabetes mellitus without complications: Secondary | ICD-10-CM

## 2018-09-23 DIAGNOSIS — E1169 Type 2 diabetes mellitus with other specified complication: Secondary | ICD-10-CM

## 2018-09-23 DIAGNOSIS — E039 Hypothyroidism, unspecified: Secondary | ICD-10-CM

## 2018-09-24 ENCOUNTER — Telehealth: Payer: Self-pay

## 2018-09-24 NOTE — Telephone Encounter (Signed)
-----   Message from Olin Hauser, DO sent at 09/23/2018  5:16 PM EDT ----- Please contact patient to review the following (No MyChart Access):  1. Hemoglobin A1c (Diabetes) - 6.5, very well controlled. Similar to last result 6 months ago at 6.6. There have been some recalls on Metformin XR - she should check with her pharmacy if her med was affected, she may HOLD Metformin XR for now, given how well her sugar is controlled. She can call us if she request a new rx on the regular Metformin 500mg  - one pill twice a day if she prefers instead.  She can follow-up in November 2020 as scheduled.  Nobie Putnam, DO [facility] 09/23/2018, 5:16 PM

## 2018-09-24 NOTE — Telephone Encounter (Signed)
The pt was notified of her lab results. She will contact the pharmacy to f/u on the metformin recall. If her metformin is recalled the pt will call us back with her decision.

## 2018-09-24 NOTE — Telephone Encounter (Signed)
Attempted to contact the pt. No answer, LMOM.

## 2018-11-19 ENCOUNTER — Ambulatory Visit: Payer: Medicare Other

## 2018-11-19 ENCOUNTER — Ambulatory Visit (INDEPENDENT_AMBULATORY_CARE_PROVIDER_SITE_OTHER): Payer: Medicare Other

## 2018-11-19 VITALS — BP 126/62

## 2018-11-19 DIAGNOSIS — Z0000 Encounter for general adult medical examination without abnormal findings: Secondary | ICD-10-CM

## 2018-11-19 NOTE — Progress Notes (Signed)
Subjective:   Barbara Santos is a 83 y.o. female who presents for Medicare Annual (Subsequent) preventive examination.  Review of Systems:  Cardiac Risk Factors include: hypertension;advanced age (>53men, >2 women);dyslipidemia;diabetes mellitus     Objective:     Vitals: BP 126/62 Comment: pt reported  There is no height or weight on file to calculate BMI.  Advanced Directives 11/19/2018 12/31/2017 11/13/2017 10/24/2016 11/06/2014  Does Patient Have a Medical Advance Directive? Yes Yes Yes Yes No  Type of Advance Directive Living will;Healthcare Power of Attorney Living will Living will;Healthcare Power of [place];Living will -  Copy of [place] in Chart? No - copy requested - No - copy requested No - copy requested -    Tobacco Social History   Tobacco Use  Smoking Status Never Smoker  Smokeless Tobacco Never Used     Counseling given: Not Answered   Clinical Intake:  Pre-visit preparation completed: Yes  Pain : No/denies pain     Nutritional Risks: None Diabetes: Yes CBG done?: No Did pt. bring in CBG monitor from home?: No  How often do you need to have someone help you when you read instructions, pamphlets, or other written materials from your doctor or pharmacy?: 1 - Never What is the last grade level you completed in school?: high school  Nutrition Risk Assessment:  Has the patient had any N/V/D within the last 2 months?  No  Does the patient have any non-healing wounds?  No  Has the patient had any unintentional weight loss or weight gain?  No   Diabetes:  Is the patient diabetic?  Yes  If diabetic, was a CBG obtained today? Yes, 144 2 hours after eating - patient reported  Did the patient bring in their glucometer from home?  n/a How often do you monitor your CBG's? 1-2 times a day   Financial Strains and Diabetes Management:  Are you having any financial strains with the device, your supplies  or your medication? No .  Does the patient want to be seen by Chronic Care Management for management of their diabetes?  No  Would the patient like to be referred to a Nutritionist or for Diabetic Management?  No   Diabetic Exams:  Diabetic Eye Exam: Completed 01/31/2018.   Diabetic Foot Exam: Completed 12/18/2017.   Interpreter Needed?: No  Information entered by :: Tiffany Hill,LPN  Past Medical History:  Diagnosis Date  . Arthritis   . Glaucoma   . Hx of basal cell carcinoma    R cheek, R dorsal lat. distal forearm  . Hx of dysplastic nevus    multiple sites  . Hx of squamous cell carcinoma of skin 11/01/2009   L medial mid calf  . Hyperlipidemia   . Hypothyroidism   . Insomnia   . Left hip pain   . Protein calorie malnutrition ([place])    Past Surgical History:  Procedure Laterality Date  . COLONOSCOPY WITH PROPOFOL N/A 12/31/2017   Procedure: COLONOSCOPY WITH PROPOFOL;  Surgeon: Manya Silvas, MD;  Location: [facility] ENDOSCOPY;  Service: Endoscopy;  Laterality: N/A;  . HERNIA REPAIR    . TOTAL ABDOMINAL HYSTERECTOMY  2009   Family History  Problem Relation Age of Onset  . Diabetes Mellitus II Mother   . Stroke Father   . Stroke Maternal Grandmother   . Heart disease Paternal Grandfather   . Breast cancer Sister 41   Social History   Socioeconomic History  .  Marital status: Widowed    Spouse name: Not on file  . Number of children: Not on file  . Years of education: Not on file  . Highest education level: High school graduate  Occupational History  . Not on file  Social Needs  . Financial resource strain: Not hard at all  . Food insecurity    Worry: Never true    Inability: Never true  . Transportation needs    Medical: No    Non-medical: No  Tobacco Use  . Smoking status: Never Smoker  . Smokeless tobacco: Never Used  Substance and Sexual Activity  . Alcohol use: No    Alcohol/week: 0.0 standard drinks  . Drug use: No  . Sexual activity: Not on  file  Lifestyle  . Physical activity    Days per week: 0 days    Minutes per session: 0 min  . Stress: Not at all  Relationships  . Social connections    Talks on phone: More than three times a week    Gets together: More than three times a week    Attends religious service: More than 4 times per year    Active member of club or organization: No    Attends meetings of clubs or organizations: Never    Relationship status: Widowed  Other Topics Concern  . Not on file  Social History Narrative  . Not on file    Outpatient Encounter Medications as of 11/19/2018  Medication Sig  . Cholecalciferol (VITAMIN D) 2000 UNITS tablet Take 2,000 Units by mouth daily.  . diphenhydrAMINE (BENADRYL) 25 MG tablet Take 25 mg by mouth every 6 (six) hours as needed.  . dorzolamide-timolol (COSOPT) 22.3-6.8 MG/ML ophthalmic solution Place 1 drop into both eyes 2 (two) times daily.   Marland Kitchen lactose free nutrition (BOOST) LIQD Take 237 mLs by mouth 3 (three) times daily between meals.  Marland Kitchen levothyroxine (SYNTHROID, LEVOTHROID) 50 MCG tablet TAKE 1 TABLET BY MOUTH EVERY DAY BEFORE BREAKFAST  . metFORMIN (GLUCOPHAGE-XR) 500 MG 24 hr tablet TAKE 1 TABLET(500 MG) BY MOUTH DAILY WITH BREAKFAST  . Multiple Vitamin (MULTIVITAMIN) tablet Take 1 tablet by mouth daily.  Glory Rosebush DELICA LANCETS FINE MISC 1 each by Other route 3 (three) times daily.  Glory Rosebush VERIO test strip TEST BLOOD SUGAR UP TO ONCE DAILY  . rosuvastatin (CRESTOR) 5 MG tablet TAKE 1 TABLET BY MOUTH AT BEDTIME   No facility-administered encounter medications on file as of 11/19/2018.     Activities of Daily Living In your present state of health, do you have any difficulty performing the following activities: 11/19/2018  Hearing? Y  Comment no hearing aids  Vision? Y  Comment cataracts, glaucoma, wears glasses for reading  Difficulty concentrating or making decisions? N  Walking or climbing stairs? N  Dressing or bathing? N  Doing errands,  shopping? N  Preparing Food and eating ? N  Using the Toilet? N  In the past six months, have you accidently leaked urine? N  Do you have problems with loss of bowel control? N  Managing your Medications? N  Managing your Finances? N  Housekeeping or managing your Housekeeping? N  Some recent data might be hidden    Patient Care Team: Olin Hauser, DO as PCP - General (Family Medicine)    Assessment:   This is a routine wellness examination for Barbara Santos.  Exercise Activities and Dietary recommendations Current Exercise Habits: The patient does not participate in regular exercise at present,  Exercise limited by: None identified  Goals    . Gain weight     Little weight gain by next year    . Gain weight     Some weight gain     . Gain weight       Fall Risk: Fall Risk  11/19/2018 09/19/2018 03/20/2018 12/18/2017 11/13/2017  Falls in the past year? 1 0 0 No Yes  Number falls in past yr: 0 - - - 1  Injury with Fall? 0 - - - No  Risk for fall due to : - - - - History of fall(s)  Follow up - Falls evaluation completed Falls evaluation completed - Falls evaluation completed;Falls prevention discussed    FALL RISK PREVENTION PERTAINING TO THE HOME:  Any stairs in or around the home? No  If so, are there any without handrails? No   Home free of loose throw rugs in walkways, pet beds, electrical cords, etc? Yes  Adequate lighting in your home to reduce risk of falls? Yes   ASSISTIVE DEVICES UTILIZED TO PREVENT FALLS:  Life alert? Yes  Use of a cane, walker or w/c? No  Grab bars in the bathroom? No  Shower chair or bench in shower? No  Elevated toilet seat or a handicapped toilet? No   TIMED UP AND GO:  Unable to perform   Depression Screen PHQ 2/9 Scores 11/19/2018 09/19/2018 03/20/2018 12/18/2017  PHQ - 2 Score 0 0 0 0  PHQ- 9 Score - - - -     Cognitive Function MMSE - Mini Mental State Exam 11/06/2014  Orientation to time 5  Orientation to Place 5   Registration 3  Attention/ Calculation 5  Recall 3  Language- name 2 objects 2  Language- repeat 1  Language- follow 3 step command 3  Language- read & follow direction 1  Write a sentence 1  Copy design 1  Total score 30     6CIT Screen 11/19/2018 11/13/2017 10/24/2016  What Year? 0 points 0 points 0 points  What month? 0 points 0 points 0 points  What time? 0 points 0 points 0 points  Count back from 20 0 points 0 points 0 points  Months in reverse 0 points 0 points 0 points  Repeat phrase 0 points 0 points 0 points  Total Score 0 0 0    Immunization History  Administered Date(s) Administered  . Influenza, High Dose Seasonal PF 01/05/2015, 02/24/2016, 01/16/2017, 01/14/2018  . Influenza-Unspecified 01/28/2014, 01/14/2018  . Pneumococcal Conjugate-13 12/03/2013  . Pneumococcal Polysaccharide-23 10/24/2016  . Tdap 12/03/2013  . Zoster 04/24/2010    Qualifies for Shingles Vaccine? Yes  Zostavax completed 04/24/2010. Due for Shingrix. Education has been provided regarding the importance of this vaccine. Pt has been advised to call insurance company to determine out of pocket expense. Advised may also receive vaccine at local pharmacy or Health Dept. Verbalized acceptance and understanding.  Tdap: up to date   Flu Vaccine: up to date   Pneumococcal Vaccine: up to date   Screening Tests Health Maintenance  Topic Date Due  . INFLUENZA VACCINE  11/23/2018  . FOOT EXAM  12/19/2018  . OPHTHALMOLOGY EXAM  02/01/2019  . URINE MICROALBUMIN  03/21/2019  . HEMOGLOBIN A1C  03/23/2019  . TETANUS/TDAP  12/04/2023  . DEXA SCAN  Completed  . PNA vac Low Risk Adult  Completed    Cancer Screenings:  Colorectal Screening:  No longer  Required   Mammogram: completed 07/09/2018   Bone Density:  Completed 04/24/2012  Lung Cancer Screening: (Low Dose CT Chest recommended if Age 45-80 years, 30 pack-year currently smoking OR have quit w/in 15years.) does not qualify.    Additional  Screening:  Hepatitis C Screening: does not qualify  Dental Screening: Recommended annual dental exams for proper oral hygiene   Community Resource Referral:  CRR required this visit?  No       Plan:  I have personally reviewed and addressed the Medicare Annual Wellness questionnaire and have noted the following in the patient's chart:  A. Medical and social history B. Use of alcohol, tobacco or illicit drugs  C. Current medications and supplements D. Functional ability and status E.  Nutritional status F.  Physical activity G. Advance directives H. List of other physicians I.  Hospitalizations, surgeries, and ER visits in previous 12 months J.  [place] such as hearing and vision if needed, cognitive and depression L. Referrals and appointments   In addition, I have reviewed and discussed with patient certain preventive protocols, quality metrics, and best practice recommendations. A written personalized care plan for preventive services as well as general preventive health recommendations were provided to patient.   Signed,    Bevelyn Ngo, LPN  05/12/8675 Nurse Health Advisor   Nurse Notes: none

## 2018-11-19 NOTE — Patient Instructions (Signed)
[name] , Thank you for taking time to come for your Medicare Wellness Visit. I appreciate your ongoing commitment to your health goals. Please review the following plan we discussed and let me know if I can assist you in the future.   Screening recommendations/referrals: Colonoscopy: no longer required  Mammogram: completed 07/09/2018 Bone Density: completed  Recommended yearly ophthalmology/optometry visit for glaucoma screening and checkup Recommended yearly dental visit for hygiene and checkup  Vaccinations: Influenza vaccine: up to date  Pneumococcal vaccine: up to date  Tdap vaccine: up to date  Shingles vaccine: shingrix eligible, check with your insurance company for coverage   Advanced directives: Please bring a copy of your health care power of attorney and living will to the office at your convenience.  Conditions/risks identified: diabetic- discussed chronic care management team   Next appointment: Follow up in one year for your annual wellness visit.    Preventive Care 81 Years and Older, Female Preventive care refers to lifestyle choices and visits with your health care provider that can promote health and wellness. What does preventive care include?  A yearly physical exam. This is also called an annual well check.  Dental exams once or twice a year.  Routine eye exams. Ask your health care provider how often you should have your eyes checked.  Personal lifestyle choices, including:  Daily care of your teeth and gums.  Regular physical activity.  Eating a healthy diet.  Avoiding tobacco and drug use.  Limiting alcohol use.  Practicing safe sex.  Taking low-dose aspirin every day.  Taking vitamin and mineral supplements as recommended by your health care provider. What happens during an annual well check? The services and screenings done by your health care provider during your annual well check will depend on your age, overall health, lifestyle  risk factors, and family history of disease. Counseling  Your health care provider may ask you questions about your:  Alcohol use.  Tobacco use.  Drug use.  Emotional well-being.  Home and relationship well-being.  Sexual activity.  Eating habits.  History of falls.  Memory and ability to understand (cognition).  Work and work Statistician.  Reproductive health. Screening  You may have the following tests or measurements:  Height, weight, and BMI.  Blood pressure.  Lipid and cholesterol levels. These may be checked every 5 years, or more frequently if you are over 80 years old.  Skin check.  Lung cancer screening. You may have this screening every year starting at age 4 if you have a 30-pack-year history of smoking and currently smoke or have quit within the past 15 years.  Fecal occult blood test (FOBT) of the stool. You may have this test every year starting at age 60.  Flexible sigmoidoscopy or colonoscopy. You may have a sigmoidoscopy every 5 years or a colonoscopy every 10 years starting at age 67.  Hepatitis C blood test.  Hepatitis B blood test.  Sexually transmitted disease (STD) testing.  Diabetes screening. This is done by checking your blood sugar (glucose) after you have not eaten for a while (fasting). You may have this done every 1-3 years.  Bone density scan. This is done to screen for osteoporosis. You may have this done starting at age 67.  Mammogram. This may be done every 1-2 years. Talk to your health care provider about how often you should have regular mammograms. Talk with your health care provider about your test results, treatment options, and if necessary, the need for more  tests. Vaccines  Your health care provider may recommend certain vaccines, such as:  Influenza vaccine. This is recommended every year.  Tetanus, diphtheria, and acellular pertussis (Tdap, Td) vaccine. You may need a Td booster every 10 years.  Zoster vaccine.  You may need this after age 26.  Pneumococcal 13-valent conjugate (PCV13) vaccine. One dose is recommended after age 64.  Pneumococcal polysaccharide (PPSV23) vaccine. One dose is recommended after age 64. Talk to your health care provider about which screenings and vaccines you need and how often you need them. This information is not intended to replace advice given to you by your health care provider. Make sure you discuss any questions you have with your health care provider. Document Released: 05/07/2015 Document Revised: 12/29/2015 Document Reviewed: 02/09/2015 Elsevier Interactive Patient Education  2017 [employer] Prevention in the Home Falls can cause injuries. They can happen to people of all ages. There are many things you can do to make your home safe and to help prevent falls. What can I do on the outside of my home?  Regularly fix the edges of walkways and driveways and fix any cracks.  Remove anything that might make you trip as you walk through a door, such as a raised step or threshold.  Trim any bushes or trees on the path to your home.  Use bright outdoor lighting.  Clear any walking paths of anything that might make someone trip, such as rocks or tools.  Regularly check to see if handrails are loose or broken. Make sure that both sides of any steps have handrails.  Any raised decks and porches should have guardrails on the edges.  Have any leaves, snow, or ice cleared regularly.  Use sand or salt on walking paths during winter.  Clean up any spills in your garage right away. This includes oil or grease spills. What can I do in the bathroom?  Use night lights.  Install grab bars by the toilet and in the tub and shower. Do not use towel bars as grab bars.  Use non-skid mats or decals in the tub or shower.  If you need to sit down in the shower, use a plastic, non-slip stool.  Keep the floor dry. Clean up any water that spills on the floor as soon  as it happens.  Remove soap buildup in the tub or shower regularly.  Attach bath mats securely with double-sided non-slip rug tape.  Do not have throw rugs and other things on the floor that can make you trip. What can I do in the bedroom?  Use night lights.  Make sure that you have a light by your bed that is easy to reach.  Do not use any sheets or blankets that are too big for your bed. They should not hang down onto the floor.  Have a firm chair that has side arms. You can use this for support while you get dressed.  Do not have throw rugs and other things on the floor that can make you trip. What can I do in the kitchen?  Clean up any spills right away.  Avoid walking on wet floors.  Keep items that you use a lot in easy-to-reach places.  If you need to reach something above you, use a strong step stool that has a grab bar.  Keep electrical cords out of the way.  Do not use floor polish or wax that makes floors slippery. If you must use wax, use non-skid floor  wax.  Do not have throw rugs and other things on the floor that can make you trip. What can I do with my stairs?  Do not leave any items on the stairs.  Make sure that there are handrails on both sides of the stairs and use them. Fix handrails that are broken or loose. Make sure that handrails are as long as the stairways.  Check any carpeting to make sure that it is firmly attached to the stairs. Fix any carpet that is loose or worn.  Avoid having throw rugs at the top or bottom of the stairs. If you do have throw rugs, attach them to the floor with carpet tape.  Make sure that you have a light switch at the top of the stairs and the bottom of the stairs. If you do not have them, ask someone to add them for you. What else can I do to help prevent falls?  Wear shoes that:  Do not have high heels.  Have rubber bottoms.  Are comfortable and fit you well.  Are closed at the toe. Do not wear sandals.  If  you use a stepladder:  Make sure that it is fully opened. Do not climb a closed stepladder.  Make sure that both sides of the stepladder are locked into place.  Ask someone to hold it for you, if possible.  Clearly mark and make sure that you can see:  Any grab bars or handrails.  First and last steps.  Where the edge of each step is.  Use tools that help you move around (mobility aids) if they are needed. These include:  Canes.  Walkers.  Scooters.  Crutches.  Turn on the lights when you go into a dark area. Replace any light bulbs as soon as they burn out.  Set up your furniture so you have a clear path. Avoid moving your furniture around.  If any of your floors are uneven, fix them.  If there are any pets around you, be aware of where they are.  Review your medicines with your doctor. Some medicines can make you feel dizzy. This can increase your chance of falling. Ask your doctor what other things that you can do to help prevent falls. This information is not intended to replace advice given to you by your health care provider. Make sure you discuss any questions you have with your health care provider. Document Released: 02/04/2009 Document Revised: 09/16/2015 Document Reviewed: 05/15/2014 Elsevier Interactive Patient Education  2017 [employer].

## 2018-12-03 ENCOUNTER — Ambulatory Visit: Payer: Medicare Other

## 2019-01-15 LAB — MICROALBUMIN, URINE: Microalb, Ur: NEGATIVE

## 2019-02-09 ENCOUNTER — Other Ambulatory Visit: Payer: Self-pay | Admitting: Family Medicine

## 2019-02-09 DIAGNOSIS — E119 Type 2 diabetes mellitus without complications: Secondary | ICD-10-CM

## 2019-02-13 ENCOUNTER — Other Ambulatory Visit: Payer: Self-pay | Admitting: Family Medicine

## 2019-02-13 DIAGNOSIS — E039 Hypothyroidism, unspecified: Secondary | ICD-10-CM

## 2019-02-27 DIAGNOSIS — H40053 Ocular hypertension, bilateral: Secondary | ICD-10-CM | POA: Diagnosis not present

## 2019-03-06 ENCOUNTER — Encounter: Payer: Self-pay | Admitting: Family Medicine

## 2019-03-06 DIAGNOSIS — H2513 Age-related nuclear cataract, bilateral: Secondary | ICD-10-CM | POA: Diagnosis not present

## 2019-03-06 LAB — HM DIABETES EYE EXAM

## 2019-03-10 ENCOUNTER — Other Ambulatory Visit: Payer: Self-pay

## 2019-03-10 ENCOUNTER — Other Ambulatory Visit: Payer: Medicare Other

## 2019-03-10 DIAGNOSIS — E440 Moderate protein-calorie malnutrition: Secondary | ICD-10-CM | POA: Diagnosis not present

## 2019-03-10 DIAGNOSIS — E1169 Type 2 diabetes mellitus with other specified complication: Secondary | ICD-10-CM

## 2019-03-10 DIAGNOSIS — R030 Elevated blood-pressure reading, without diagnosis of hypertension: Secondary | ICD-10-CM

## 2019-03-10 DIAGNOSIS — E039 Hypothyroidism, unspecified: Secondary | ICD-10-CM

## 2019-03-10 DIAGNOSIS — Z0000 Encounter for general adult medical examination without abnormal findings: Secondary | ICD-10-CM

## 2019-03-10 DIAGNOSIS — E7800 Pure hypercholesterolemia, unspecified: Secondary | ICD-10-CM | POA: Diagnosis not present

## 2019-03-11 LAB — CBC WITH DIFFERENTIAL/PLATELET
Absolute Monocytes: 524 cells/uL (ref 200–950)
Basophils Absolute: 39 cells/uL (ref 0–200)
Basophils Relative: 0.8 %
Eosinophils Absolute: 152 cells/uL (ref 15–500)
Eosinophils Relative: 3.1 %
HCT: 41 % (ref 35.0–45.0)
Hemoglobin: 13.8 g/dL (ref 11.7–15.5)
Lymphs Abs: 1749 cells/uL (ref 850–3900)
MCH: 31.6 pg (ref 27.0–33.0)
MCHC: 33.7 g/dL (ref 32.0–36.0)
MCV: 93.8 fL (ref 80.0–100.0)
MPV: 10.8 fL (ref 7.5–12.5)
Monocytes Relative: 10.7 %
Neutro Abs: 2435 cells/uL (ref 1500–7800)
Neutrophils Relative %: 49.7 %
Platelets: 248 10*3/uL (ref 140–400)
RBC: 4.37 10*6/uL (ref 3.80–5.10)
RDW: 11.9 % (ref 11.0–15.0)
Total Lymphocyte: 35.7 %
WBC: 4.9 10*3/uL (ref 3.8–10.8)

## 2019-03-11 LAB — COMPLETE METABOLIC PANEL WITH GFR
AG Ratio: 1.8 (calc) (ref 1.0–2.5)
ALT: 16 U/L (ref 6–29)
AST: 19 U/L (ref 10–35)
Albumin: 4.4 g/dL (ref 3.6–5.1)
Alkaline phosphatase (APISO): 93 U/L (ref 37–153)
BUN: 15 mg/dL (ref 7–25)
CO2: 30 mmol/L (ref 20–32)
Calcium: 10.5 mg/dL — ABNORMAL HIGH (ref 8.6–10.4)
Chloride: 102 mmol/L (ref 98–110)
Creat: 0.71 mg/dL (ref 0.60–0.88)
GFR, Est African American: 93 mL/min/{1.73_m2} (ref >=60)
GFR, Est Non African American: 80 mL/min/{1.73_m2} (ref >=60)
Globulin: 2.5 g/dL (calc) (ref 1.9–3.7)
Glucose, Bld: 125 mg/dL — ABNORMAL HIGH (ref 65–99)
Potassium: 4.8 mmol/L (ref 3.5–5.3)
Sodium: 138 mmol/L (ref 135–146)
Total Bilirubin: 0.6 mg/dL (ref 0.2–1.2)
Total Protein: 6.9 g/dL (ref 6.1–8.1)

## 2019-03-11 LAB — LIPID PANEL
Cholesterol: 119 mg/dL (ref <200)
HDL: 61 mg/dL (ref >=50)
LDL Cholesterol (Calc): 44 mg/dL (calc)
Non-HDL Cholesterol (Calc): 58 mg/dL (calc) (ref <130)
Total CHOL/HDL Ratio: 2 (calc) (ref <5.0)
Triglycerides: 59 mg/dL (ref <150)

## 2019-03-11 LAB — HEMOGLOBIN A1C
Hgb A1c MFr Bld: 6.8 % of total Hgb — ABNORMAL HIGH (ref <5.7)
Mean Plasma Glucose: 148 (calc)
eAG (mmol/L): 8.2 (calc)

## 2019-03-11 LAB — TSH: TSH: 2.96 mIU/L (ref 0.40–4.50)

## 2019-03-12 DIAGNOSIS — L820 Inflamed seborrheic keratosis: Secondary | ICD-10-CM | POA: Diagnosis not present

## 2019-03-12 DIAGNOSIS — Z1283 Encounter for screening for malignant neoplasm of skin: Secondary | ICD-10-CM | POA: Diagnosis not present

## 2019-03-12 DIAGNOSIS — L700 Acne vulgaris: Secondary | ICD-10-CM | POA: Diagnosis not present

## 2019-03-12 DIAGNOSIS — Z86007 Personal history of in-situ neoplasm of skin: Secondary | ICD-10-CM | POA: Diagnosis not present

## 2019-03-12 DIAGNOSIS — Z85828 Personal history of other malignant neoplasm of skin: Secondary | ICD-10-CM | POA: Diagnosis not present

## 2019-03-17 ENCOUNTER — Ambulatory Visit (INDEPENDENT_AMBULATORY_CARE_PROVIDER_SITE_OTHER): Payer: Medicare Other | Admitting: Family Medicine

## 2019-03-17 ENCOUNTER — Encounter: Payer: Self-pay | Admitting: Family Medicine

## 2019-03-17 ENCOUNTER — Other Ambulatory Visit: Payer: Self-pay

## 2019-03-17 VITALS — BP 138/70 | HR 59 | Temp 98.2°F | Resp 16 | Ht 61.0 in | Wt 97.0 lb

## 2019-03-17 DIAGNOSIS — E1169 Type 2 diabetes mellitus with other specified complication: Secondary | ICD-10-CM | POA: Diagnosis not present

## 2019-03-17 DIAGNOSIS — E039 Hypothyroidism, unspecified: Secondary | ICD-10-CM

## 2019-03-17 DIAGNOSIS — R030 Elevated blood-pressure reading, without diagnosis of hypertension: Secondary | ICD-10-CM

## 2019-03-17 DIAGNOSIS — Z0000 Encounter for general adult medical examination without abnormal findings: Secondary | ICD-10-CM

## 2019-03-17 DIAGNOSIS — E440 Moderate protein-calorie malnutrition: Secondary | ICD-10-CM | POA: Diagnosis not present

## 2019-03-17 NOTE — Assessment & Plan Note (Signed)
A1c up to 6.8, still at goal < 7 Complicated by hypothyroidism and hyperlipidemia Last urine microalbumin negative 2020  Plan:  Continue Metformin XR 500mg  daily Encourage improved lifestyle - continue DM diet but high protein may inc calorie intake and continue boost Check CBG, bring log to next visit for review Not on ACEi/ARB - negative urine microalbumin - previously Continue Statin - nightly / defer off ASA

## 2019-03-17 NOTE — Assessment & Plan Note (Signed)
Elevated BP Repeat manual check improved Encouraged lifestyle improvement BP monitoring

## 2019-03-17 NOTE — Progress Notes (Signed)
Subjective:    Patient ID: Barbara Santos, female    DOB: Oct 22, 1935, 83 y.o.   MRN: [id number]  Barbara Santos is a 83 y.o. female presenting on 03/17/2019 for Annual Exam   HPI   Here for Annual Physical and Lab Review.  CHRONIC DM, Type 2: Admits some fluctuation of sugar - Todayreports doing well on medicine overall CBGs: Avg120-130s, low>100, high200+ (2 hr after breakfast, due to stress reported), checks CBG 1-2 times daily. Has detailed CBG log with her. 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) UTDDM Eye visit  Eye Dr Sandra Cockayne Denies hypoglycemia  Elevated BP without HTN Reports avg readings SBP 120-130 / 60 home Bp readings in AM Current Meds -none Denies CP, dyspnea, HA, edema, dizziness / lightheadedness  Constipation  Health Maintenance: Flu UTD 12/2018 UTD PNA vaccine  Depression screen [facility] 2/9 11/19/2018 09/19/2018 03/20/2018  Decreased Interest 0 0 0  Down, Depressed, Hopeless 0 0 0  PHQ - 2 Score 0 0 0  Altered sleeping - - -  Tired, decreased energy - - -  Change in appetite - - -  Feeling bad or failure about yourself  - - -  Trouble concentrating - - -  Moving slowly or fidgety/restless - - -  Suicidal thoughts - - -  PHQ-9 Score - - -  Difficult doing work/chores - - -    Past Medical History:  Diagnosis Date  . Arthritis   . Glaucoma   . Hx of basal cell carcinoma    R cheek, R dorsal lat. distal forearm  . Hx of dysplastic nevus    multiple sites  . Hx of squamous cell carcinoma of skin 11/01/2009   L medial mid calf  . Hyperlipidemia   . Hypothyroidism   . Insomnia   . Left hip pain   . Protein calorie malnutrition ([place])    Past Surgical History:  Procedure Laterality Date  . COLONOSCOPY  WITH PROPOFOL N/A 12/31/2017   Procedure: COLONOSCOPY WITH PROPOFOL;  Surgeon: Manya Silvas, MD;  Location: [facility] ENDOSCOPY;  Service: Endoscopy;  Laterality: N/A;  . HERNIA REPAIR    . TOTAL ABDOMINAL HYSTERECTOMY  2009   Social History   Socioeconomic History  . Marital status: Widowed    Spouse name: Not on file  . Number of children: Not on file  . Years of education: Not on file  . Highest education level: High school graduate  Occupational History  . Not on file  Social Needs  . Financial resource strain: Not hard at all  . Food insecurity    Worry: Never true    Inability: Never true  . Transportation needs    Medical: No    Non-medical: No  Tobacco Use  . Smoking status: Never Smoker  . Smokeless tobacco: Never Used  Substance and Sexual Activity  . Alcohol use: No    Alcohol/week: 0.0 standard drinks  . Drug use: No  . Sexual activity: Not on file  Lifestyle  . Physical activity    Days per week: 0 days    Minutes per session: 0 min  . Stress: Not at all  Relationships  . Social connections    Talks on phone: More than three times a week    Gets together: More than three times a week  Attends religious service: More than 4 times per year    Active member of club or organization: No    Attends meetings of clubs or organizations: Never    Relationship status: Widowed  . Intimate partner violence    Fear of current or ex partner: No    Emotionally abused: No    Physically abused: No    Forced sexual activity: No  Other Topics Concern  . Not on file  Social History Narrative  . Not on file   Family History  Problem Relation Age of Onset  . Diabetes Mellitus II Mother   . Stroke Father   . Stroke Maternal Grandmother   . Heart disease Paternal Grandfather   . Breast cancer Sister 53   Current Outpatient Medications on File Prior to Visit  Medication Sig  . Cholecalciferol (VITAMIN D) 2000 UNITS tablet Take 2,000 Units by mouth daily.  .  diphenhydrAMINE (BENADRYL) 25 MG tablet Take 25 mg by mouth every 6 (six) hours as needed.  . dorzolamide-timolol (COSOPT) 22.3-6.8 MG/ML ophthalmic solution Place 1 drop into both eyes 2 (two) times daily.   Marland Kitchen lactose free nutrition (BOOST) LIQD Take 237 mLs by mouth 3 (three) times daily between meals.  Marland Kitchen levothyroxine (SYNTHROID) 50 MCG tablet TAKE 1 TABLET BY MOUTH  EVERY DAY BEFORE BREAKFAST  . metFORMIN (GLUCOPHAGE-XR) 500 MG 24 hr tablet TAKE 1 TABLET(500 MG) BY MOUTH DAILY WITH BREAKFAST  . Multiple Vitamin (MULTIVITAMIN) tablet Take 1 tablet by mouth daily.  Glory Rosebush DELICA LANCETS FINE MISC 1 each by Other route 3 (three) times daily.  Glory Rosebush VERIO test strip CHECK BLOOD SUGAR UP TO ONCE DAILY  . rosuvastatin (CRESTOR) 5 MG tablet TAKE 1 TABLET BY MOUTH AT BEDTIME   No current facility-administered medications on file prior to visit.     Review of Systems  Constitutional: Negative for activity change, appetite change, chills, diaphoresis, fatigue and fever.  HENT: Negative for congestion and hearing loss.   Eyes: Negative for discharge and visual disturbance.  Respiratory: Negative for cough, chest tightness, shortness of breath and wheezing.   Cardiovascular: Negative for chest pain, palpitations and leg swelling.  Gastrointestinal: Negative for abdominal pain, constipation, diarrhea, nausea and vomiting.  Endocrine: Negative for cold intolerance.  Genitourinary: Negative for dysuria, frequency and hematuria.  Musculoskeletal: Negative for arthralgias and neck pain.  Skin: Negative for rash.  Allergic/Immunologic: Negative for environmental allergies.  Neurological: Negative for dizziness, weakness, light-headedness, numbness and headaches.  Hematological: Negative for adenopathy.  Psychiatric/Behavioral: Negative for behavioral problems, dysphoric mood and sleep disturbance.   Per HPI unless specifically indicated above      Objective:    BP 138/70 (BP Location:  Left Arm, Cuff Size: Normal)   Pulse (!) 59   Temp 98.2 F (36.8 C) (Oral)   Resp 16   Ht 5\' 1"  (1.549 m)   Wt 97 lb (44 kg)   BMI 18.33 kg/m   Wt Readings from Last 3 Encounters:  03/17/19 97 lb (44 kg)  03/20/18 97 lb 12.8 oz (44.4 kg)  12/31/17 96 lb (43.5 kg)    Physical Exam Vitals signs and nursing note reviewed.  Constitutional:      General: She is not in acute distress.    Appearance: She is well-developed. She is not diaphoretic.     Comments: Well-appearing, comfortable, cooperative  HENT:     Head: Normocephalic and atraumatic.  Eyes:     General:  Right eye: No discharge.        Left eye: No discharge.     Conjunctiva/sclera: Conjunctivae normal.     Pupils: Pupils are equal, round, and reactive to light.  Neck:     Musculoskeletal: Normal range of motion and neck supple.     Thyroid: No thyromegaly.  Cardiovascular:     Rate and Rhythm: Normal rate and regular rhythm.     Heart sounds: Normal heart sounds. No murmur.  Pulmonary:     Effort: Pulmonary effort is normal. No respiratory distress.     Breath sounds: Normal breath sounds. No wheezing or rales.  Abdominal:     General: Bowel sounds are normal. There is no distension.     Palpations: Abdomen is soft. There is no mass.     Tenderness: There is no abdominal tenderness.  Musculoskeletal: Normal range of motion.        General: No tenderness.     Comments: Upper / Lower Extremities: - Normal muscle tone, strength bilateral upper extremities 5/5, lower extremities 5/5  Lymphadenopathy:     Cervical: No cervical adenopathy.  Skin:    General: Skin is warm and dry.     Findings: No erythema or rash.  Neurological:     Mental Status: She is alert and oriented to person, place, and time.     Comments: Distal sensation intact to light touch all extremities  Psychiatric:        Behavior: Behavior normal.     Comments: Well groomed, good eye contact, normal speech and thoughts      Diabetic  Foot Exam - Simple   Simple Foot Form Diabetic Foot exam was performed with the following findings: Yes 03/17/2019  9:47 AM  Visual Inspection See comments: Yes Sensation Testing Intact to touch and monofilament testing bilaterally: Yes Pulse Check Posterior Tibialis and Dorsalis pulse intact bilaterally: Yes Comments Toenails thickened bilateral. No ulceration or callus formation    Recent Labs    09/20/18 0816 03/10/19 0810  HGBA1C 6.5* 6.8*    Results for orders placed or performed in visit on 03/17/19  Microalbumin, urine  Result Value Ref Range   Microalb, Ur negative       Assessment & Plan:   Problem List Items Addressed This Visit    Type 2 diabetes mellitus with other specified complication (HCC)    123456 up to 6.8, still at goal < 7 Complicated by hypothyroidism and hyperlipidemia Last urine microalbumin negative 2020  Plan:  Continue Metformin XR 500mg  daily Encourage improved lifestyle - continue DM diet but high protein may inc calorie intake and continue boost Check CBG, bring log to next visit for review Not on ACEi/ARB - negative urine microalbumin - previously Continue Statin - nightly / defer off ASA      Protein calorie malnutrition ([place])    Maintained weight On protein supplement      Hypothyroidism   Elevated BP without diagnosis of hypertension    Elevated BP Repeat manual check improved Encouraged lifestyle improvement BP monitoring       Other Visit Diagnoses    Annual physical exam    -  Primary      Updated Health Maintenance information Reviewed recent lab results with patient Encouraged improvement to lifestyle with diet and exercise Maintain weight, concern protein calorie malnutrition - on diet protein supplement daily  No orders of the defined types were placed in this encounter.    Follow up plan: Return in about  6 months (around 09/14/2019) for 6 month follow-up DM A1c, HTN.  Nobie Putnam, [facility] 03/17/2019, 9:35 AM

## 2019-03-17 NOTE — Patient Instructions (Addendum)
Thank you for coming to the office today.  Recent Labs    09/20/18 0816 03/10/19 0810  HGBA1C 6.5* 6.8*    Mild increased A1c sugar. But overall big picture it is fine, I am not worried about it. Keep up good work with lifestyle as you are and sugar will maintain. No change on medicine at this time.  Monitor BP at home, let me know if need to make any other change, can order BP med in future.   Please schedule a Follow-up Appointment to: Return in about 6 months (around 09/14/2019) for 6 month follow-up DM A1c, HTN.  If you have any other questions or concerns, please feel free to call the office or send a message through [place]. You may also schedule an earlier appointment if necessary.  Additionally, you may be receiving a survey about your experience at our office within a few days to 1 week by e-mail or mail. We value your feedback.  Nobie Putnam, DO [place]

## 2019-03-17 NOTE — Assessment & Plan Note (Signed)
Maintained weight On protein supplement

## 2019-04-23 ENCOUNTER — Ambulatory Visit: Payer: Medicare Other | Attending: Internal Medicine

## 2019-04-23 DIAGNOSIS — Z03818 Encounter for observation for suspected exposure to other biological agents ruled out: Secondary | ICD-10-CM | POA: Diagnosis not present

## 2019-04-29 ENCOUNTER — Other Ambulatory Visit: Payer: Self-pay | Admitting: Family Medicine

## 2019-04-29 DIAGNOSIS — E1169 Type 2 diabetes mellitus with other specified complication: Secondary | ICD-10-CM

## 2019-05-13 DIAGNOSIS — R69 Illness, unspecified: Secondary | ICD-10-CM | POA: Diagnosis not present

## 2019-05-19 ENCOUNTER — Telehealth: Payer: Self-pay | Admitting: Family Medicine

## 2019-05-19 NOTE — Chronic Care Management (AMB) (Signed)
  Chronic Care Management   Note  05/19/2019 Name: Barbara Santos MRN: [id number] DOB: 1935/07/19  Barbara Santos is a 84 y.o. year old female who is a primary care patient of Olin Hauser, DO. I reached out to Brien Few by phone today in response to a referral sent by Barbara Santos's health plan.     Barbara Santos was given information about Chronic Care Management services today including:  1. CCM service includes personalized support from designated clinical staff supervised by her physician, including individualized plan of care and coordination with other care providers 2. 24/7 contact phone numbers for assistance for urgent and routine care needs. 3. Service will only be billed when office clinical staff spend 20 minutes or more in a month to coordinate care. 4. Only one practitioner may furnish and bill the service in a calendar month. 5. The patient may stop CCM services at any time (effective at the end of the month) by phone call to the office staff. 6. The patient will be responsible for cost sharing (co-pay) of up to 20% of the service fee (after annual deductible is met).  Patient did not agree to enrollment in care management services and does not wish to consider at this time.  Follow up plan: The patient has been provided with contact information for the care management team and has been advised to call with any health related questions or concerns.   Barbara Santos, Barbara Santos, Barbara Santos, Barbara Santos [id number] Direct Dial: [phone] [email or web address] Website: [email or web address]

## 2019-05-29 ENCOUNTER — Other Ambulatory Visit: Payer: Self-pay

## 2019-05-29 DIAGNOSIS — E039 Hypothyroidism, unspecified: Secondary | ICD-10-CM

## 2019-05-29 DIAGNOSIS — E7800 Pure hypercholesterolemia, unspecified: Secondary | ICD-10-CM

## 2019-05-29 DIAGNOSIS — E1169 Type 2 diabetes mellitus with other specified complication: Secondary | ICD-10-CM

## 2019-05-29 MED ORDER — METFORMIN HCL ER 500 MG PO TB24: 500.0000 mg | ORAL_TABLET | Freq: Every day | ORAL | 3 refills | Status: DC

## 2019-05-29 MED ORDER — LEVOTHYROXINE SODIUM 50 MCG PO TABS: 50.0000 ug | ORAL_TABLET | Freq: Every day | ORAL | 3 refills | Status: DC

## 2019-05-29 MED ORDER — ROSUVASTATIN CALCIUM 5 MG PO TABS: 5.0000 mg | ORAL_TABLET | Freq: Every day | ORAL | 3 refills | Status: DC

## 2019-06-17 DIAGNOSIS — R69 Illness, unspecified: Secondary | ICD-10-CM | POA: Diagnosis not present

## 2019-06-22 DIAGNOSIS — Z7984 Long term (current) use of oral hypoglycemic drugs: Secondary | ICD-10-CM | POA: Diagnosis not present

## 2019-06-22 DIAGNOSIS — E785 Hyperlipidemia, unspecified: Secondary | ICD-10-CM | POA: Diagnosis not present

## 2019-06-22 DIAGNOSIS — H409 Unspecified glaucoma: Secondary | ICD-10-CM | POA: Diagnosis not present

## 2019-06-22 DIAGNOSIS — I951 Orthostatic hypotension: Secondary | ICD-10-CM | POA: Diagnosis not present

## 2019-06-22 DIAGNOSIS — Z809 Family history of malignant neoplasm, unspecified: Secondary | ICD-10-CM | POA: Diagnosis not present

## 2019-06-22 DIAGNOSIS — Z8249 Family history of ischemic heart disease and other diseases of the circulatory system: Secondary | ICD-10-CM | POA: Diagnosis not present

## 2019-06-22 DIAGNOSIS — R030 Elevated blood-pressure reading, without diagnosis of hypertension: Secondary | ICD-10-CM | POA: Diagnosis not present

## 2019-06-22 DIAGNOSIS — Z823 Family history of stroke: Secondary | ICD-10-CM | POA: Diagnosis not present

## 2019-06-22 DIAGNOSIS — E039 Hypothyroidism, unspecified: Secondary | ICD-10-CM | POA: Diagnosis not present

## 2019-06-22 DIAGNOSIS — E119 Type 2 diabetes mellitus without complications: Secondary | ICD-10-CM | POA: Diagnosis not present

## 2019-07-28 ENCOUNTER — Other Ambulatory Visit: Payer: Self-pay | Admitting: Family Medicine

## 2019-07-28 DIAGNOSIS — Z1231 Encounter for screening mammogram for malignant neoplasm of breast: Secondary | ICD-10-CM

## 2019-08-17 DIAGNOSIS — R69 Illness, unspecified: Secondary | ICD-10-CM | POA: Diagnosis not present

## 2019-08-18 ENCOUNTER — Ambulatory Visit
Admission: RE | Admit: 2019-08-18 | Discharge: 2019-08-18 | Disposition: A | Discharge status: Home / Self Care | Payer: Medicare HMO | Source: Ambulatory Visit | Attending: Family Medicine | Admitting: Family Medicine

## 2019-08-18 DIAGNOSIS — Z1231 Encounter for screening mammogram for malignant neoplasm of breast: Secondary | ICD-10-CM | POA: Insufficient documentation

## 2019-08-21 ENCOUNTER — Other Ambulatory Visit: Payer: Self-pay | Admitting: Family Medicine

## 2019-08-21 DIAGNOSIS — E7800 Pure hypercholesterolemia, unspecified: Secondary | ICD-10-CM

## 2019-08-21 NOTE — Telephone Encounter (Signed)
Message sent to keep upcoming appointment.

## 2019-09-02 DIAGNOSIS — H40003 Preglaucoma, unspecified, bilateral: Secondary | ICD-10-CM | POA: Diagnosis not present

## 2019-09-15 ENCOUNTER — Other Ambulatory Visit: Payer: Self-pay

## 2019-09-15 ENCOUNTER — Encounter: Payer: Self-pay | Admitting: Family Medicine

## 2019-09-15 ENCOUNTER — Other Ambulatory Visit: Payer: Self-pay | Admitting: Family Medicine

## 2019-09-15 ENCOUNTER — Ambulatory Visit (INDEPENDENT_AMBULATORY_CARE_PROVIDER_SITE_OTHER): Payer: Medicare HMO | Admitting: Family Medicine

## 2019-09-15 VITALS — BP 130/66 | HR 59 | Temp 97.7°F | Resp 16 | Ht 61.0 in | Wt 93.6 lb

## 2019-09-15 DIAGNOSIS — E039 Hypothyroidism, unspecified: Secondary | ICD-10-CM

## 2019-09-15 DIAGNOSIS — E440 Moderate protein-calorie malnutrition: Secondary | ICD-10-CM

## 2019-09-15 DIAGNOSIS — R030 Elevated blood-pressure reading, without diagnosis of hypertension: Secondary | ICD-10-CM

## 2019-09-15 DIAGNOSIS — E7800 Pure hypercholesterolemia, unspecified: Secondary | ICD-10-CM

## 2019-09-15 DIAGNOSIS — E1169 Type 2 diabetes mellitus with other specified complication: Secondary | ICD-10-CM

## 2019-09-15 DIAGNOSIS — Z0000 Encounter for general adult medical examination without abnormal findings: Secondary | ICD-10-CM

## 2019-09-15 DIAGNOSIS — B373 Candidiasis of vulva and vagina: Secondary | ICD-10-CM | POA: Diagnosis not present

## 2019-09-15 DIAGNOSIS — B3731 Acute candidiasis of vulva and vagina: Secondary | ICD-10-CM

## 2019-09-15 LAB — POCT GLYCOSYLATED HEMOGLOBIN (HGB A1C): Hemoglobin A1C: 6.9 % — AB (ref 4.0–5.6)

## 2019-09-15 MED ORDER — FLUCONAZOLE 150 MG PO TABS: ORAL_TABLET | ORAL | 0 refills | Status: DC

## 2019-09-15 MED ORDER — ROSUVASTATIN CALCIUM 5 MG PO TABS: 5.0000 mg | ORAL_TABLET | Freq: Every day | ORAL | 3 refills | Status: DC

## 2019-09-15 NOTE — Assessment & Plan Note (Signed)
Normal Bp range Home readings reviewed Avg 120-140

## 2019-09-15 NOTE — Assessment & Plan Note (Addendum)
Controlled cholesterol on statin and lifestyle Calculated ASCVD 10 yr risk score >30%  Plan: 1. Continue Rosuvastatin 5mg nightly - Defer ASA - history of easy bleeding on trial years ago - Follow-up yearly lipids 

## 2019-09-15 NOTE — Assessment & Plan Note (Signed)
A1c up to 6.9, but overall reviewed goal 7-8 Complicated by hypothyroidism and hyperlipidemia Last urine microalbumin negative 2020  Plan:  Continue Metformin XR 500mg  daily Encourage improved lifestyle - continue DM diet but high protein may inc calorie intake and continue boost - EMPHASIS ON WEIGHT GAIN TODAY, and acceptable to allow A1c / CBGs to increase to 7+ range Check CBG, bring log to next visit for review Not on ACEi/ARB - negative urine microalbumin - previously Continue Statin

## 2019-09-15 NOTE — Patient Instructions (Addendum)
Thank you for coming to the office today.  Recent Labs    09/20/18 0816 03/10/19 0810 09/15/19 0933  HGBA1C 6.5* 6.8* 6.9*   Work on improving diet in order to gain some weight back. High protein foods. Can still limit some carbs.  Goal sugar average 150 to 175 is very reasonable. Avoid >200.  A1c from 6 to 8 is acceptable.   DUE for FASTING BLOOD WORK (no food or drink after midnight before the lab appointment, only water or coffee without cream/sugar on the morning of)  SCHEDULE "Lab Only" visit in the morning at the clinic for lab draw in 6 MONTHS   - Make sure Lab Only appointment is at about 1 week before your next appointment, so that results will be available  For Lab Results, once available within 2-3 days of blood draw, you can can log in to MyChart online to view your results and a brief explanation. Also, we can discuss results at next follow-up visit.   Please schedule a Follow-up Appointment to: Return in about 6 months (around 03/17/2020) for Annual Physical.  If you have any other questions or concerns, please feel free to call the office or send a message through [place]. You may also schedule an earlier appointment if necessary.  Additionally, you may be receiving a survey about your experience at our office within a few days to 1 week by e-mail or mail. We value your feedback.  Nobie Putnam, DO [place]

## 2019-09-15 NOTE — Progress Notes (Signed)
Subjective:    Patient ID: Barbara Santos, female    DOB: [birth year], 84 y.o.   MRN: [id number]  Barbara Santos is a 84 y.o. female presenting on 09/15/2019 for Diabetes   HPI   CHRONIC DM, Type 2: Home CBG readings. Reviewed. She has concern with trying to control sugar and not eating as much but concern with weight loss - Todayreports doing well on medicine overall CBGs: Avg130-150s, low>82 (asymptomatic) high200+ very rarely, checks CBG 1-2 times daily. 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) UTDDM Eye visit [facility] Dr Sandra Cockayne Denies hypoglycemia  Hyperlipidemia Controlled on last lipid panel On Rosuvastatin 5mg  nightly needs refill  Elevated BP without HTN Reports avg readings SBP 120-140 / 60 home Bp readings in AM Current Meds -none Denies CP, dyspnea, HA, edema, dizziness / lightheadedness  Protein Calorie Malnutrition, moderate Weight loss Today review scales with weight down 3 lbs in 6 months, she attributes some to diet intake  Vaginitis Admits some vaginal burning occasional, history of yeast infection, she uses OTC cream with relief but not resolving currently. Denies urinary frequency or dysuria or hematuria  Health Maintenance: Flu UTD 12/2018 UTD PNA vaccine  Depression screen [facility] 2/9 09/15/2019 11/19/2018 09/19/2018  Decreased Interest 0 0 0  Down, Depressed, Hopeless 0 0 0  PHQ - 2 Score 0 0 0  Altered sleeping - - -  Tired, decreased energy - - -  Change in appetite - - -  Feeling bad or failure about yourself  - - -  Trouble concentrating - - -  Moving slowly or fidgety/restless - - -  Suicidal thoughts - - -  PHQ-9 Score - - -  Difficult doing work/chores - - -    Social History   Tobacco Use  . Smoking  status: Never Smoker  . Smokeless tobacco: Never Used  Substance Use Topics  . Alcohol use: No    Alcohol/week: 0.0 standard drinks  . Drug use: No    Review of Systems Per HPI unless specifically indicated above     Objective:    BP 130/66   Pulse (!) 59   Temp 97.7 F (36.5 C) (Temporal)   Resp 16   Ht 5\' 1"  (1.549 m)   Wt 93 lb 9.6 oz (42.5 kg)   SpO2 100%   BMI 17.69 kg/m   Wt Readings from Last 3 Encounters:  09/15/19 93 lb 9.6 oz (42.5 kg)  03/17/19 97 lb (44 kg)  03/20/18 97 lb 12.8 oz (44.4 kg)    Physical Exam Vitals and nursing note reviewed.  Constitutional:      General: She is not in acute distress.    Appearance: She is well-developed. She is not diaphoretic.     Comments: Well-appearing, comfortable, cooperative, thin appearing  HENT:     Head: Normocephalic and atraumatic.  Eyes:     General:        Right eye: No discharge.        Left eye: No discharge.     Conjunctiva/sclera: Conjunctivae normal.  Cardiovascular:     Rate and Rhythm: Normal rate.  Pulmonary:     Effort: Pulmonary effort is normal.  Skin:    General: Skin is warm and dry.     Findings: No erythema or rash.  Neurological:     Mental Status:  She is alert and oriented to person, place, and time.  Psychiatric:        Behavior: Behavior normal.     Comments: Well groomed, good eye contact, normal speech and thoughts      Results for orders placed or performed in visit on 09/15/19  POCT HgB A1C  Result Value Ref Range   Hemoglobin A1C 6.9 (A) 4.0 - 5.6 %      Assessment & Plan:   Problem List Items Addressed This Visit    Type 2 diabetes mellitus with other specified complication ([place]) - Primary    A1c up to 6.9, but overall reviewed goal 7-8 Complicated by hypothyroidism and hyperlipidemia Last urine microalbumin negative 2020  Plan:  Continue Metformin XR 500mg  daily Encourage improved lifestyle - continue DM diet but high protein may inc calorie intake and  continue boost - EMPHASIS ON WEIGHT GAIN TODAY, and acceptable to allow A1c / CBGs to increase to 7+ range Check CBG, bring log to next visit for review Not on ACEi/ARB - negative urine microalbumin - previously Continue Statin      Relevant Medications   rosuvastatin (CRESTOR) 5 MG tablet   Other Relevant Orders   POCT HgB A1C (Completed)   Protein calorie malnutrition (HCC)    Slight weight loss 3 lbs in 6 months Attributed to reduced diet intake, due to controlling sugar Goal to improve weight, improve nutrition protein/calories and allow sugar to increase somewhat as reviewed goal A1c 7-8       Elevated LDL cholesterol level    Controlled cholesterol on statin and lifestyle Calculated ASCVD 10 yr risk score >30%  Plan: 1. Continue Rosuvastatin 5mg  nightly - Defer ASA - history of easy bleeding on trial years ago - Follow-up yearly lipids      Relevant Medications   rosuvastatin (CRESTOR) 5 MG tablet   Elevated BP without diagnosis of hypertension    Normal Bp range Home readings reviewed Avg 120-140       Other Visit Diagnoses    Yeast vaginitis       Relevant Medications   fluconazole (DIFLUCAN) 150 MG tablet    Mild yeast vaginitis symptoms No history suggestive of UTI Treat empirically Diflucan today Follow-up PRN   Meds ordered this encounter  Medications  . rosuvastatin (CRESTOR) 5 MG tablet    Sig: Take 1 tablet (5 mg total) by mouth at bedtime.    Dispense:  90 tablet    Refill:  3  . fluconazole (DIFLUCAN) 150 MG tablet    Sig: Take one tablet by mouth on Day 1. Repeat dose 2nd tablet on Day 3.    Dispense:  2 tablet    Refill:  0     Follow up plan: Return in about 6 months (around 03/17/2020) for Annual Physical.  Future labs ordered for 03/10/20   Nobie Putnam, [facility] 09/15/2019, 9:32 AM

## 2019-09-15 NOTE — Assessment & Plan Note (Signed)
Slight weight loss 3 lbs in 6 months Attributed to reduced diet intake, due to controlling sugar Goal to improve weight, improve nutrition protein/calories and allow sugar to increase somewhat as reviewed goal A1c 7-8

## 2019-10-14 DIAGNOSIS — M4216 Adult osteochondrosis of spine, lumbar region: Secondary | ICD-10-CM | POA: Diagnosis not present

## 2019-11-11 DIAGNOSIS — R69 Illness, unspecified: Secondary | ICD-10-CM | POA: Diagnosis not present

## 2019-11-11 DIAGNOSIS — H2513 Age-related nuclear cataract, bilateral: Secondary | ICD-10-CM | POA: Diagnosis not present

## 2019-11-11 DIAGNOSIS — H43822 Vitreomacular adhesion, left eye: Secondary | ICD-10-CM | POA: Diagnosis not present

## 2019-11-11 LAB — HM DIABETES EYE EXAM

## 2019-11-28 ENCOUNTER — Encounter: Payer: Self-pay | Admitting: Family Medicine

## 2019-12-19 DIAGNOSIS — H2512 Age-related nuclear cataract, left eye: Secondary | ICD-10-CM | POA: Diagnosis not present

## 2019-12-19 DIAGNOSIS — E119 Type 2 diabetes mellitus without complications: Secondary | ICD-10-CM | POA: Diagnosis not present

## 2019-12-23 DIAGNOSIS — R69 Illness, unspecified: Secondary | ICD-10-CM | POA: Diagnosis not present

## 2019-12-24 ENCOUNTER — Encounter: Payer: Self-pay | Admitting: Ophthalmology

## 2019-12-24 ENCOUNTER — Other Ambulatory Visit: Payer: Self-pay

## 2019-12-26 ENCOUNTER — Inpatient Hospital Stay: Admission: RE | Admit: 2019-12-26 | Payer: Medicare HMO | Source: Ambulatory Visit

## 2019-12-30 ENCOUNTER — Other Ambulatory Visit: Payer: Self-pay

## 2019-12-30 ENCOUNTER — Other Ambulatory Visit
Admission: RE | Admit: 2019-12-30 | Discharge: 2019-12-30 | Disposition: A | Discharge status: Home / Self Care | Payer: Medicare HMO | Source: Ambulatory Visit | Attending: Ophthalmology | Admitting: Ophthalmology

## 2019-12-30 DIAGNOSIS — Z01812 Encounter for preprocedural laboratory examination: Secondary | ICD-10-CM | POA: Insufficient documentation

## 2019-12-30 DIAGNOSIS — Z20822 Contact with and (suspected) exposure to covid-19: Secondary | ICD-10-CM | POA: Diagnosis not present

## 2019-12-30 LAB — SARS CORONAVIRUS 2 (TAT 6-24 HRS): SARS Coronavirus 2: NEGATIVE

## 2019-12-30 NOTE — Discharge Instructions (Signed)

## 2019-12-31 ENCOUNTER — Ambulatory Visit: Payer: Medicare HMO | Admitting: Anesthesiology

## 2019-12-31 ENCOUNTER — Other Ambulatory Visit: Payer: Self-pay

## 2019-12-31 ENCOUNTER — Ambulatory Visit
Admission: RE | Admit: 2019-12-31 | Discharge: 2019-12-31 | Disposition: A | Discharge status: Home / Self Care | Payer: Medicare HMO | Attending: Ophthalmology | Admitting: Ophthalmology

## 2019-12-31 ENCOUNTER — Encounter: Payer: Self-pay | Admitting: Ophthalmology

## 2019-12-31 ENCOUNTER — Encounter
Admission: RE | Disposition: A | Discharge status: Home / Self Care | Payer: Self-pay | Source: Home / Self Care | Attending: Ophthalmology

## 2019-12-31 DIAGNOSIS — E119 Type 2 diabetes mellitus without complications: Secondary | ICD-10-CM | POA: Insufficient documentation

## 2019-12-31 DIAGNOSIS — Z7984 Long term (current) use of oral hypoglycemic drugs: Secondary | ICD-10-CM | POA: Insufficient documentation

## 2019-12-31 DIAGNOSIS — H25812 Combined forms of age-related cataract, left eye: Secondary | ICD-10-CM | POA: Diagnosis not present

## 2019-12-31 DIAGNOSIS — E039 Hypothyroidism, unspecified: Secondary | ICD-10-CM | POA: Diagnosis not present

## 2019-12-31 DIAGNOSIS — Z79890 Hormone replacement therapy (postmenopausal): Secondary | ICD-10-CM | POA: Diagnosis not present

## 2019-12-31 DIAGNOSIS — H2512 Age-related nuclear cataract, left eye: Secondary | ICD-10-CM | POA: Diagnosis not present

## 2019-12-31 DIAGNOSIS — E7800 Pure hypercholesterolemia, unspecified: Secondary | ICD-10-CM | POA: Diagnosis not present

## 2019-12-31 DIAGNOSIS — Z85828 Personal history of other malignant neoplasm of skin: Secondary | ICD-10-CM | POA: Diagnosis not present

## 2019-12-31 DIAGNOSIS — M1991 Primary osteoarthritis, unspecified site: Secondary | ICD-10-CM | POA: Insufficient documentation

## 2019-12-31 DIAGNOSIS — E1136 Type 2 diabetes mellitus with diabetic cataract: Secondary | ICD-10-CM | POA: Diagnosis present

## 2019-12-31 DIAGNOSIS — Z880 Allergy status to penicillin: Secondary | ICD-10-CM | POA: Diagnosis not present

## 2019-12-31 DIAGNOSIS — Z79899 Other long term (current) drug therapy: Secondary | ICD-10-CM | POA: Insufficient documentation

## 2019-12-31 HISTORY — DX: Type 2 diabetes mellitus without complications: E11.9

## 2019-12-31 HISTORY — PX: CATARACT EXTRACTION W/PHACO: SHX586

## 2019-12-31 LAB — GLUCOSE, CAPILLARY
Glucose-Capillary: 118 mg/dL — ABNORMAL HIGH (ref 70–99)
Glucose-Capillary: 114 mg/dL — ABNORMAL HIGH (ref 70–99)

## 2019-12-31 SURGERY — PHACOEMULSIFICATION, CATARACT, WITH IOL INSERTION
Anesthesia: Monitor Anesthesia Care | Site: Eye | Laterality: Left

## 2019-12-31 MED ORDER — EPINEPHRINE PF 1 MG/ML IJ SOLN
INTRAOCULAR | Status: DC | PRN
  Administered 2019-12-31: 71 mL via OPHTHALMIC

## 2019-12-31 MED ORDER — LIDOCAINE HCL (PF) 2 % IJ SOLN
INTRAOCULAR | Status: DC | PRN
  Administered 2019-12-31: 1 mL

## 2019-12-31 MED ORDER — ARMC OPHTHALMIC DILATING DROPS
1.0000 "application " | OPHTHALMIC | Status: DC | PRN
  Administered 2019-12-31 (×3): 1 via OPHTHALMIC

## 2019-12-31 MED ORDER — TETRACAINE HCL 0.5 % OP SOLN
1.0000 [drp] | OPHTHALMIC | Status: DC | PRN
  Administered 2019-12-31 (×3): 1 [drp] via OPHTHALMIC

## 2019-12-31 MED ORDER — NA HYALUR & NA CHOND-NA HYALUR 0.4-0.35 ML IO KIT
PACK | INTRAOCULAR | Status: DC | PRN
  Administered 2019-12-31: 1 mL via INTRAOCULAR

## 2019-12-31 MED ORDER — BRIMONIDINE TARTRATE-TIMOLOL 0.2-0.5 % OP SOLN
OPHTHALMIC | Status: DC | PRN
  Administered 2019-12-31: 1 [drp] via OPHTHALMIC

## 2019-12-31 MED ORDER — MOXIFLOXACIN HCL 0.5 % OP SOLN
1.0000 [drp] | OPHTHALMIC | Status: DC | PRN
  Administered 2019-12-31 (×3): 1 [drp] via OPHTHALMIC

## 2019-12-31 MED ORDER — MIDAZOLAM HCL 2 MG/2ML IJ SOLN
INTRAMUSCULAR | Status: DC | PRN
  Administered 2019-12-31: .5 mg via INTRAVENOUS

## 2019-12-31 MED ORDER — FENTANYL CITRATE (PF) 100 MCG/2ML IJ SOLN
INTRAMUSCULAR | Status: DC | PRN
  Administered 2019-12-31: 50 ug via INTRAVENOUS

## 2019-12-31 MED ORDER — MOXIFLOXACIN HCL 0.5 % OP SOLN
OPHTHALMIC | Status: DC | PRN
  Administered 2019-12-31: 0.2 mL via OPHTHALMIC

## 2019-12-31 SURGICAL SUPPLY — 23 items
CANNULA ANT/CHMB 27G (MISCELLANEOUS) ×1 IMPLANT
CANNULA ANT/CHMB 27GA (MISCELLANEOUS) ×3 IMPLANT
GLOVE SURG LX 7.5 STRW (GLOVE) ×2
GLOVE SURG LX STRL 7.5 STRW (GLOVE) ×1 IMPLANT
GLOVE SURG TRIUMPH 8.0 PF LTX (GLOVE) ×3 IMPLANT
GOWN STRL REUS W/ TWL LRG LVL3 (GOWN DISPOSABLE) ×2 IMPLANT
GOWN STRL REUS W/TWL LRG LVL3 (GOWN DISPOSABLE) ×6
LENS IOL DIOP 24.5 (Intraocular Lens) ×3 IMPLANT
LENS IOL TECNIS MONO 24.5 (Intraocular Lens) IMPLANT
MARKER SKIN DUAL TIP RULER LAB (MISCELLANEOUS) ×3 IMPLANT
NDL CAPSULORHEX 25GA (NEEDLE) ×1 IMPLANT
NDL FILTER BLUNT 18X1 1/2 (NEEDLE) ×2 IMPLANT
NEEDLE CAPSULORHEX 25GA (NEEDLE) ×3 IMPLANT
NEEDLE FILTER BLUNT 18X 1/2SAF (NEEDLE) ×4
NEEDLE FILTER BLUNT 18X1 1/2 (NEEDLE) ×2 IMPLANT
PACK CATARACT BRASINGTON (MISCELLANEOUS) ×3 IMPLANT
PACK EYE AFTER SURG (MISCELLANEOUS) ×3 IMPLANT
PACK OPTHALMIC (MISCELLANEOUS) ×3 IMPLANT
SOLUTION OPHTHALMIC SALT (MISCELLANEOUS) ×3 IMPLANT
SYR 3ML LL SCALE MARK (SYRINGE) ×6 IMPLANT
SYR TB 1ML LUER SLIP (SYRINGE) ×3 IMPLANT
WATER STERILE IRR 250ML POUR (IV SOLUTION) ×3 IMPLANT
WIPE NON LINTING 3.25X3.25 (MISCELLANEOUS) ×3 IMPLANT

## 2019-12-31 NOTE — Transfer of Care (Signed)
Immediate Anesthesia Transfer of Care Note  Patient: Barbara Santos  Procedure(s) Performed: CATARACT EXTRACTION PHACO AND INTRAOCULAR LENS PLACEMENT (IOC) LEFT DIABETIC 10.20  01:17.6  13.1% (Left Eye)  Patient Location: PACU  Anesthesia Type: MAC  Level of Consciousness: awake, alert  and patient cooperative  Airway and Oxygen Therapy: Patient Spontanous Breathing and Patient connected to supplemental oxygen  Post-op Assessment: Post-op Vital signs reviewed, Patient's Cardiovascular Status Stable, Respiratory Function Stable, Patent Airway and No signs of Nausea or vomiting  Post-op Vital Signs: Reviewed and stable  Complications: No complications documented.

## 2019-12-31 NOTE — Anesthesia Procedure Notes (Signed)
Procedure Name: MAC Performed by: Makinsley Schiavi, CRNA Pre-anesthesia Checklist: Patient identified, Emergency Drugs available, Suction available, Timeout performed and Patient being monitored Patient Re-evaluated:Patient Re-evaluated prior to induction Oxygen Delivery Method: Nasal cannula Placement Confirmation: positive ETCO2       

## 2019-12-31 NOTE — Anesthesia Postprocedure Evaluation (Signed)
Anesthesia Post Note  Patient: Barbara Santos  Procedure(s) Performed: CATARACT EXTRACTION PHACO AND INTRAOCULAR LENS PLACEMENT (IOC) LEFT DIABETIC 10.20  01:17.6  13.1% (Left Eye)     Anesthesia Post Evaluation No complications documented.  Maliki Gignac [employer]

## 2019-12-31 NOTE — Anesthesia Preprocedure Evaluation (Signed)
Anesthesia Evaluation  Patient identified by MRN, date of birth, ID band Patient awake    Reviewed: Allergy & Precautions, NPO status , Patient's Chart, lab work & pertinent test results  Airway Mallampati: II  TM Distance: >3 FB Neck ROM: Full  Mouth opening: Limited Mouth Opening  Dental no notable dental hx.    Pulmonary neg pulmonary ROS,    Pulmonary exam normal        Cardiovascular negative cardio ROS Normal cardiovascular exam     Neuro/Psych Cataracts negative psych ROS   GI/Hepatic negative GI ROS, Neg liver ROS,   Endo/Other  diabetes, Type 2Hypothyroidism   Renal/GU negative Renal ROS     Musculoskeletal  (+) Arthritis , Osteoarthritis,    Abdominal Normal abdominal exam  (+)   Peds  Hematology   Anesthesia Other Findings   Reproductive/Obstetrics                             Anesthesia Physical Anesthesia Plan  ASA: II  Anesthesia Plan: MAC   Post-op Pain Management:    Induction: Intravenous  PONV Risk Score and Plan: 2 and TIVA, Midazolam and Treatment may vary due to age or medical condition  Airway Management Planned: Nasal Cannula and Natural Airway  Additional Equipment: None  Intra-op Plan:   Post-operative Plan:   Informed Consent: I have reviewed the patients History and Physical, chart, labs and discussed the procedure including the risks, benefits and alternatives for the proposed anesthesia with the patient or authorized representative who has indicated his/her understanding and acceptance.     Dental advisory given  Plan Discussed with: CRNA  Anesthesia Plan Comments:         Anesthesia Quick Evaluation

## 2019-12-31 NOTE — Op Note (Signed)
OPERATIVE NOTE  Barbara Santos [id number] 12/31/2019   PREOPERATIVE DIAGNOSIS:  Nuclear sclerotic cataract left eye. H25.12   POSTOPERATIVE DIAGNOSIS:    Nuclear sclerotic cataract left eye.     PROCEDURE:  Phacoemusification with posterior chamber intraocular lens placement of the left eye  Ultrasound time: Procedure(s) with comments: CATARACT EXTRACTION PHACO AND INTRAOCULAR LENS PLACEMENT (IOC) LEFT DIABETIC 10.20  01:17.6  13.1% (Left) - Diabetic - oral meds  LENS:   Implant Name Type Inv. Item Serial No. Manufacturer Lot No. LRB No. Used Action  LENS IOL DIOP 24.5 - [id number] Intraocular Lens LENS IOL DIOP 24.5 [id number] [employer]  Left 1 Implanted      SURGEON:  Wyonia Hough, MD   ANESTHESIA:  Topical with tetracaine drops and 2% Xylocaine jelly, augmented with 1% preservative-free intracameral lidocaine.    COMPLICATIONS:  None.   DESCRIPTION OF PROCEDURE:  The patient was identified in the holding room and transported to the operating room and placed in the supine position under the operating microscope.  The left eye was identified as the operative eye and it was prepped and draped in the usual sterile ophthalmic fashion.   A 1 millimeter clear-corneal paracentesis was made at the 1:30 position.  0.5 ml of preservative-free 1% lidocaine was injected into the anterior chamber.  The anterior chamber was filled with Viscoat viscoelastic.  A 2.4 millimeter keratome was used to make a near-clear corneal incision at the 10:30 position.  .  A curvilinear capsulorrhexis was made with a cystotome and capsulorrhexis forceps.  Balanced salt solution was used to hydrodissect and hydrodelineate the nucleus.   Phacoemulsification was then used in stop and chop fashion to remove the lens nucleus and epinucleus.  The remaining cortex was then removed using the irrigation and aspiration handpiece. Provisc was then placed into the capsular bag to distend it for  lens placement.  A lens was then injected into the capsular bag.  The remaining viscoelastic was aspirated.   Wounds were hydrated with balanced salt solution.  The anterior chamber was inflated to a physiologic pressure with balanced salt solution.  No wound leaks were noted. Vigamox 0.2 ml of a 1mg  per ml solution was injected into the anterior chamber for a dose of 0.2 mg of intracameral antibiotic at the completion of the case.   Timolol and Brimonidine drops were applied to the eye.  The patient was taken to the recovery room in stable condition without complications of anesthesia or surgery.  [name] 12/31/2019, 7:59 AM

## 2019-12-31 NOTE — H&P (Signed)

## 2020-01-01 ENCOUNTER — Encounter: Payer: Self-pay | Admitting: Ophthalmology

## 2020-01-06 ENCOUNTER — Ambulatory Visit (INDEPENDENT_AMBULATORY_CARE_PROVIDER_SITE_OTHER): Payer: Medicare HMO

## 2020-01-06 VITALS — Ht 60.0 in | Wt 95.0 lb

## 2020-01-06 DIAGNOSIS — Z0000 Encounter for general adult medical examination without abnormal findings: Secondary | ICD-10-CM | POA: Diagnosis not present

## 2020-01-06 NOTE — Progress Notes (Signed)
I connected with Barbara Santos today by telephone and verified that I am speaking with the correct person using two identifiers. Location patient: home Location provider: work Persons participating in the virtual visit: [name], Glenna Durand LPN.   I discussed the limitations, risks, security and privacy concerns of performing an evaluation and management service by telephone and the availability of in person appointments. I also discussed with the patient that there may be a patient responsible charge related to this service. The patient expressed understanding and verbally consented to this telephonic visit.    Interactive audio and video telecommunications were attempted between this provider and patient, however failed, due to patient having technical difficulties OR patient did not have access to video capability.  We continued and completed visit with audio only.     Vital signs may be patient reported or missing.  Subjective:   Barbara Santos is a 84 y.o. female who presents for Medicare Annual (Subsequent) preventive examination.  Review of Systems     Cardiac Risk Factors include: advanced age (>36men, >75 women);diabetes mellitus     Objective:    Today's Vitals   01/06/20 1316  Weight: 95 lb (43.1 kg)  Height: 5' (1.524 m)   Body mass index is 18.55 kg/m.  Advanced Directives 01/06/2020 12/31/2019 11/19/2018 12/31/2017 11/13/2017 10/24/2016 11/06/2014  Does Patient Have a Medical Advance Directive? Yes Yes Yes Yes Yes Yes No  Type of Advance Directive Living will;Healthcare Power of [place];Living will Living will;Healthcare Power of Attorney Living will Living will;Healthcare Power of [place];Living will -  Does patient want to make changes to medical advance directive? - No - Patient declined - - - - -  Copy of [place] in Chart? No - copy requested Yes - validated most recent  copy scanned in chart (See row information) No - copy requested - No - copy requested No - copy requested -    Current Medications (verified) Outpatient Encounter Medications as of 01/06/2020  Medication Sig  . Cholecalciferol (VITAMIN D) 2000 UNITS tablet Take 2,000 Units by mouth daily.  . diphenhydrAMINE (BENADRYL) 25 MG tablet Take 25 mg by mouth every 6 (six) hours as needed.  . dorzolamide-timolol (COSOPT) 22.3-6.8 MG/ML ophthalmic solution Place 1 drop into both eyes 2 (two) times daily.   Marland Kitchen lactose free nutrition (BOOST) LIQD Take 237 mLs by mouth 3 (three) times daily between meals.  Marland Kitchen levothyroxine (SYNTHROID) 50 MCG tablet Take 1 tablet (50 mcg total) by mouth daily before breakfast.  . metFORMIN (GLUCOPHAGE-XR) 500 MG 24 hr tablet Take 1 tablet (500 mg total) by mouth daily with breakfast.  . Multiple Vitamin (MULTIVITAMIN) tablet Take 1 tablet by mouth daily.  Glory Rosebush DELICA LANCETS FINE MISC 1 each by Other route 3 (three) times daily.  Glory Rosebush VERIO test strip CHECK BLOOD SUGAR UP TO ONCE DAILY  . rosuvastatin (CRESTOR) 5 MG tablet Take 1 tablet (5 mg total) by mouth at bedtime.   No facility-administered encounter medications on file as of 01/06/2020.    Allergies (verified) Amoxicillin and Penicillins   History: Past Medical History:  Diagnosis Date  . Arthritis   . Diabetes mellitus, type 2 ([place])   . Glaucoma   . Hx of basal cell carcinoma    R cheek, R dorsal lat. distal forearm  . Hx of dysplastic nevus    multiple sites  . Hx of squamous cell carcinoma of skin 11/01/2009  L medial mid calf  . Hyperlipidemia   . Hypothyroidism   . Insomnia   . Left hip pain   . Protein calorie malnutrition ([place])    Past Surgical History:  Procedure Laterality Date  . CATARACT EXTRACTION W/PHACO Left 12/31/2019   Procedure: CATARACT EXTRACTION PHACO AND INTRAOCULAR LENS PLACEMENT (IOC) LEFT DIABETIC 10.20  01:17.6  13.1%;  Surgeon: Leandrew Koyanagi, MD;  Location:  [place];  Service: Ophthalmology;  Laterality: Left;  Diabetic - oral meds  . COLONOSCOPY WITH PROPOFOL N/A 12/31/2017   Procedure: COLONOSCOPY WITH PROPOFOL;  Surgeon: Manya Silvas, MD;  Location: [facility] ENDOSCOPY;  Service: Endoscopy;  Laterality: N/A;  . HERNIA REPAIR    . TOTAL ABDOMINAL HYSTERECTOMY  2009   Family History  Problem Relation Age of Onset  . Diabetes Mellitus II Mother   . Stroke Father   . Stroke Maternal Grandmother   . Heart disease Paternal Grandfather   . Breast cancer Sister 58   Social History   Socioeconomic History  . Marital status: Widowed    Spouse name: Not on file  . Number of children: Not on file  . Years of education: Not on file  . Highest education level: High school graduate  Occupational History  . Not on file  Tobacco Use  . Smoking status: Never Smoker  . Smokeless tobacco: Never Used  Vaping Use  . Vaping Use: Never used  Substance and Sexual Activity  . Alcohol use: No    Alcohol/week: 0.0 standard drinks  . Drug use: No  . Sexual activity: Not on file  Other Topics Concern  . Not on file  Social History Narrative  . Not on file   Social Determinants of Health   Financial Resource Strain: Low Risk   . Difficulty of Paying Living Expenses: Not hard at all  Food Insecurity: No Food Insecurity  . Worried About Charity fundraiser in the Last Year: Never true  . Ran Out of Food in the Last Year: Never true  Transportation Needs: No Transportation Needs  . Lack of Transportation (Medical): No  . Lack of Transportation (Non-Medical): No  Physical Activity: Inactive  . Days of Exercise per Week: 0 days  . Minutes of Exercise per Session: 0 min  Stress: No Stress Concern Present  . Feeling of Stress : Not at all  Social Connections:   . Frequency of Communication with Friends and Family: Not on file  . Frequency of Social Gatherings with Friends and Family: Not on file  . Attends Religious Services: Not on file    . Active Member of Clubs or Organizations: Not on file  . Attends Archivist Meetings: Not on file  . Marital Status: Not on file    Tobacco Counseling Counseling given: Not Answered   Clinical Intake:  Pre-visit preparation completed: Yes  Pain : No/denies pain     Nutritional Status: BMI <19  Underweight Nutritional Risks: None Diabetes: Yes  How often do you need to have someone help you when you read instructions, pamphlets, or other written materials from your doctor or pharmacy?: 1 - Never What is the last grade level you completed in school?: 12th grade  Diabetic? Yes Nutrition Risk Assessment:  Has the patient had any N/V/D within the last 2 months?  No  Does the patient have any non-healing wounds?  No  Has the patient had any unintentional weight loss or weight gain?  Yes   Diabetes:  Is  the patient diabetic?  Yes  If diabetic, was a CBG obtained today?  No  Did the patient bring in their glucometer from home?  No  How often do you monitor your CBG's? daily.   Financial Strains and Diabetes Management:  Are you having any financial strains with the device, your supplies or your medication? No .  Does the patient want to be seen by Chronic Care Management for management of their diabetes?  No  Would the patient like to be referred to a Nutritionist or for Diabetic Management?  No   Diabetic Exams:  Diabetic Eye Exam: Completed 11/11/2019 Diabetic Foot Exam: Completed 03/17/2019   Interpreter Needed?: No  Information entered by :: NAllen LPN   Activities of Daily Living In your present state of health, do you have any difficulty performing the following activities: 01/06/2020 12/31/2019  Hearing? N N  Vision? N N  Difficulty concentrating or making decisions? N N  Walking or climbing stairs? N N  Dressing or bathing? N N  Doing errands, shopping? N -  Preparing Food and eating ? N -  Using the Toilet? N -  In the past six months, have  you accidently leaked urine? Y -  Comment sometimes at night -  Do you have problems with loss of bowel control? N -  Managing your Medications? N -  Managing your Finances? N -  Housekeeping or managing your Housekeeping? N -  Some recent data might be hidden    Patient Care Team: Olin Hauser, DO as PCP - General (Family Medicine)  Indicate any recent Medical Services you may have received from other than [facility] providers in the past year (date may be approximate).     Assessment:   This is a routine wellness examination for Barbara Santos.  Hearing/Vision screen  Hearing Screening   125Hz  250Hz  500Hz  1000Hz  2000Hz  3000Hz  4000Hz  6000Hz  8000Hz   Right ear:           Left ear:           Vision Screening Comments: Regular eye exams, [facility]  Dietary issues and exercise activities discussed: Current Exercise Habits: The patient does not participate in regular exercise at present  Goals    . Gain weight     Little weight gain by next year    . Gain weight     Some weight gain     . Gain weight    . Patient Stated     01/06/2020, no goals      Depression Screen PHQ 2/9 Scores 01/06/2020 09/15/2019 11/19/2018 09/19/2018 03/20/2018 12/18/2017 11/13/2017  PHQ - 2 Score 0 0 0 0 0 0 0  PHQ- 9 Score - - - - - - -    Fall Risk Fall Risk  01/06/2020 09/15/2019 03/17/2019 11/19/2018 09/19/2018  Falls in the past year? 1 0 0 1 0  Comment tripped over a rock - - - -  Number falls in past yr: 0 0 - 0 -  Injury with Fall? 0 0 - 0 -  Risk for fall due to : Impaired balance/gait;History of fall(s);Medication side effect - - - -  Follow up Falls evaluation completed;Education provided;Falls prevention discussed Falls evaluation completed - - Falls evaluation completed    Any stairs in or around the home? Yes  If so, are there any without handrails? No  Home free of loose throw rugs in walkways, pet beds, electrical cords, etc? Yes  Adequate lighting in your home to  reduce  risk of falls? Yes   ASSISTIVE DEVICES UTILIZED TO PREVENT FALLS:  Life alert? Yes  Use of a cane, walker or w/c? No  Grab bars in the bathroom? Yes  Shower chair or bench in shower? Yes  Elevated toilet seat or a handicapped toilet? Yes   TIMED UP AND GO:  Was the test performed? No . .    Cognitive Function: MMSE - Mini Mental State Exam 11/06/2014  Orientation to time 5  Orientation to Place 5  Registration 3  Attention/ Calculation 5  Recall 3  Language- name 2 objects 2  Language- repeat 1  Language- follow 3 step command 3  Language- read & follow direction 1  Write a sentence 1  Copy design 1  Total score 30     6CIT Screen 01/06/2020 11/19/2018 11/13/2017 10/24/2016  What Year? 0 points 0 points 0 points 0 points  What month? 0 points 0 points 0 points 0 points  What time? 0 points 0 points 0 points 0 points  Count back from 20 0 points 0 points 0 points 0 points  Months in reverse 2 points 0 points 0 points 0 points  Repeat phrase 0 points 0 points 0 points 0 points  Total Score 2 0 0 0    Immunizations Immunization History  Administered Date(s) Administered  . Influenza, High Dose Seasonal PF 01/05/2015, 02/24/2016, 01/16/2017, 01/14/2018, 01/06/2019  . Influenza-Unspecified 01/28/2014, 01/14/2018  . PFIZER SARS-COV-2 Vaccination 05/14/2019, 06/07/2019  . Pneumococcal Conjugate-13 12/03/2013  . Pneumococcal Polysaccharide-23 10/24/2016  . Tdap 12/03/2013  . Zoster 04/24/2010  . Zoster Recombinat (Shingrix) 12/04/2018, 02/17/2019, 03/06/2019    TDAP status: Up to date Flu Vaccine status: Up to date Pneumococcal vaccine status: Up to date Covid-19 vaccine status: Completed vaccines  Qualifies for Shingles Vaccine? Yes   Zostavax completed Yes   Shingrix Completed?: Yes  Screening Tests Health Maintenance  Topic Date Due  . INFLUENZA VACCINE  11/23/2019  . URINE MICROALBUMIN  01/15/2020  . FOOT EXAM  03/16/2020  . HEMOGLOBIN A1C  03/17/2020  .  OPHTHALMOLOGY EXAM  11/10/2020  . TETANUS/TDAP  12/04/2023  . DEXA SCAN  Completed  . COVID-19 Vaccine  Completed  . PNA vac Low Risk Adult  Completed    Health Maintenance  Health Maintenance Due  Topic Date Due  . INFLUENZA VACCINE  11/23/2019  . URINE MICROALBUMIN  01/15/2020    Colorectal cancer screening: No longer required.  Mammogram status: No longer required.  Bone Density status: Completed 04/24/2012.   Lung Cancer Screening: (Low Dose CT Chest recommended if Age 73-80 years, 30 pack-year currently smoking OR have quit w/in 15years.) does not qualify.   Lung Cancer Screening Referral: no  Additional Screening:  Hepatitis C Screening: does not qualify;  Vision Screening: Recommended annual ophthalmology exams for early detection of glaucoma and other disorders of the eye. Is the patient up to date with their annual eye exam?  Yes  Who is the provider or what is the name of the office in which the patient attends annual eye exams? [facility] If pt is not established with a provider, would they like to be referred to a provider to establish care? No .   Dental Screening: Recommended annual dental exams for proper oral hygiene  Community Resource Referral / Chronic Care Management: CRR required this visit?  No   CCM required this visit?  No      Plan:     I have personally  reviewed and noted the following in the patient's chart:   . Medical and social history . Use of alcohol, tobacco or illicit drugs  . Current medications and supplements . Functional ability and status . Nutritional status . Physical activity . Advanced directives . List of other physicians . Hospitalizations, surgeries, and ER visits in previous 12 months . Vitals . Screenings to include cognitive, depression, and falls . Referrals and appointments  In addition, I have reviewed and discussed with patient certain preventive protocols, quality metrics, and best practice  recommendations. A written personalized care plan for preventive services as well as general preventive health recommendations were provided to patient.     Kellie Simmering, LPN   [id number]   Nurse Notes:

## 2020-01-06 NOTE — Patient Instructions (Signed)
Barbara Santos , Thank you for taking time to come for your Medicare Wellness Visit. I appreciate your ongoing commitment to your health goals. Please review the following plan we discussed and let me know if I can assist you in the future.   Screening recommendations/referrals: Colonoscopy: not required Mammogram: completed 2021 per patient Bone Density: completed 04/24/2012 Recommended yearly ophthalmology/optometry visit for glaucoma screening and checkup Recommended yearly dental visit for hygiene and checkup  Vaccinations: Influenza vaccine: due Pneumococcal vaccine: completed 10/24/2013 Tdap vaccine: completed 12/03/2013 Shingles vaccine: completed   Covid-19: 06/07/2019, 05/14/2019  Advanced directives: Please bring a copy of your POA (Power of Attorney) and/or Living Will to your next appointment.   Conditions/risks identified: none  Next appointment: Follow up in one year for your annual wellness visit    Preventive Care 65 Years and Older, Female Preventive care refers to lifestyle choices and visits with your health care provider that can promote health and wellness. What does preventive care include?  A yearly physical exam. This is also called an annual well check.  Dental exams once or twice a year.  Routine eye exams. Ask your health care provider how often you should have your eyes checked.  Personal lifestyle choices, including:  Daily care of your teeth and gums.  Regular physical activity.  Eating a healthy diet.  Avoiding tobacco and drug use.  Limiting alcohol use.  Practicing safe sex.  Taking low-dose aspirin every day.  Taking vitamin and mineral supplements as recommended by your health care provider. What happens during an annual well check? The services and screenings done by your health care provider during your annual well check will depend on your age, overall health, lifestyle risk factors, and family history of disease. Counseling  Your  health care provider may ask you questions about your:  Alcohol use.  Tobacco use.  Drug use.  Emotional well-being.  Home and relationship well-being.  Sexual activity.  Eating habits.  History of falls.  Memory and ability to understand (cognition).  Work and work Statistician.  Reproductive health. Screening  You may have the following tests or measurements:  Height, weight, and BMI.  Blood pressure.  Lipid and cholesterol levels. These may be checked every 5 years, or more frequently if you are over 55 years old.  Skin check.  Lung cancer screening. You may have this screening every year starting at age 88 if you have a 30-pack-year history of smoking and currently smoke or have quit within the past 15 years.  Fecal occult blood test (FOBT) of the stool. You may have this test every year starting at age 92.  Flexible sigmoidoscopy or colonoscopy. You may have a sigmoidoscopy every 5 years or a colonoscopy every 10 years starting at age 6.  Hepatitis C blood test.  Hepatitis B blood test.  Sexually transmitted disease (STD) testing.  Diabetes screening. This is done by checking your blood sugar (glucose) after you have not eaten for a while (fasting). You may have this done every 1-3 years.  Bone density scan. This is done to screen for osteoporosis. You may have this done starting at age 10.  Mammogram. This may be done every 1-2 years. Talk to your health care provider about how often you should have regular mammograms. Talk with your health care provider about your test results, treatment options, and if necessary, the need for more tests. Vaccines  Your health care provider may recommend certain vaccines, such as:  Influenza vaccine. This is recommended  every year.  Tetanus, diphtheria, and acellular pertussis (Tdap, Td) vaccine. You may need a Td booster every 10 years.  Zoster vaccine. You may need this after age 33.  Pneumococcal 13-valent  conjugate (PCV13) vaccine. One dose is recommended after age 73.  Pneumococcal polysaccharide (PPSV23) vaccine. One dose is recommended after age 70. Talk to your health care provider about which screenings and vaccines you need and how often you need them. This information is not intended to replace advice given to you by your health care provider. Make sure you discuss any questions you have with your health care provider. Document Released: 05/07/2015 Document Revised: 12/29/2015 Document Reviewed: 02/09/2015 Elsevier Interactive Patient Education  2017 [place] Prevention in the Home Falls can cause injuries. They can happen to people of all ages. There are many things you can do to make your home safe and to help prevent falls. What can I do on the outside of my home?  Regularly fix the edges of walkways and driveways and fix any cracks.  Remove anything that might make you trip as you walk through a door, such as a raised step or threshold.  Trim any bushes or trees on the path to your home.  Use bright outdoor lighting.  Clear any walking paths of anything that might make someone trip, such as rocks or tools.  Regularly check to see if handrails are loose or broken. Make sure that both sides of any steps have handrails.  Any raised decks and porches should have guardrails on the edges.  Have any leaves, snow, or ice cleared regularly.  Use sand or salt on walking paths during winter.  Clean up any spills in your garage right away. This includes oil or grease spills. What can I do in the bathroom?  Use night lights.  Install grab bars by the toilet and in the tub and shower. Do not use towel bars as grab bars.  Use non-skid mats or decals in the tub or shower.  If you need to sit down in the shower, use a plastic, non-slip stool.  Keep the floor dry. Clean up any water that spills on the floor as soon as it happens.  Remove soap buildup in the tub or  shower regularly.  Attach bath mats securely with double-sided non-slip rug tape.  Do not have throw rugs and other things on the floor that can make you trip. What can I do in the bedroom?  Use night lights.  Make sure that you have a light by your bed that is easy to reach.  Do not use any sheets or blankets that are too big for your bed. They should not hang down onto the floor.  Have a firm chair that has side arms. You can use this for support while you get dressed.  Do not have throw rugs and other things on the floor that can make you trip. What can I do in the kitchen?  Clean up any spills right away.  Avoid walking on wet floors.  Keep items that you use a lot in easy-to-reach places.  If you need to reach something above you, use a strong step stool that has a grab bar.  Keep electrical cords out of the way.  Do not use floor polish or wax that makes floors slippery. If you must use wax, use non-skid floor wax.  Do not have throw rugs and other things on the floor that can make you trip. What  can I do with my stairs?  Do not leave any items on the stairs.  Make sure that there are handrails on both sides of the stairs and use them. Fix handrails that are broken or loose. Make sure that handrails are as long as the stairways.  Check any carpeting to make sure that it is firmly attached to the stairs. Fix any carpet that is loose or worn.  Avoid having throw rugs at the top or bottom of the stairs. If you do have throw rugs, attach them to the floor with carpet tape.  Make sure that you have a light switch at the top of the stairs and the bottom of the stairs. If you do not have them, ask someone to add them for you. What else can I do to help prevent falls?  Wear shoes that:  Do not have high heels.  Have rubber bottoms.  Are comfortable and fit you well.  Are closed at the toe. Do not wear sandals.  If you use a stepladder:  Make sure that it is fully  opened. Do not climb a closed stepladder.  Make sure that both sides of the stepladder are locked into place.  Ask someone to hold it for you, if possible.  Clearly mark and make sure that you can see:  Any grab bars or handrails.  First and last steps.  Where the edge of each step is.  Use tools that help you move around (mobility aids) if they are needed. These include:  Canes.  Walkers.  Scooters.  Crutches.  Turn on the lights when you go into a dark area. Replace any light bulbs as soon as they burn out.  Set up your furniture so you have a clear path. Avoid moving your furniture around.  If any of your floors are uneven, fix them.  If there are any pets around you, be aware of where they are.  Review your medicines with your doctor. Some medicines can make you feel dizzy. This can increase your chance of falling. Ask your doctor what other things that you can do to help prevent falls. This information is not intended to replace advice given to you by your health care provider. Make sure you discuss any questions you have with your health care provider. Document Released: 02/04/2009 Document Revised: 09/16/2015 Document Reviewed: 05/15/2014 Elsevier Interactive Patient Education  2017 [employer].

## 2020-01-07 ENCOUNTER — Encounter: Payer: Self-pay | Admitting: Family Medicine

## 2020-01-07 ENCOUNTER — Other Ambulatory Visit: Payer: Self-pay

## 2020-01-07 ENCOUNTER — Ambulatory Visit (INDEPENDENT_AMBULATORY_CARE_PROVIDER_SITE_OTHER): Payer: Medicare HMO | Admitting: Family Medicine

## 2020-01-07 VITALS — BP 149/62 | HR 66 | Temp 97.5°F | Resp 16 | Ht 61.0 in | Wt 92.8 lb

## 2020-01-07 DIAGNOSIS — E440 Moderate protein-calorie malnutrition: Secondary | ICD-10-CM | POA: Diagnosis not present

## 2020-01-07 DIAGNOSIS — E1169 Type 2 diabetes mellitus with other specified complication: Secondary | ICD-10-CM

## 2020-01-07 DIAGNOSIS — N3001 Acute cystitis with hematuria: Secondary | ICD-10-CM

## 2020-01-07 DIAGNOSIS — Z23 Encounter for immunization: Secondary | ICD-10-CM | POA: Diagnosis not present

## 2020-01-07 DIAGNOSIS — N390 Urinary tract infection, site not specified: Secondary | ICD-10-CM | POA: Diagnosis not present

## 2020-01-07 DIAGNOSIS — B379 Candidiasis, unspecified: Secondary | ICD-10-CM | POA: Diagnosis not present

## 2020-01-07 DIAGNOSIS — T3695XA Adverse effect of unspecified systemic antibiotic, initial encounter: Secondary | ICD-10-CM

## 2020-01-07 DIAGNOSIS — R319 Hematuria, unspecified: Secondary | ICD-10-CM | POA: Diagnosis not present

## 2020-01-07 LAB — POCT URINALYSIS DIPSTICK
Bilirubin, UA: NEGATIVE
Glucose, UA: NEGATIVE
Ketones, UA: NEGATIVE
Nitrite, UA: NEGATIVE
Protein, UA: POSITIVE — AB
Spec Grav, UA: 1.01 (ref 1.010–1.025)
Urobilinogen, UA: 0.2 E.U./dL
pH, UA: 5 (ref 5.0–8.0)

## 2020-01-07 LAB — POCT UA - MICROALBUMIN: Microalbumin Ur, POC: 0 mg/L

## 2020-01-07 NOTE — Patient Instructions (Addendum)
Thank you for coming to the office today.  Urine test today, culture, - if it shows the bacteria for a UTI, then we can treat with antibiotic and we can add yeast medication as well.  If the urine test result comes back NEGATIVE, no sign of infection, then I would recommend a consultation with GYN provider - may be vaginal tissue/hormonal cause, triggering burning.  We can consider a low dose hormonal cream here if you want to try otherwise I would recommend the consultation.  Please schedule a Follow-up Appointment to: Return if symptoms worsen or fail to improve, for keep apt as scheduled.  If you have any other questions or concerns, please feel free to call the office or send a message through [place]. You may also schedule an earlier appointment if necessary.  Additionally, you may be receiving a survey about your experience at our office within a few days to 1 week by e-mail or mail. We value your feedback.  Nobie Putnam, DO [place]

## 2020-01-07 NOTE — Progress Notes (Signed)
Subjective:    Patient ID: Barbara Santos, female    DOB: 1935/05/20, 84 y.o.   MRN: [id number]  Barbara Santos is a 84 y.o. female presenting on 01/07/2020 for Urinary Tract Infection (onset couple of month but getting worst from past days--only Sxs is burning)   HPI   Dysuria / UTI vs Vaginitis Last visit 08/2019, had dysuria and possible vaginal yeast symptoms, treated Diflucan, had incomplete improvement. Now has had persistent or chronic for few month dysuria, most days, not every day. Uses topical vagisil PRN with some relief Drinking cran/apple juice, some yogurt She has some vaginal dryness, not using any topicals or hormonal, not seen GYN Denies hematuria, nausea vomiting dizziness headache near syncope, diarrhea abdominal pain vaginal bleeding  Weight Low, BMI >17 Poor weight gain Appetite is good, increasing glucerna - but still not gaining weight Now up 1 lb in 1 week  Type 2 DM She is due for urine microalbumin Next apt 02/2020, she is worried about sugars, she is very cautious with diet, and has had A1c in 6 range.  Health Maintenance: Due for Flu Shot, will receive today    Depression screen [facility] 2/9 01/06/2020 09/15/2019 11/19/2018  Decreased Interest 0 0 0  Down, Depressed, Hopeless 0 0 0  PHQ - 2 Score 0 0 0  Altered sleeping - - -  Tired, decreased energy - - -  Change in appetite - - -  Feeling bad or failure about yourself  - - -  Trouble concentrating - - -  Moving slowly or fidgety/restless - - -  Suicidal thoughts - - -  PHQ-9 Score - - -  Difficult doing work/chores - - -    Social History   Tobacco Use  . Smoking status: Never Smoker  . Smokeless tobacco: Never Used  Vaping Use  . Vaping Use: Never used  Substance Use Topics  . Alcohol use: No    Alcohol/week: 0.0 standard drinks  . Drug use: No    Review of Systems Per HPI unless specifically indicated above     Objective:    BP (!) 149/62   Pulse 66   Temp (!) 97.5 F  (36.4 C) (Temporal)   Resp 16   Ht 5\' 1"  (1.549 m)   Wt 92 lb 12.8 oz (42.1 kg)   SpO2 100%   BMI 17.53 kg/m   Wt Readings from Last 3 Encounters:  01/07/20 92 lb 12.8 oz (42.1 kg)  01/06/20 95 lb (43.1 kg)  12/31/19 91 lb (41.3 kg)    Physical Exam Vitals and nursing note reviewed.  Constitutional:      General: She is not in acute distress.    Appearance: She is well-developed. She is not diaphoretic.     Comments: Well-appearing, comfortable, cooperative  HENT:     Head: Normocephalic and atraumatic.  Eyes:     General:        Right eye: No discharge.        Left eye: No discharge.     Conjunctiva/sclera: Conjunctivae normal.  Cardiovascular:     Rate and Rhythm: Normal rate.  Pulmonary:     Effort: Pulmonary effort is normal.  Skin:    General: Skin is warm and dry.     Findings: No erythema or rash.  Neurological:     Mental Status: She is alert and oriented to person, place, and time.  Psychiatric:        Behavior: Behavior normal.  Comments: Well groomed, good eye contact, normal speech and thoughts        Results for orders placed or performed in visit on 01/07/20  POCT Urinalysis Dipstick  Result Value Ref Range   Color, UA dark amber    Clarity, UA clear    Glucose, UA Negative Negative   Bilirubin, UA Negative    Ketones, UA Negative    Spec Grav, UA 1.010 1.010 - 1.025   Blood, UA trace    pH, UA 5.0 5.0 - 8.0   Protein, UA Positive (A) Negative   Urobilinogen, UA 0.2 0.2 or 1.0 E.U./dL   Nitrite, UA Negative    Leukocytes, UA Trace (A) Negative   Appearance     Odor        Assessment & Plan:   Problem List Items Addressed This Visit    Type 2 diabetes mellitus with other specified complication ([place])   Relevant Orders   POCT UA - Microalbumin   Protein calorie malnutrition ([place])    Other Visit Diagnoses    Acute cystitis with hematuria    -  Primary   Relevant Orders   POCT Urinalysis Dipstick (Completed)   Urine Culture    Needs flu shot       Relevant Orders   Flu Vaccine QUAD High Dose(Fluad)      Clinically with dysuria, suspected UTI Urine Dipstick in office shows trace leuks, trace blood, positive protein  Hold antibiotic or anti yeast therapy for now, since chronic symptoms >3+ months  Will check urine culture today, await results first, and treat based on result. If diagnose UTI will send antibiotic (note PCN/Amox allergy) and add Diflucan (for antibiotic induced yeast), if no UTI can still offer Diflucan, and would offer topical estrogen conjugated cream for atrophic vaginitis OR can refer to GYN for consultation  Check urine microalbumin  #Protein calorie malnutrition / weight Seems weight stabilized Encourage lifestyle diet improvement, protein rich foods Follow-up as planned.  No orders of the defined types were placed in this encounter.    Follow up plan: Return if symptoms worsen or fail to improve, for keep apt as scheduled.   Nobie Putnam, [facility] 01/07/2020, 9:54 AM

## 2020-01-08 DIAGNOSIS — E119 Type 2 diabetes mellitus without complications: Secondary | ICD-10-CM | POA: Diagnosis not present

## 2020-01-08 DIAGNOSIS — H2511 Age-related nuclear cataract, right eye: Secondary | ICD-10-CM | POA: Diagnosis not present

## 2020-01-08 LAB — URINE CULTURE
MICRO NUMBER:: 10955033
SPECIMEN QUALITY:: ADEQUATE

## 2020-01-09 MED ORDER — FLUCONAZOLE 150 MG PO TABS: ORAL_TABLET | ORAL | 0 refills | Status: DC

## 2020-01-09 MED ORDER — NITROFURANTOIN MONOHYD MACRO 100 MG PO CAPS: 100.0000 mg | ORAL_CAPSULE | Freq: Two times a day (BID) | ORAL | 0 refills | Status: DC

## 2020-01-09 NOTE — Addendum Note (Signed)
Addended by: Olin Hauser on: 01/09/2020 01:08 PM   Modules accepted: Orders

## 2020-01-15 ENCOUNTER — Encounter: Payer: Self-pay | Admitting: Ophthalmology

## 2020-01-16 ENCOUNTER — Other Ambulatory Visit: Payer: Self-pay | Admitting: Family Medicine

## 2020-01-16 DIAGNOSIS — E119 Type 2 diabetes mellitus without complications: Secondary | ICD-10-CM

## 2020-01-16 DIAGNOSIS — T3695XA Adverse effect of unspecified systemic antibiotic, initial encounter: Secondary | ICD-10-CM

## 2020-01-16 DIAGNOSIS — N3001 Acute cystitis with hematuria: Secondary | ICD-10-CM

## 2020-01-16 NOTE — Telephone Encounter (Signed)
Requested Prescriptions  Pending Prescriptions Disp Refills  . ONETOUCH VERIO test strip [employer] Med Name: [place] TEST STRIP] 100 strip 1    Sig: CHECK BLOOD SUGAR UP TO ONCE DAILY     Endocrinology: Diabetes - Testing Supplies Passed - 01/16/2020  2:41 PM      Passed - Valid encounter within last 12 months    Recent Outpatient Visits          1 week ago Acute cystitis with hematuria   [place], DO   4 months ago Type 2 diabetes mellitus with other specified complication, without long-term current use of insulin [facility])   [facility], Devonne Doughty, DO   10 months ago Annual physical exam   [facility] Olin Hauser, DO   1 year ago Type 2 diabetes mellitus with other specified complication, without long-term current use of insulin [facility])   [facility] Olin Hauser, DO   1 year ago Annual physical exam   [place], DO      Future Appointments            In 2 months [place], Devonne Doughty, [facility], [facility]

## 2020-01-16 NOTE — Telephone Encounter (Signed)
Copied from [place] [id number]. Topic: Quick Communication - Rx Refill/Question >> Jan 16, 2020  9:25 AM Leward Quan A wrote: Medication: nitrofurantoin, macrocrystal-monohydrate, (MACROBID) 100 MG capsule, fluconazole (DIFLUCAN) 150 MG tablet   Has the patient contacted their pharmacy? No. (Agent: If no, request that the patient contact the pharmacy for the refill.) (Agent: If yes, when and what did the pharmacy advise?)  Preferred Pharmacy (with phone number or street name): [id number] - [place], [address]  Phone:  [phone] Fax:  [phone]     Agent: Please be advised that RX refills may take up to 3 business days. We ask that you follow-up with your pharmacy.

## 2020-01-16 NOTE — Telephone Encounter (Signed)
Requested medication (s) are due for refill today: fluconazole & nitrofurantoin  Requested medication (s) are on the active medication list:  yes  Last refill: 01/09/20  Future visit scheduled: yes  Notes to clinic:  no assigned protocol    Requested Prescriptions  Pending Prescriptions Disp Refills   fluconazole (DIFLUCAN) 150 MG tablet 2 tablet 0    Sig: After finish antibiotic. Take one tablet by mouth on Day 1. Repeat dose 2nd tablet on Day 3.      Off-Protocol Failed - 01/16/2020 10:07 AM      Failed - Medication not assigned to a protocol, review manually.      Passed - Valid encounter within last 12 months    Recent Outpatient Visits           1 week ago Acute cystitis with hematuria   [place], DO   4 months ago Type 2 diabetes mellitus with other specified complication, without long-term current use of insulin [facility])   [facility], Devonne Doughty, DO   10 months ago Annual physical exam   [facility] O[facility] Hauser, DO   1 year ago Type 2 diabetes mellitus with other specified complication, without long-term current use of insulin [facility])   [place], Devonne Doughty, DO   1 year ago Annual physical exam   [place], DO       Future Appointments             In 2 months [facility], Devonne Doughty, DO [facility]              nitrofurantoin, macrocrystal-monohydrate, (MACROBID) 100 MG capsule 14 capsule 0    Sig: Take 1 capsule (100 mg total) by mouth 2 (two) times daily. For 7 days      Off-Protocol Failed - 01/16/2020 10:07 AM      Failed - Medication not assigned to a protocol, review manually.      Passed - Valid encounter within last 12 months    Recent Outpatient Visits           1 week ago Acute cystitis with hematuria   [place], DO   4 months ago Type 2 diabetes mellitus with other specified complication, without long-term current use of insulin [facility])   [place], DO   10 months ago Annual physical exam   [facility] O[facility] Hauser, DO   1 year ago Type 2 diabetes mellitus with other specified complication, without long-term current use of insulin [facility])   [facility] Olin Hauser, DO   1 year ago Annual physical exam   [place], DO       Future Appointments             In 2 months [facility], Devonne Doughty, [facility], [facility]

## 2020-01-18 DIAGNOSIS — R69 Illness, unspecified: Secondary | ICD-10-CM | POA: Diagnosis not present

## 2020-01-22 DIAGNOSIS — N949 Unspecified condition associated with female genital organs and menstrual cycle: Secondary | ICD-10-CM | POA: Diagnosis not present

## 2020-01-22 DIAGNOSIS — L439 Lichen planus, unspecified: Secondary | ICD-10-CM | POA: Diagnosis not present

## 2020-01-26 ENCOUNTER — Other Ambulatory Visit: Payer: Self-pay

## 2020-01-26 ENCOUNTER — Other Ambulatory Visit
Admission: RE | Admit: 2020-01-26 | Discharge: 2020-01-26 | Disposition: A | Discharge status: Home / Self Care | Payer: Medicare HMO | Source: Ambulatory Visit | Attending: Ophthalmology | Admitting: Ophthalmology

## 2020-01-26 DIAGNOSIS — Z01812 Encounter for preprocedural laboratory examination: Secondary | ICD-10-CM | POA: Diagnosis present

## 2020-01-26 DIAGNOSIS — Z20822 Contact with and (suspected) exposure to covid-19: Secondary | ICD-10-CM | POA: Insufficient documentation

## 2020-01-26 LAB — SARS CORONAVIRUS 2 (TAT 6-24 HRS): SARS Coronavirus 2: NEGATIVE

## 2020-01-26 NOTE — Discharge Instructions (Signed)

## 2020-01-27 NOTE — Anesthesia Preprocedure Evaluation (Addendum)
Anesthesia Evaluation  Patient identified by MRN, date of birth, ID band Patient awake    Reviewed: Allergy & Precautions, NPO status , Patient's Chart, lab work & pertinent test results  History of Anesthesia Complications (+) PONV and history of anesthetic complications (nausea after last cataract)  Airway Mallampati: I   Neck ROM: Full    Dental no notable dental hx.    Pulmonary neg pulmonary ROS,    Pulmonary exam normal breath sounds clear to auscultation       Cardiovascular Exercise Tolerance: Good negative cardio ROS Normal cardiovascular exam Rhythm:Regular Rate:Normal     Neuro/Psych negative neurological ROS     GI/Hepatic negative GI ROS,   Endo/Other  diabetes, Type 2Hypothyroidism   Renal/GU negative Renal ROS     Musculoskeletal  (+) Arthritis ,   Abdominal   Peds  Hematology negative hematology ROS (+)   Anesthesia Other Findings   Reproductive/Obstetrics                           Anesthesia Physical Anesthesia Plan  ASA: II  Anesthesia Plan: MAC   Post-op Pain Management:    Induction: Intravenous  PONV Risk Score and Plan: 2 and TIVA, Midazolam and Treatment may vary due to age or medical condition  Airway Management Planned: Nasal Cannula  Additional Equipment:   Intra-op Plan:   Post-operative Plan:   Informed Consent: I have reviewed the patients History and Physical, chart, labs and discussed the procedure including the risks, benefits and alternatives for the proposed anesthesia with the patient or authorized representative who has indicated his/her understanding and acceptance.       Plan Discussed with: CRNA  Anesthesia Plan Comments:        Anesthesia Quick Evaluation

## 2020-01-28 ENCOUNTER — Ambulatory Visit: Payer: Medicare HMO | Admitting: Anesthesiology

## 2020-01-28 ENCOUNTER — Other Ambulatory Visit: Payer: Self-pay

## 2020-01-28 ENCOUNTER — Encounter: Payer: Self-pay | Admitting: Ophthalmology

## 2020-01-28 ENCOUNTER — Encounter
Admission: RE | Disposition: A | Discharge status: Home / Self Care | Payer: Self-pay | Source: Home / Self Care | Attending: Ophthalmology

## 2020-01-28 ENCOUNTER — Ambulatory Visit
Admission: RE | Admit: 2020-01-28 | Discharge: 2020-01-28 | Disposition: A | Discharge status: Home / Self Care | Payer: Medicare HMO | Attending: Ophthalmology | Admitting: Ophthalmology

## 2020-01-28 DIAGNOSIS — E039 Hypothyroidism, unspecified: Secondary | ICD-10-CM | POA: Insufficient documentation

## 2020-01-28 DIAGNOSIS — M1990 Unspecified osteoarthritis, unspecified site: Secondary | ICD-10-CM | POA: Diagnosis not present

## 2020-01-28 DIAGNOSIS — H42 Glaucoma in diseases classified elsewhere: Secondary | ICD-10-CM | POA: Insufficient documentation

## 2020-01-28 DIAGNOSIS — Z803 Family history of malignant neoplasm of breast: Secondary | ICD-10-CM | POA: Insufficient documentation

## 2020-01-28 DIAGNOSIS — Z86018 Personal history of other benign neoplasm: Secondary | ICD-10-CM | POA: Diagnosis not present

## 2020-01-28 DIAGNOSIS — Z880 Allergy status to penicillin: Secondary | ICD-10-CM | POA: Diagnosis not present

## 2020-01-28 DIAGNOSIS — Z90710 Acquired absence of both cervix and uterus: Secondary | ICD-10-CM | POA: Insufficient documentation

## 2020-01-28 DIAGNOSIS — H2511 Age-related nuclear cataract, right eye: Secondary | ICD-10-CM | POA: Insufficient documentation

## 2020-01-28 DIAGNOSIS — E1136 Type 2 diabetes mellitus with diabetic cataract: Secondary | ICD-10-CM | POA: Diagnosis not present

## 2020-01-28 DIAGNOSIS — Z823 Family history of stroke: Secondary | ICD-10-CM | POA: Insufficient documentation

## 2020-01-28 DIAGNOSIS — E1139 Type 2 diabetes mellitus with other diabetic ophthalmic complication: Secondary | ICD-10-CM | POA: Diagnosis not present

## 2020-01-28 DIAGNOSIS — Z9842 Cataract extraction status, left eye: Secondary | ICD-10-CM | POA: Diagnosis not present

## 2020-01-28 DIAGNOSIS — E7800 Pure hypercholesterolemia, unspecified: Secondary | ICD-10-CM | POA: Diagnosis not present

## 2020-01-28 DIAGNOSIS — H25811 Combined forms of age-related cataract, right eye: Secondary | ICD-10-CM | POA: Diagnosis not present

## 2020-01-28 DIAGNOSIS — Z8249 Family history of ischemic heart disease and other diseases of the circulatory system: Secondary | ICD-10-CM | POA: Insufficient documentation

## 2020-01-28 DIAGNOSIS — Z85828 Personal history of other malignant neoplasm of skin: Secondary | ICD-10-CM | POA: Diagnosis not present

## 2020-01-28 DIAGNOSIS — E785 Hyperlipidemia, unspecified: Secondary | ICD-10-CM | POA: Diagnosis not present

## 2020-01-28 DIAGNOSIS — Z833 Family history of diabetes mellitus: Secondary | ICD-10-CM | POA: Insufficient documentation

## 2020-01-28 HISTORY — PX: CATARACT EXTRACTION W/PHACO: SHX586

## 2020-01-28 LAB — GLUCOSE, CAPILLARY
Glucose-Capillary: 149 mg/dL — ABNORMAL HIGH (ref 70–99)
Glucose-Capillary: 136 mg/dL — ABNORMAL HIGH (ref 70–99)

## 2020-01-28 SURGERY — PHACOEMULSIFICATION, CATARACT, WITH IOL INSERTION
Anesthesia: Monitor Anesthesia Care | Site: Eye | Laterality: Right

## 2020-01-28 MED ORDER — ACETAMINOPHEN 325 MG PO TABS: 650.0000 mg | ORAL_TABLET | Freq: Once | ORAL | Status: DC | PRN

## 2020-01-28 MED ORDER — EPINEPHRINE PF 1 MG/ML IJ SOLN
INTRAOCULAR | Status: DC | PRN
  Administered 2020-01-28: 71 mL via OPHTHALMIC

## 2020-01-28 MED ORDER — BRIMONIDINE TARTRATE-TIMOLOL 0.2-0.5 % OP SOLN
OPHTHALMIC | Status: DC | PRN
  Administered 2020-01-28: 1 [drp] via OPHTHALMIC

## 2020-01-28 MED ORDER — MOXIFLOXACIN HCL 0.5 % OP SOLN
OPHTHALMIC | Status: DC | PRN
  Administered 2020-01-28: 0.2 mL via OPHTHALMIC

## 2020-01-28 MED ORDER — LIDOCAINE HCL (PF) 2 % IJ SOLN
INTRAOCULAR | Status: DC | PRN
  Administered 2020-01-28: 1 mL

## 2020-01-28 MED ORDER — LACTATED RINGERS IV SOLN: INTRAVENOUS | Status: DC

## 2020-01-28 MED ORDER — TETRACAINE HCL 0.5 % OP SOLN
1.0000 [drp] | OPHTHALMIC | Status: DC | PRN
  Administered 2020-01-28 (×3): 1 [drp] via OPHTHALMIC

## 2020-01-28 MED ORDER — ACETAMINOPHEN 160 MG/5ML PO SOLN: 325.0000 mg | ORAL | Status: DC | PRN

## 2020-01-28 MED ORDER — NA HYALUR & NA CHOND-NA HYALUR 0.4-0.35 ML IO KIT
PACK | INTRAOCULAR | Status: DC | PRN
  Administered 2020-01-28: 1 mL via INTRAOCULAR

## 2020-01-28 MED ORDER — ONDANSETRON HCL 4 MG/2ML IJ SOLN
4.0000 mg | Freq: Once | INTRAMUSCULAR | Status: AC | PRN
  Administered 2020-01-28: 4 mg via INTRAVENOUS

## 2020-01-28 MED ORDER — ARMC OPHTHALMIC DILATING DROPS
1.0000 "application " | OPHTHALMIC | Status: DC | PRN
  Administered 2020-01-28 (×3): 1 via OPHTHALMIC

## 2020-01-28 MED ORDER — MOXIFLOXACIN HCL 0.5 % OP SOLN
1.0000 [drp] | OPHTHALMIC | Status: DC | PRN
  Administered 2020-01-28 (×3): 1 [drp] via OPHTHALMIC

## 2020-01-28 MED ORDER — MIDAZOLAM HCL 2 MG/2ML IJ SOLN
INTRAMUSCULAR | Status: DC | PRN
  Administered 2020-01-28: 1.5 mg via INTRAVENOUS

## 2020-01-28 SURGICAL SUPPLY — 23 items
CANNULA ANT/CHMB 27G (MISCELLANEOUS) ×1 IMPLANT
CANNULA ANT/CHMB 27GA (MISCELLANEOUS) ×3 IMPLANT
GLOVE SURG LX 7.5 STRW (GLOVE) ×2
GLOVE SURG LX STRL 7.5 STRW (GLOVE) ×1 IMPLANT
GLOVE SURG TRIUMPH 8.0 PF LTX (GLOVE) ×3 IMPLANT
GOWN STRL REUS W/ TWL LRG LVL3 (GOWN DISPOSABLE) ×2 IMPLANT
GOWN STRL REUS W/TWL LRG LVL3 (GOWN DISPOSABLE) ×6
LENS IOL DIOP 25.0 (Intraocular Lens) ×3 IMPLANT
LENS IOL TECNIS MONO 25.0 (Intraocular Lens) IMPLANT
MARKER SKIN DUAL TIP RULER LAB (MISCELLANEOUS) ×3 IMPLANT
NDL CAPSULORHEX 25GA (NEEDLE) ×1 IMPLANT
NDL FILTER BLUNT 18X1 1/2 (NEEDLE) ×2 IMPLANT
NEEDLE CAPSULORHEX 25GA (NEEDLE) ×3 IMPLANT
NEEDLE FILTER BLUNT 18X 1/2SAF (NEEDLE) ×4
NEEDLE FILTER BLUNT 18X1 1/2 (NEEDLE) ×2 IMPLANT
PACK CATARACT BRASINGTON (MISCELLANEOUS) ×3 IMPLANT
PACK EYE AFTER SURG (MISCELLANEOUS) ×3 IMPLANT
PACK OPTHALMIC (MISCELLANEOUS) ×3 IMPLANT
SOLUTION OPHTHALMIC SALT (MISCELLANEOUS) ×3 IMPLANT
SYR 3ML LL SCALE MARK (SYRINGE) ×6 IMPLANT
SYR TB 1ML LUER SLIP (SYRINGE) ×3 IMPLANT
WATER STERILE IRR 250ML POUR (IV SOLUTION) ×3 IMPLANT
WIPE NON LINTING 3.25X3.25 (MISCELLANEOUS) ×3 IMPLANT

## 2020-01-28 NOTE — Op Note (Signed)
LOCATION:  [place]   PREOPERATIVE DIAGNOSIS:    Nuclear sclerotic cataract right eye. H25.11   POSTOPERATIVE DIAGNOSIS:  Nuclear sclerotic cataract right eye.     PROCEDURE:  Phacoemusification with posterior chamber intraocular lens placement of the right eye   ULTRASOUND TIME: Procedure(s): CATARACT EXTRACTION PHACO AND INTRAOCULAR LENS PLACEMENT (IOC) RIGHT DIABETIC 13.67 01:34.5 14.5% (Right)  LENS:   Implant Name Type Inv. Item Serial No. Manufacturer Lot No. LRB No. Used Action  LENS IOL DIOP 25.0 - [id number] Intraocular Lens LENS IOL DIOP 25.0 [id number]   Right 1 Implanted         SURGEON:  Wyonia Hough, MD   ANESTHESIA:  Topical with tetracaine drops and 2% Xylocaine jelly, augmented with 1% preservative-free intracameral lidocaine.    COMPLICATIONS:  None.   DESCRIPTION OF PROCEDURE:  The patient was identified in the holding room and transported to the operating room and placed in the supine position under the operating microscope.  The right eye was identified as the operative eye and it was prepped and draped in the usual sterile ophthalmic fashion.   A 1 millimeter clear-corneal paracentesis was made at the 12:00 position.  0.5 ml of preservative-free 1% lidocaine was injected into the anterior chamber. The anterior chamber was filled with Viscoat viscoelastic.  A 2.4 millimeter keratome was used to make a near-clear corneal incision at the 9:00 position.  A curvilinear capsulorrhexis was made with a cystotome and capsulorrhexis forceps.  Balanced salt solution was used to hydrodissect and hydrodelineate the nucleus.   Phacoemulsification was then used in stop and chop fashion to remove the lens nucleus and epinucleus.  The remaining cortex was then removed using the irrigation and aspiration handpiece. Provisc was then placed into the capsular bag to distend it for lens placement.  A lens was then injected into the capsular bag.  The  remaining viscoelastic was aspirated.   Wounds were hydrated with balanced salt solution.  The anterior chamber was inflated to a physiologic pressure with balanced salt solution.  No wound leaks were noted. Vigamox 0.2 ml of a 1mg  per ml solution was injected into the anterior chamber for a dose of 0.2 mg of intracameral antibiotic at the completion of the case.   Timolol and Brimonidine drops were applied to the eye.  The patient was taken to the recovery room in stable condition without complications of anesthesia or surgery.   Lasharon Dunivan 01/28/2020, 8:06 AM

## 2020-01-28 NOTE — H&P (Signed)
[facility]   Primary Care Physician:  Olin Hauser, DO Ophthalmologist: Dr. Leandrew Koyanagi  Pre-Procedure History & Physical: HPI:  Barbara Santos is a 84 y.o. female here for ophthalmic surgery.   Past Medical History:  Diagnosis Date  . Arthritis   . Diabetes mellitus, type 2 ([place])   . Glaucoma   . Hx of basal cell carcinoma    R cheek, R dorsal lat. distal forearm  . Hx of dysplastic nevus    multiple sites  . Hx of squamous cell carcinoma of skin 11/01/2009   L medial mid calf  . Hyperlipidemia   . Hypothyroidism   . Insomnia   . Left hip pain   . Protein calorie malnutrition ([place])     Past Surgical History:  Procedure Laterality Date  . CATARACT EXTRACTION W/PHACO Left 12/31/2019   Procedure: CATARACT EXTRACTION PHACO AND INTRAOCULAR LENS PLACEMENT (IOC) LEFT DIABETIC 10.20  01:17.6  13.1%;  Surgeon: Leandrew Koyanagi, MD;  Location: [place];  Service: Ophthalmology;  Laterality: Left;  Diabetic - oral meds  . COLONOSCOPY WITH PROPOFOL N/A 12/31/2017   Procedure: COLONOSCOPY WITH PROPOFOL;  Surgeon: Manya Silvas, MD;  Location: [facility] ENDOSCOPY;  Service: Endoscopy;  Laterality: N/A;  . HERNIA REPAIR    . TOTAL ABDOMINAL HYSTERECTOMY  2009    Prior to Admission medications   Medication Sig Start Date End Date Taking? Authorizing Provider  Cholecalciferol (VITAMIN D) 2000 UNITS tablet Take 2,000 Units by mouth daily.   Yes [provider]  diphenhydrAMINE (BENADRYL) 25 MG tablet Take 25 mg by mouth every 6 (six) hours as needed.   Yes [provider]  dorzolamide-timolol (COSOPT) 22.3-6.8 MG/ML ophthalmic solution Place 1 drop into both eyes 2 (two) times daily.  08/09/15  Yes [provider]  fluconazole (DIFLUCAN) 150 MG tablet After finish antibiotic. Take one tablet by mouth on Day 1. Repeat dose 2nd tablet on Day 3. 01/09/20  Yes Karamalegos, Devonne Doughty, DO  lactose free nutrition (BOOST) LIQD  Take 237 mLs by mouth 3 (three) times daily between meals.   Yes [provider]  levothyroxine (SYNTHROID) 50 MCG tablet Take 1 tablet (50 mcg total) by mouth daily before breakfast. 05/29/19  Yes Karamalegos, Devonne Doughty, DO  metFORMIN (GLUCOPHAGE-XR) 500 MG 24 hr tablet Take 1 tablet (500 mg total) by mouth daily with breakfast. 05/29/19  Yes Karamalegos, Devonne Doughty, DO  Multiple Vitamin (MULTIVITAMIN) tablet Take 1 tablet by mouth daily.   Yes [provider]  nitrofurantoin, macrocrystal-monohydrate, (MACROBID) 100 MG capsule Take 1 capsule (100 mg total) by mouth 2 (two) times daily. For 7 days 01/09/20  Yes Karamalegos, Devonne Doughty, DO  ONETOUCH DELICA LANCETS FINE MISC 1 each by Other route 3 (three) times daily. 09/17/15  Yes [provider]  ONETOUCH VERIO test strip CHECK BLOOD SUGAR UP TO ONCE DAILY 01/16/20  Yes Karamalegos, Devonne Doughty, DO  rosuvastatin (CRESTOR) 5 MG tablet Take 1 tablet (5 mg total) by mouth at bedtime. 09/15/19  Yes Olin Hauser, DO    Allergies as of 01/02/2020 - Review Complete 12/31/2019  Allergen Reaction Noted  . Amoxicillin Rash 09/10/2018  . Penicillins Rash 11/06/2014    Family History  Problem Relation Age of Onset  . Diabetes Mellitus II Mother   . Stroke Father   . Stroke Maternal Grandmother   . Heart disease Paternal Grandfather   . Breast cancer Sister 33    Social History   Socioeconomic  History  . Marital status: Widowed    Spouse name: Not on file  . Number of children: Not on file  . Years of education: Not on file  . Highest education level: High school graduate  Occupational History  . Not on file  Tobacco Use  . Smoking status: Never Smoker  . Smokeless tobacco: Never Used  Vaping Use  . Vaping Use: Never used  Substance and Sexual Activity  . Alcohol use: No    Alcohol/week: 0.0 standard drinks  . Drug use: No  . Sexual activity: Not on file  Other Topics Concern  . Not on file   Social History Narrative  . Not on file   Social Determinants of Health   Financial Resource Strain: Low Risk   . Difficulty of Paying Living Expenses: Not hard at all  Food Insecurity: No Food Insecurity  . Worried About Charity fundraiser in the Last Year: Never true  . Ran Out of Food in the Last Year: Never true  Transportation Needs: No Transportation Needs  . Lack of Transportation (Medical): No  . Lack of Transportation (Non-Medical): No  Physical Activity: Inactive  . Days of Exercise per Week: 0 days  . Minutes of Exercise per Session: 0 min  Stress: No Stress Concern Present  . Feeling of Stress : Not at all  Social Connections:   . Frequency of Communication with Friends and Family: Not on file  . Frequency of Social Gatherings with Friends and Family: Not on file  . Attends Religious Services: Not on file  . Active Member of Clubs or Organizations: Not on file  . Attends Archivist Meetings: Not on file  . Marital Status: Not on file  Intimate Partner Violence:   . Fear of Current or Ex-Partner: Not on file  . Emotionally Abused: Not on file  . Physically Abused: Not on file  . Sexually Abused: Not on file    Review of Systems: See HPI, otherwise negative ROS  Physical Exam: BP (!) 148/71   Pulse 62   Temp (!) 97.5 F (36.4 C) (Temporal)   Resp 16   Ht 5\' 1"  (1.549 m)   Wt 41.7 kg   SpO2 100%   BMI 17.38 kg/m  General:   Alert,  pleasant and cooperative in NAD Head:  Normocephalic and atraumatic. Lungs:  Clear to auscultation.    Heart:  Regular rate and rhythm.   Impression/Plan: Barbara Santos is here for ophthalmic surgery.  Risks, benefits, limitations, and alternatives regarding ophthalmic surgery have been reviewed with the patient.  Questions have been answered.  All parties agreeable.   Leandrew Koyanagi, MD  01/28/2020, 7:33 AM

## 2020-01-28 NOTE — Anesthesia Procedure Notes (Signed)
Procedure Name: MAC Performed by: Kila Godina, CRNA Pre-anesthesia Checklist: Patient identified, Emergency Drugs available, Suction available, Timeout performed and Patient being monitored Patient Re-evaluated:Patient Re-evaluated prior to induction Oxygen Delivery Method: Nasal cannula Placement Confirmation: positive ETCO2       

## 2020-01-28 NOTE — Transfer of Care (Signed)
Immediate Anesthesia Transfer of Care Note  Patient: Barbara Santos  Procedure(s) Performed: CATARACT EXTRACTION PHACO AND INTRAOCULAR LENS PLACEMENT (IOC) RIGHT DIABETIC 13.67 01:34.5 14.5% (Right Eye)  Patient Location: PACU  Anesthesia Type: MAC  Level of Consciousness: awake, alert  and patient cooperative  Airway and Oxygen Therapy: Patient Spontanous Breathing and Patient connected to supplemental oxygen  Post-op Assessment: Post-op Vital signs reviewed, Patient's Cardiovascular Status Stable, Respiratory Function Stable, Patent Airway and No signs of Nausea or vomiting  Post-op Vital Signs: Reviewed and stable  Complications: No complications documented.

## 2020-01-28 NOTE — Anesthesia Postprocedure Evaluation (Signed)
Anesthesia Post Note  Patient: Barbara Santos  Procedure(s) Performed: CATARACT EXTRACTION PHACO AND INTRAOCULAR LENS PLACEMENT (IOC) RIGHT DIABETIC 13.67 01:34.5 14.5% (Right Eye)     Patient location during evaluation: PACU Anesthesia Type: MAC Level of consciousness: awake and alert, oriented and patient cooperative Pain management: pain level controlled Vital Signs Assessment: post-procedure vital signs reviewed and stable Respiratory status: spontaneous breathing, nonlabored ventilation and respiratory function stable Cardiovascular status: blood pressure returned to baseline and stable Postop Assessment: adequate PO intake Anesthetic complications: no   No complications documented.  Darrin Nipper

## 2020-02-18 DIAGNOSIS — N952 Postmenopausal atrophic vaginitis: Secondary | ICD-10-CM | POA: Diagnosis not present

## 2020-02-18 DIAGNOSIS — L439 Lichen planus, unspecified: Secondary | ICD-10-CM | POA: Diagnosis not present

## 2020-02-18 DIAGNOSIS — N9489 Other specified conditions associated with female genital organs and menstrual cycle: Secondary | ICD-10-CM | POA: Diagnosis not present

## 2020-02-18 DIAGNOSIS — L292 Pruritus vulvae: Secondary | ICD-10-CM | POA: Diagnosis not present

## 2020-02-18 DIAGNOSIS — N9089 Other specified noninflammatory disorders of vulva and perineum: Secondary | ICD-10-CM | POA: Diagnosis not present

## 2020-03-04 DIAGNOSIS — H401122 Primary open-angle glaucoma, left eye, moderate stage: Secondary | ICD-10-CM | POA: Diagnosis not present

## 2020-03-09 ENCOUNTER — Other Ambulatory Visit: Payer: Self-pay | Admitting: *Deleted

## 2020-03-09 DIAGNOSIS — Z0000 Encounter for general adult medical examination without abnormal findings: Secondary | ICD-10-CM

## 2020-03-09 DIAGNOSIS — E1169 Type 2 diabetes mellitus with other specified complication: Secondary | ICD-10-CM

## 2020-03-09 DIAGNOSIS — E039 Hypothyroidism, unspecified: Secondary | ICD-10-CM

## 2020-03-09 DIAGNOSIS — E440 Moderate protein-calorie malnutrition: Secondary | ICD-10-CM

## 2020-03-09 DIAGNOSIS — E7800 Pure hypercholesterolemia, unspecified: Secondary | ICD-10-CM

## 2020-03-10 ENCOUNTER — Other Ambulatory Visit: Payer: Self-pay

## 2020-03-10 ENCOUNTER — Other Ambulatory Visit: Payer: Medicare HMO

## 2020-03-10 DIAGNOSIS — E039 Hypothyroidism, unspecified: Secondary | ICD-10-CM | POA: Diagnosis not present

## 2020-03-10 DIAGNOSIS — E440 Moderate protein-calorie malnutrition: Secondary | ICD-10-CM | POA: Diagnosis not present

## 2020-03-10 DIAGNOSIS — E7800 Pure hypercholesterolemia, unspecified: Secondary | ICD-10-CM | POA: Diagnosis not present

## 2020-03-10 DIAGNOSIS — Z0000 Encounter for general adult medical examination without abnormal findings: Secondary | ICD-10-CM | POA: Diagnosis not present

## 2020-03-10 DIAGNOSIS — E1169 Type 2 diabetes mellitus with other specified complication: Secondary | ICD-10-CM | POA: Diagnosis not present

## 2020-03-11 LAB — COMPLETE METABOLIC PANEL WITH GFR
AG Ratio: 1.7 (calc) (ref 1.0–2.5)
ALT: 15 U/L (ref 6–29)
AST: 20 U/L (ref 10–35)
Albumin: 4.2 g/dL (ref 3.6–5.1)
Alkaline phosphatase (APISO): 87 U/L (ref 37–153)
BUN: 13 mg/dL (ref 7–25)
CO2: 27 mmol/L (ref 20–32)
Calcium: 10 mg/dL (ref 8.6–10.4)
Chloride: 99 mmol/L (ref 98–110)
Creat: 0.63 mg/dL (ref 0.60–0.88)
GFR, Est African American: 97 mL/min/{1.73_m2} (ref >=60)
GFR, Est Non African American: 84 mL/min/{1.73_m2} (ref >=60)
Globulin: 2.5 g/dL (calc) (ref 1.9–3.7)
Glucose, Bld: 145 mg/dL — ABNORMAL HIGH (ref 65–99)
Potassium: 4.5 mmol/L (ref 3.5–5.3)
Sodium: 134 mmol/L — ABNORMAL LOW (ref 135–146)
Total Bilirubin: 0.5 mg/dL (ref 0.2–1.2)
Total Protein: 6.7 g/dL (ref 6.1–8.1)

## 2020-03-11 LAB — HEMOGLOBIN A1C
Hgb A1c MFr Bld: 7.2 % of total Hgb — ABNORMAL HIGH (ref <5.7)
Mean Plasma Glucose: 160 (calc)
eAG (mmol/L): 8.9 (calc)

## 2020-03-11 LAB — TSH: TSH: 3.89 mIU/L (ref 0.40–4.50)

## 2020-03-11 LAB — CBC WITH DIFFERENTIAL/PLATELET
Absolute Monocytes: 493 cells/uL (ref 200–950)
Basophils Absolute: 48 cells/uL (ref 0–200)
Basophils Relative: 0.9 %
Eosinophils Absolute: 80 cells/uL (ref 15–500)
Eosinophils Relative: 1.5 %
HCT: 40.9 % (ref 35.0–45.0)
Hemoglobin: 13.6 g/dL (ref 11.7–15.5)
Lymphs Abs: 2109 cells/uL (ref 850–3900)
MCH: 31.6 pg (ref 27.0–33.0)
MCHC: 33.3 g/dL (ref 32.0–36.0)
MCV: 94.9 fL (ref 80.0–100.0)
MPV: 10.2 fL (ref 7.5–12.5)
Monocytes Relative: 9.3 %
Neutro Abs: 2571 cells/uL (ref 1500–7800)
Neutrophils Relative %: 48.5 %
Platelets: 294 10*3/uL (ref 140–400)
RBC: 4.31 10*6/uL (ref 3.80–5.10)
RDW: 11.7 % (ref 11.0–15.0)
Total Lymphocyte: 39.8 %
WBC: 5.3 10*3/uL (ref 3.8–10.8)

## 2020-03-11 LAB — LIPID PANEL
Cholesterol: 133 mg/dL (ref <200)
HDL: 62 mg/dL (ref >=50)
LDL Cholesterol (Calc): 57 mg/dL (calc)
Non-HDL Cholesterol (Calc): 71 mg/dL (calc) (ref <130)
Total CHOL/HDL Ratio: 2.1 (calc) (ref <5.0)
Triglycerides: 68 mg/dL (ref <150)

## 2020-03-11 LAB — T4, FREE: Free T4: 1.1 ng/dL (ref 0.8–1.8)

## 2020-03-15 ENCOUNTER — Encounter: Payer: Self-pay | Admitting: Dermatology

## 2020-03-15 ENCOUNTER — Other Ambulatory Visit: Payer: Self-pay

## 2020-03-15 ENCOUNTER — Ambulatory Visit: Payer: Medicare HMO | Admitting: Dermatology

## 2020-03-15 DIAGNOSIS — L820 Inflamed seborrheic keratosis: Secondary | ICD-10-CM

## 2020-03-15 DIAGNOSIS — Z86018 Personal history of other benign neoplasm: Secondary | ICD-10-CM

## 2020-03-15 DIAGNOSIS — L814 Other melanin hyperpigmentation: Secondary | ICD-10-CM

## 2020-03-15 DIAGNOSIS — L821 Other seborrheic keratosis: Secondary | ICD-10-CM

## 2020-03-15 DIAGNOSIS — D229 Melanocytic nevi, unspecified: Secondary | ICD-10-CM

## 2020-03-15 DIAGNOSIS — Z85828 Personal history of other malignant neoplasm of skin: Secondary | ICD-10-CM

## 2020-03-15 DIAGNOSIS — D1800 Hemangioma unspecified site: Secondary | ICD-10-CM

## 2020-03-15 DIAGNOSIS — L570 Actinic keratosis: Secondary | ICD-10-CM | POA: Diagnosis not present

## 2020-03-15 DIAGNOSIS — L578 Other skin changes due to chronic exposure to nonionizing radiation: Secondary | ICD-10-CM

## 2020-03-15 DIAGNOSIS — Z1283 Encounter for screening for malignant neoplasm of skin: Secondary | ICD-10-CM

## 2020-03-15 DIAGNOSIS — R634 Abnormal weight loss: Secondary | ICD-10-CM

## 2020-03-15 DIAGNOSIS — Z86007 Personal history of in-situ neoplasm of skin: Secondary | ICD-10-CM | POA: Diagnosis not present

## 2020-03-15 NOTE — Progress Notes (Signed)
Follow-Up Visit   Subjective  Barbara Santos is a 84 y.o. female who presents for the following: Annual Exam (year TBSE, Hx of skin cancer, Hx of Dysplastic nevus ). The patient presents for Total-Body Skin Exam (TBSE) for skin cancer screening and mole check.  The following portions of the chart were reviewed this encounter and updated as appropriate:  Tobacco  Allergies  Meds  Problems  Med Hx  Surg Hx  Fam Hx     Review of Systems:  No other skin or systemic complaints except as noted in HPI or Assessment and Plan.  Objective  Well appearing patient in no apparent distress; mood and affect are within normal limits.  A full examination was performed including scalp, head, eyes, ears, nose, lips, neck, chest, axillae, abdomen, back, buttocks, bilateral upper extremities, bilateral lower extremities, hands, feet, fingers, toes, fingernails, and toenails. All findings within normal limits unless otherwise noted below.  Objective  R forearm x 1: Erythematous keratotic or waxy stuck-on papule or plaque.   Objective  Nose, face (5): Erythematous thin papules/macules with gritty scale.   Objective  multiple see history: Scar with no evidence of recurrence.   Objective  Right Breast: Well healed scar with no evidence of recurrence.   Objective  Left medial calf: Well healed scar with no evidence of recurrence.   Objective  body: Weight loss   Assessment & Plan  Inflamed seborrheic keratosis R forearm x 1  Destruction of lesion - R forearm x 1 Complexity: simple   Destruction method: cryotherapy   Informed consent: discussed and consent obtained   Timeout:  patient name, date of birth, surgical site, and procedure verified Lesion destroyed using liquid nitrogen: Yes   Region frozen until ice ball extended beyond lesion: Yes   Outcome: patient tolerated procedure well with no complications   Post-procedure details: wound care instructions given    AK  (actinic keratosis) (5) Nose, face  Destruction of lesion - Nose, face Complexity: simple   Destruction method: cryotherapy   Informed consent: discussed and consent obtained   Timeout:  patient name, date of birth, surgical site, and procedure verified Lesion destroyed using liquid nitrogen: Yes   Region frozen until ice ball extended beyond lesion: Yes   Outcome: patient tolerated procedure well with no complications   Post-procedure details: wound care instructions given    History of dysplastic nevus multiple see history  Clear. Observe for recurrence. Call clinic for new or changing lesions.  Recommend regular skin exams, daily broad-spectrum spf 30+ sunscreen use, and photoprotection.     History of basal cell carcinoma (BCC) Right Breast  Clear. Observe for recurrence. Call clinic for new or changing lesions.  Recommend regular skin exams, daily broad-spectrum spf 30+ sunscreen use, and photoprotection.     History of squamous cell carcinoma in situ (SCCIS) of skin Left medial calf  Clear. Observe for recurrence. Call clinic for new or changing lesions.  Recommend regular skin exams, daily broad-spectrum spf 30+ sunscreen use, and photoprotection.     Weight loss body See PCP for weight loss cause evaluation.   Lentigines - Scattered tan macules - Discussed due to sun exposure - Benign, observe - Call for any changes  Seborrheic Keratoses scalp - Stuck-on, waxy, tan-brown papules and plaques  - Discussed benign etiology and prognosis. - Observe - Call for any changes  Melanocytic Nevi - Tan-brown and/or pink-flesh-colored symmetric macules and papules - Benign appearing on exam today - Observation - Call  clinic for new or changing moles - Recommend daily use of broad spectrum spf 30+ sunscreen to sun-exposed areas.   Hemangiomas - Red papules - Discussed benign nature - Observe - Call for any changes  Actinic Damage - Chronic, secondary to cumulative  UV/sun exposure - diffuse scaly erythematous macules with underlying dyspigmentation - Recommend daily broad spectrum sunscreen SPF 30+ to sun-exposed areas, reapply every 2 hours as needed.  - Call for new or changing lesions.  Skin cancer screening performed today.  Return in about 1 year (around 03/15/2021) for TBSE.  IMarye Round, CMA, am acting as scribe for Sarina Ser, MD .  Documentation: I have reviewed the above documentation for accuracy and completeness, and I agree with the above.  Sarina Ser, MD

## 2020-03-17 ENCOUNTER — Encounter: Payer: Self-pay | Admitting: Family Medicine

## 2020-03-17 ENCOUNTER — Other Ambulatory Visit: Payer: Self-pay

## 2020-03-17 ENCOUNTER — Ambulatory Visit (INDEPENDENT_AMBULATORY_CARE_PROVIDER_SITE_OTHER): Payer: Medicare HMO | Admitting: Family Medicine

## 2020-03-17 VITALS — BP 135/58 | HR 63 | Temp 97.5°F | Resp 16 | Ht 61.0 in | Wt 92.8 lb

## 2020-03-17 DIAGNOSIS — E039 Hypothyroidism, unspecified: Secondary | ICD-10-CM

## 2020-03-17 DIAGNOSIS — E1169 Type 2 diabetes mellitus with other specified complication: Secondary | ICD-10-CM

## 2020-03-17 DIAGNOSIS — Z0000 Encounter for general adult medical examination without abnormal findings: Secondary | ICD-10-CM | POA: Diagnosis not present

## 2020-03-17 DIAGNOSIS — E7800 Pure hypercholesterolemia, unspecified: Secondary | ICD-10-CM | POA: Diagnosis not present

## 2020-03-17 DIAGNOSIS — R030 Elevated blood-pressure reading, without diagnosis of hypertension: Secondary | ICD-10-CM

## 2020-03-17 DIAGNOSIS — E440 Moderate protein-calorie malnutrition: Secondary | ICD-10-CM

## 2020-03-17 NOTE — Assessment & Plan Note (Signed)
Stable, controlled hypothyroidism Last TSH normal Continue Levothyroxine 42mcg daily

## 2020-03-17 NOTE — Assessment & Plan Note (Signed)
Normal Bp range Home readings reviewed

## 2020-03-17 NOTE — Progress Notes (Signed)
Subjective:    Patient ID: Barbara Santos, female    DOB: January 10, 1936, 84 y.o.   MRN: [id number]  Barbara Santos is a 84 y.o. female presenting on 03/17/2020 for Annual Exam   HPI   Here for Annual Physical and Lab Review.  CHRONIC DM, Type 2: BMI >17 moderate protein calorie malnutrition Admits some fluctuation of sugar - Todayreports doing well on medicine overall CBGs: Avg100 to 140s fasting AM, variety of other readings before and 2+ hours after meals, overall some 215 readings and below, most 150-170 detailed CBG log with her. 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  HYPERLIPIDEMIA: - Reports no concerns. Last lipid panel 02/2020, controlled  - Currently taking Rosuvastatin 5mg , tolerating well without side effects or myalgias  Additional updates from  [facility] - Lichen planus, initial salve has improved, burning improved. She gave topical hormone but now she has declined.  Constipation  Health Maintenance:  COVID booster updated 02/25/20 Flu UTD 12/2019 UTD PNA vaccine  Depression screen [facility] 2/9 03/17/2020 01/06/2020 09/15/2019  Decreased Interest 0 0 0  Down, Depressed, Hopeless 0 0 0  PHQ - 2 Score 0 0 0  Altered sleeping - - -  Tired, decreased energy - - -  Change in appetite - - -  Feeling bad or failure about  yourself  - - -  Trouble concentrating - - -  Moving slowly or fidgety/restless - - -  Suicidal thoughts - - -  PHQ-9 Score - - -  Difficult doing work/chores - - -    Past Medical History:  Diagnosis Date  . Arthritis   . Basal cell carcinoma 08/23/2009   Right dorsum lat. distal forearm.   . Diabetes mellitus, type 2 ([place])   . Dysplastic nevus 08/03/2008   Right ant. thigh, just above knee. Moderate to marked atypia, extends to base. Excised 09/23/2008, margins free.  Marland Kitchen Dysplastic nevus 08/23/2009   Right post. sup. thigh. MIld atypia, limited margins free.  . Glaucoma   . Hx of basal cell carcinoma    R cheek  . Hx of squamous cell carcinoma of skin 11/01/2009   L medial mid calf  . Hyperlipidemia   . Hypothyroidism   . Insomnia   . Left hip pain   . Protein calorie malnutrition ([place])   . Squamous cell carcinoma of skin 08/23/2009   Left medial mid calf. SCCis   Past Surgical History:  Procedure Laterality Date  . CATARACT EXTRACTION W/PHACO Left 12/31/2019   Procedure: CATARACT EXTRACTION PHACO AND INTRAOCULAR LENS PLACEMENT (IOC) LEFT DIABETIC 10.20  01:17.6  13.1%;  Surgeon: Leandrew Koyanagi, MD;  Location: [place];  Service: Ophthalmology;  Laterality: Left;  Diabetic - oral meds  . CATARACT EXTRACTION W/PHACO Right 01/28/2020   Procedure: CATARACT EXTRACTION PHACO AND INTRAOCULAR LENS PLACEMENT (IOC) RIGHT DIABETIC 13.67 01:34.5 14.5%;  Surgeon: Leandrew Koyanagi, MD;  Location: [place];  Service: Ophthalmology;  Laterality: Right;  .  COLONOSCOPY WITH PROPOFOL N/A 12/31/2017   Procedure: COLONOSCOPY WITH PROPOFOL;  Surgeon: Manya Silvas, MD;  Location: [facility] ENDOSCOPY;  Service: Endoscopy;  Laterality: N/A;  . HERNIA REPAIR    . TOTAL ABDOMINAL HYSTERECTOMY  2009   Social History   Socioeconomic History  . Marital status: Widowed    Spouse name: Not on file  . Number of children: Not on file  . Years of education: Not on file  .  Highest education level: High school graduate  Occupational History  . Not on file  Tobacco Use  . Smoking status: Never Smoker  . Smokeless tobacco: Never Used  Vaping Use  . Vaping Use: Never used  Substance and Sexual Activity  . Alcohol use: No    Alcohol/week: 0.0 standard drinks  . Drug use: No  . Sexual activity: Not on file  Other Topics Concern  . Not on file  Social History Narrative  . Not on file   Social Determinants of Health   Financial Resource Strain: Low Risk   . Difficulty of Paying Living Expenses: Not hard at all  Food Insecurity: No Food Insecurity  . Worried About Charity fundraiser in the Last Year: Never true  . Ran Out of Food in the Last Year: Never true  Transportation Needs: No Transportation Needs  . Lack of Transportation (Medical): No  . Lack of Transportation (Non-Medical): No  Physical Activity: Inactive  . Days of Exercise per Week: 0 days  . Minutes of Exercise per Session: 0 min  Stress: No Stress Concern Present  . Feeling of Stress : Not at all  Social Connections:   . Frequency of Communication with Friends and Family: Not on file  . Frequency of Social Gatherings with Friends and Family: Not on file  . Attends Religious Services: Not on file  . Active Member of Clubs or Organizations: Not on file  . Attends Archivist Meetings: Not on file  . Marital Status: Not on file  Intimate Partner Violence:   . Fear of Current or Ex-Partner: Not on file  . Emotionally Abused: Not on file  . Physically Abused: Not on file  . Sexually Abused: Not on file   Family History  Problem Relation Age of Onset  . Diabetes Mellitus II Mother   . Stroke Father   . Stroke Maternal Grandmother   . Heart disease Paternal Grandfather   . Breast cancer Sister 15   Current Outpatient Medications on File Prior to Visit  Medication Sig  . Cholecalciferol (VITAMIN D) 2000 UNITS tablet Take 2,000 Units by mouth daily.  . diphenhydrAMINE  (BENADRYL) 25 MG tablet Take 25 mg by mouth every 6 (six) hours as needed.  . dorzolamide-timolol (COSOPT) 22.3-6.8 MG/ML ophthalmic solution Place 1 drop into both eyes 2 (two) times daily.   Marland Kitchen lactose free nutrition (BOOST) LIQD Take 237 mLs by mouth 3 (three) times daily between meals.  Marland Kitchen levothyroxine (SYNTHROID) 50 MCG tablet Take 1 tablet (50 mcg total) by mouth daily before breakfast.  . metFORMIN (GLUCOPHAGE-XR) 500 MG 24 hr tablet Take 1 tablet (500 mg total) by mouth 2 (two) times daily with a meal.  . Multiple Vitamin (MULTIVITAMIN) tablet Take 1 tablet by mouth daily.  Glory Rosebush DELICA LANCETS FINE MISC 1 each by Other route 3 (three) times daily.  Glory Rosebush VERIO test strip CHECK BLOOD SUGAR UP TO ONCE DAILY  . rosuvastatin (CRESTOR) 5 MG tablet Take 1 tablet (5 mg total)  by mouth at bedtime.   No current facility-administered medications on file prior to visit.    Review of Systems  Constitutional: Negative for activity change, appetite change, chills, diaphoresis, fatigue and fever.  HENT: Negative for congestion and hearing loss.   Eyes: Negative for visual disturbance.  Respiratory: Negative for cough, chest tightness, shortness of breath and wheezing.   Cardiovascular: Negative for chest pain, palpitations and leg swelling.  Gastrointestinal: Negative for abdominal pain, anal bleeding, blood in stool, constipation, diarrhea, nausea and vomiting.  Endocrine: Negative for cold intolerance.  Genitourinary: Negative for dysuria, frequency and hematuria.  Musculoskeletal: Negative for arthralgias, back pain and neck pain.  Skin: Negative for rash.  Allergic/Immunologic: Negative for environmental allergies.  Neurological: Negative for dizziness, weakness, light-headedness, numbness and headaches.  Hematological: Negative for adenopathy.  Psychiatric/Behavioral: Negative for behavioral problems, dysphoric mood and sleep disturbance.   Per HPI unless specifically indicated  above     Objective:    BP (!) 135/58   Pulse 63   Temp (!) 97.5 F (36.4 C) (Temporal)   Resp 16   Ht 5\' 1"  (1.549 m)   Wt 92 lb 12.8 oz (42.1 kg)   SpO2 100%   BMI 17.53 kg/m   Wt Readings from Last 3 Encounters:  03/17/20 92 lb 12.8 oz (42.1 kg)  01/28/20 92 lb (41.7 kg)  01/07/20 92 lb 12.8 oz (42.1 kg)    Physical Exam Vitals and nursing note reviewed.  Constitutional:      General: She is not in acute distress.    Appearance: She is well-developed. She is not diaphoretic.     Comments: Well-appearing, comfortable, cooperative  HENT:     Head: Normocephalic and atraumatic.  Eyes:     General:        Right eye: No discharge.        Left eye: No discharge.     Conjunctiva/sclera: Conjunctivae normal.     Pupils: Pupils are equal, round, and reactive to light.  Neck:     Thyroid: No thyromegaly.     Vascular: No carotid bruit.  Cardiovascular:     Rate and Rhythm: Normal rate and regular rhythm.     Heart sounds: Normal heart sounds. No murmur heard.   Pulmonary:     Effort: Pulmonary effort is normal. No respiratory distress.     Breath sounds: Normal breath sounds. No wheezing or rales.  Abdominal:     General: Bowel sounds are normal. There is no distension.     Palpations: Abdomen is soft. There is no mass.     Tenderness: There is no abdominal tenderness.  Musculoskeletal:        General: No tenderness. Normal range of motion.     Cervical back: Normal range of motion and neck supple.     Right lower leg: No edema.     Left lower leg: No edema.     Comments: Upper / Lower Extremities: - Normal muscle tone, strength bilateral upper extremities 5/5, lower extremities 5/5  Lymphadenopathy:     Cervical: No cervical adenopathy.  Skin:    General: Skin is warm and dry.     Findings: No erythema or rash.  Neurological:     Mental Status: She is alert and oriented to person, place, and time.     Comments: Distal sensation intact to light touch all  extremities  Psychiatric:        Behavior: Behavior normal.     Comments: Well groomed,  good eye contact, normal speech and thoughts      Diabetic Foot Exam - Simple   Simple Foot Form Diabetic Foot exam was performed with the following findings: Yes 03/17/2020  9:43 AM  Visual Inspection See comments: Yes Sensation Testing Intact to touch and monofilament testing bilaterally: Yes Pulse Check Posterior Tibialis and Dorsalis pulse intact bilaterally: Yes Comments Bilateral great toe thickening discoloration. Mild dry skin. No ulceration. Intact monofilament.     Results for orders placed or performed in visit on 03/09/20  T4, free  Result Value Ref Range   Free T4 1.1 0.8 - 1.8 ng/dL  TSH  Result Value Ref Range   TSH 3.89 0.40 - 4.50 mIU/L  Lipid panel  Result Value Ref Range   Cholesterol 133 <200 mg/dL   HDL 62 > OR = 50 mg/dL   Triglycerides 68 <150 mg/dL   LDL Cholesterol (Calc) 57 mg/dL (calc)   Total CHOL/HDL Ratio 2.1 <5.0 (calc)   Non-HDL Cholesterol (Calc) 71 <130 mg/dL (calc)  COMPLETE METABOLIC PANEL WITH GFR  Result Value Ref Range   Glucose, Bld 145 (H) 65 - 99 mg/dL   BUN 13 7 - 25 mg/dL   Creat 0.63 0.60 - 0.88 mg/dL   GFR, Est Non African American 84 > OR = 60 mL/min/1.42m2   GFR, Est African American 97 > OR = 60 mL/min/1.67m2   BUN/Creatinine Ratio NOT APPLICABLE 6 - 22 (calc)   Sodium 134 (L) 135 - 146 mmol/L   Potassium 4.5 3.5 - 5.3 mmol/L   Chloride 99 98 - 110 mmol/L   CO2 27 20 - 32 mmol/L   Calcium 10.0 8.6 - 10.4 mg/dL   Total Protein 6.7 6.1 - 8.1 g/dL   Albumin 4.2 3.6 - 5.1 g/dL   Globulin 2.5 1.9 - 3.7 g/dL (calc)   AG Ratio 1.7 1.0 - 2.5 (calc)   Total Bilirubin 0.5 0.2 - 1.2 mg/dL   Alkaline phosphatase (APISO) 87 37 - 153 U/L   AST 20 10 - 35 U/L   ALT 15 6 - 29 U/L  CBC with Differential/Platelet  Result Value Ref Range   WBC 5.3 3.8 - 10.8 Thousand/uL   RBC 4.31 3.80 - 5.10 Million/uL   Hemoglobin 13.6 11.7 - 15.5 g/dL    HCT 40.9 35 - 45 %   MCV 94.9 80.0 - 100.0 fL   MCH 31.6 27.0 - 33.0 pg   MCHC 33.3 32.0 - 36.0 g/dL   RDW 11.7 11.0 - 15.0 %   Platelets 294 140 - 400 Thousand/uL   MPV 10.2 7.5 - 12.5 fL   Neutro Abs 2,571 1,500 - 7,800 cells/uL   Lymphs Abs 2,109 850 - 3,900 cells/uL   Absolute Monocytes 493 200 - 950 cells/uL   Eosinophils Absolute 80 15.0 - 500.0 cells/uL   Basophils Absolute 48 0.0 - 200.0 cells/uL   Neutrophils Relative % 48.5 %   Total Lymphocyte 39.8 %   Monocytes Relative 9.3 %   Eosinophils Relative 1.5 %   Basophils Relative 0.9 %  Hemoglobin A1c  Result Value Ref Range   Hgb A1c MFr Bld 7.2 (H) <5.7 % of total Hgb   Mean Plasma Glucose 160 (calc)   eAG (mmol/L) 8.9 (calc)      Assessment & Plan:   Problem List Items Addressed This Visit    Type 2 diabetes mellitus with other specified complication (HCC)    C1K up to 7.2, but overall reviewed goal 7-8 Complicated by  hypothyroidism and hyperlipidemia Last urine microalbumin negative 2021  Plan:  Double dose Metformin XR now to 500mg  BID - goal to control sugar to allow her to take in more calories/protein dietary intake, she is still restricting diet due to fear of high sugar Encourage improved lifestyle -and acceptable to allow A1c / CBGs to increase to 7+ range Check CBG, bring log to next visit for review Not on ACEi/ARB - negative urine microalbumin - previously Continue Statin DM Foot exam today      Relevant Medications   metFORMIN (GLUCOPHAGE-XR) 500 MG 24 hr tablet   Protein calorie malnutrition (HCC)    Weight stable now without loss in 3 months Improving diet but now slightly higher sugar, goal to keep increasing diet intake if she can double metformin for better sugar control      Hypothyroidism    Stable, controlled hypothyroidism Last TSH normal Continue Levothyroxine 20mcg daily      Elevated LDL cholesterol level    Controlled cholesterol on statin and lifestyle Calculated ASCVD 10 yr  risk score >30%  Plan: 1. Continue Rosuvastatin 5mg  nightly - Defer ASA - history of easy bleeding on trial years ago - Follow-up yearly lipids      Elevated BP without diagnosis of hypertension    Normal Bp range Home readings reviewed       Other Visit Diagnoses    Annual physical exam    -  Primary      Updated Health Maintenance information - UTD COVID vaccine and booster - UTD Flu shot Reviewed recent lab results with patient Encouraged improvement to lifestyle with diet and exercise Goal maintain weight, had issue with weight loss and poor weight gain, now seems stabilizing but goal to improve.  No orders of the defined types were placed in this encounter.     Follow up plan: Return in about 6 months (around 09/14/2020) for 6 month DM A1c, Low Weight.  Nobie Putnam, [facility] 03/17/2020, 9:27 AM

## 2020-03-17 NOTE — Assessment & Plan Note (Signed)
Weight stable now without loss in 3 months Improving diet but now slightly higher sugar, goal to keep increasing diet intake if she can double metformin for better sugar control

## 2020-03-17 NOTE — Assessment & Plan Note (Signed)
A1c up to 7.2, but overall reviewed goal 7-8 Complicated by hypothyroidism and hyperlipidemia Last urine microalbumin negative 2021  Plan:  Double dose Metformin XR now to 500mg  BID - goal to control sugar to allow her to take in more calories/protein dietary intake, she is still restricting diet due to fear of high sugar Encourage improved lifestyle -and acceptable to allow A1c / CBGs to increase to 7+ range Check CBG, bring log to next visit for review Not on ACEi/ARB - negative urine microalbumin - previously Continue Statin DM Foot exam today

## 2020-03-17 NOTE — Assessment & Plan Note (Signed)
Controlled cholesterol on statin and lifestyle Calculated ASCVD 10 yr risk score >30%  Plan: 1. Continue Rosuvastatin 5mg  nightly - Defer ASA - history of easy bleeding on trial years ago - Follow-up yearly lipids

## 2020-03-17 NOTE — Patient Instructions (Addendum)
Thank you for coming to the office today.  Increase Metformin to 1 pill twice a day for next few weeks to see if this can allow you to take in more calories in diet to gain some weight back.  Call us when you are ready to adjust dose metformin if you want to proceed to 2 pills a day, one twice a day.  Recent Labs    09/15/19 0933 03/10/20 0810  HGBA1C 6.9* 7.2*   Rest of blood work is excellent  Slightly higher A1c is okay  Cholesterol thyroid all controlled.  Kidney liver are normal.  Leg cramps - Try spoonful of yellow mustard to relieve leg cramps or try daily to prevent the problem  - OTC natural option is Hyland's Leg Cramps (Dissolving tablet) take as needed for muscle cramps   Please schedule a Follow-up Appointment to: Return in about 6 months (around 09/14/2020) for 6 month DM A1c, Low Weight.  If you have any other questions or concerns, please feel free to call the office or send a message through [place]. You may also schedule an earlier appointment if necessary.  Additionally, you may be receiving a survey about your experience at our office within a few days to 1 week by e-mail or mail. We value your feedback.  Nobie Putnam, DO [place]

## 2020-03-23 ENCOUNTER — Encounter: Payer: Self-pay | Admitting: Dermatology

## 2020-03-30 ENCOUNTER — Telehealth: Payer: Self-pay

## 2020-03-30 NOTE — Telephone Encounter (Signed)
Copied from [place] [id number]. Topic: General - Other >> Mar 29, 2020 12:13 PM Leward Quan A wrote:  Reason for CRM: Patient son / POA  Kayra Crowell called in to inform Dr Raliegh Ip that patient has fallen twice in the past few months. Have not been seriously hurt but he want to know if there is a test that can be done to check for early signs of dementia or whatever may be causing he to be off balance. Any questions please call Ph# [phone] or [phone]

## 2020-03-30 NOTE — Telephone Encounter (Signed)
Unable to reach at the number provided by patient's son reg patient--left message please triage with provider's recommendation.

## 2020-03-30 NOTE — Telephone Encounter (Signed)
Attempted to call son (POA) at number he provided, son not available, pt answered, unaware it was pt.  Stated I was returning call from son regarding call, gave general information regarding son's concerns, only mentioned son was " Concerned that she fell"   Pt states she "Stumbled and fell and was fine, bruise above right eye."

## 2020-03-30 NOTE — Telephone Encounter (Signed)
Please let her son POA know that there is not a simple test for balance or dementia, but we would need to learn more about the problems overall.  We can do a cognitive screening questionnaire when she comes in for next visit to ask about her memory and screen for dementia.  Otherwise - common causes of falls are:  1. Medications - can cause dizziness, I see Benadryl (Diphenhydramine) on her med list - she should not take this as it can definitely cause dizziness or falls.  2. Weakness of muscles - legs, back, hips, can cause patients to be off balance. Goal to improve strengthening with exercises  3. Poor Vision - Reduced vision can cause difficulty maintaining balance.  Nobie Putnam, [facility] 03/30/2020, 10:57 AM

## 2020-03-31 NOTE — Telephone Encounter (Signed)
As per patient she went to see her sister and she had done some new railing and steps which she was not aware and fell she only has bruise on Right side above eye but not hurting any where. She was asking for metformin Rx advised that it was already send on 03/17/2020.

## 2020-04-17 DIAGNOSIS — R69 Illness, unspecified: Secondary | ICD-10-CM | POA: Diagnosis not present

## 2020-04-19 ENCOUNTER — Other Ambulatory Visit: Payer: Self-pay | Admitting: Family Medicine

## 2020-04-19 DIAGNOSIS — E1169 Type 2 diabetes mellitus with other specified complication: Secondary | ICD-10-CM

## 2020-04-19 MED ORDER — METFORMIN HCL ER 500 MG PO TB24: 500.0000 mg | ORAL_TABLET | Freq: Two times a day (BID) | ORAL | 3 refills | Status: DC

## 2020-04-19 NOTE — Telephone Encounter (Signed)
Medication Refill - Medication: Metformin   Has the patient contacted their pharmacy? Yes.   Pt states that she is needing this refilled. She states that PCP changed this medication from 1x a day to 2x a day. Please advise.  (Agent: If no, request that the patient contact the pharmacy for the refill.) (Agent: If yes, when and what did the pharmacy advise?)  Preferred Pharmacy (with phone number or street name):  [facility] - [place], [place] - [address]  [address] [place] [address]  Phone: [phone] Fax: [phone]  Hours: Not open 24 hours     Agent: Please be advised that RX refills may take up to 3 business days. We ask that you follow-up with your pharmacy.

## 2020-04-22 DIAGNOSIS — N9089 Other specified noninflammatory disorders of vulva and perineum: Secondary | ICD-10-CM | POA: Diagnosis not present

## 2020-04-22 DIAGNOSIS — Z09 Encounter for follow-up examination after completed treatment for conditions other than malignant neoplasm: Secondary | ICD-10-CM | POA: Diagnosis not present

## 2020-05-03 ENCOUNTER — Other Ambulatory Visit: Payer: Self-pay

## 2020-05-03 DIAGNOSIS — E039 Hypothyroidism, unspecified: Secondary | ICD-10-CM

## 2020-05-03 MED ORDER — LEVOTHYROXINE SODIUM 50 MCG PO TABS: 50.0000 ug | ORAL_TABLET | Freq: Every day | ORAL | 3 refills | Status: DC

## 2020-05-03 NOTE — Addendum Note (Signed)
Addended by: Olin Hauser on: 05/03/2020 05:06 PM   Modules accepted: Orders

## 2020-05-03 NOTE — Addendum Note (Signed)
Addended by: Frederich Cha D on: 05/03/2020 03:51 PM   Modules accepted: Orders

## 2020-05-03 NOTE — Telephone Encounter (Signed)
Pt  Is requesting mail  Order of  levothyroxineto be called in

## 2020-07-13 DIAGNOSIS — Z7984 Long term (current) use of oral hypoglycemic drugs: Secondary | ICD-10-CM | POA: Diagnosis not present

## 2020-07-13 DIAGNOSIS — Z809 Family history of malignant neoplasm, unspecified: Secondary | ICD-10-CM | POA: Diagnosis not present

## 2020-07-13 DIAGNOSIS — H409 Unspecified glaucoma: Secondary | ICD-10-CM | POA: Diagnosis not present

## 2020-07-13 DIAGNOSIS — E785 Hyperlipidemia, unspecified: Secondary | ICD-10-CM | POA: Diagnosis not present

## 2020-07-13 DIAGNOSIS — E1165 Type 2 diabetes mellitus with hyperglycemia: Secondary | ICD-10-CM | POA: Diagnosis not present

## 2020-07-13 DIAGNOSIS — Z681 Body mass index (BMI) 19 or less, adult: Secondary | ICD-10-CM | POA: Diagnosis not present

## 2020-07-13 DIAGNOSIS — R030 Elevated blood-pressure reading, without diagnosis of hypertension: Secondary | ICD-10-CM | POA: Diagnosis not present

## 2020-07-13 DIAGNOSIS — E039 Hypothyroidism, unspecified: Secondary | ICD-10-CM | POA: Diagnosis not present

## 2020-07-13 DIAGNOSIS — R636 Underweight: Secondary | ICD-10-CM | POA: Diagnosis not present

## 2020-07-13 DIAGNOSIS — M1990 Unspecified osteoarthritis, unspecified site: Secondary | ICD-10-CM | POA: Diagnosis not present

## 2020-07-23 ENCOUNTER — Other Ambulatory Visit: Payer: Self-pay | Admitting: Family Medicine

## 2020-07-23 DIAGNOSIS — E119 Type 2 diabetes mellitus without complications: Secondary | ICD-10-CM

## 2020-07-26 DIAGNOSIS — H40001 Preglaucoma, unspecified, right eye: Secondary | ICD-10-CM | POA: Diagnosis not present

## 2020-07-26 LAB — HM DIABETES EYE EXAM

## 2020-08-19 ENCOUNTER — Other Ambulatory Visit: Payer: Self-pay | Admitting: Family Medicine

## 2020-08-19 DIAGNOSIS — Z1231 Encounter for screening mammogram for malignant neoplasm of breast: Secondary | ICD-10-CM

## 2020-08-25 ENCOUNTER — Other Ambulatory Visit: Payer: Self-pay

## 2020-08-25 ENCOUNTER — Other Ambulatory Visit: Payer: Self-pay | Admitting: Family Medicine

## 2020-08-25 ENCOUNTER — Ambulatory Visit
Admission: RE | Admit: 2020-08-25 | Discharge: 2020-08-25 | Disposition: A | Discharge status: Home / Self Care | Payer: Medicare HMO | Source: Ambulatory Visit | Attending: Family Medicine | Admitting: Family Medicine

## 2020-08-25 DIAGNOSIS — Z1231 Encounter for screening mammogram for malignant neoplasm of breast: Secondary | ICD-10-CM | POA: Diagnosis not present

## 2020-08-25 DIAGNOSIS — N6489 Other specified disorders of breast: Secondary | ICD-10-CM

## 2020-08-25 DIAGNOSIS — R928 Other abnormal and inconclusive findings on diagnostic imaging of breast: Secondary | ICD-10-CM

## 2020-08-31 ENCOUNTER — Ambulatory Visit
Admission: RE | Admit: 2020-08-31 | Discharge: 2020-08-31 | Disposition: A | Discharge status: Home / Self Care | Payer: Medicare HMO | Source: Ambulatory Visit | Attending: Family Medicine | Admitting: Family Medicine

## 2020-08-31 ENCOUNTER — Other Ambulatory Visit: Payer: Self-pay

## 2020-08-31 DIAGNOSIS — N6489 Other specified disorders of breast: Secondary | ICD-10-CM

## 2020-08-31 DIAGNOSIS — R928 Other abnormal and inconclusive findings on diagnostic imaging of breast: Secondary | ICD-10-CM | POA: Insufficient documentation

## 2020-08-31 DIAGNOSIS — R922 Inconclusive mammogram: Secondary | ICD-10-CM | POA: Diagnosis not present

## 2020-09-15 ENCOUNTER — Ambulatory Visit: Payer: Medicare HMO | Admitting: Family Medicine

## 2020-09-28 ENCOUNTER — Other Ambulatory Visit: Payer: Self-pay

## 2020-09-28 ENCOUNTER — Encounter: Payer: Self-pay | Admitting: Family Medicine

## 2020-09-28 ENCOUNTER — Ambulatory Visit (INDEPENDENT_AMBULATORY_CARE_PROVIDER_SITE_OTHER): Payer: Medicare HMO | Admitting: Family Medicine

## 2020-09-28 VITALS — BP 150/70 | HR 61 | Temp 97.1°F | Resp 18 | Ht 61.0 in | Wt 89.8 lb

## 2020-09-28 DIAGNOSIS — E440 Moderate protein-calorie malnutrition: Secondary | ICD-10-CM | POA: Diagnosis not present

## 2020-09-28 DIAGNOSIS — E1169 Type 2 diabetes mellitus with other specified complication: Secondary | ICD-10-CM

## 2020-09-28 DIAGNOSIS — I10 Essential (primary) hypertension: Secondary | ICD-10-CM | POA: Diagnosis not present

## 2020-09-28 LAB — POCT GLYCOSYLATED HEMOGLOBIN (HGB A1C): Hemoglobin A1C: 6.8 % — AB (ref 4.0–5.6)

## 2020-09-28 MED ORDER — VALSARTAN 40 MG PO TABS: 40.0000 mg | ORAL_TABLET | Freq: Every day | ORAL | 3 refills | Status: DC

## 2020-09-28 NOTE — Progress Notes (Signed)
Subjective:    Patient ID: Barbara Santos, female    DOB: 1935/07/31, 85 y.o.   MRN: [id number]  Barbara Santos is a 85 y.o. female presenting on 09/28/2020 for Diabetes   HPI   CHRONIC DM, Type 2: BMI >16.9  moderate protein calorie malnutrition Admits some fluctuation of sugar - Todayreports doing well on medicineoverall CBGs: Avg100 to 140s fasting AM, variety of other readings before and 2+ hours after meals, overall some 215 readings and below, most 150-170 detailed CBG log with her. Meds:Metformin XR 500mg  BID CurrentlyNOT onACEi / ARB-lasturine microalbuminnegative9/2021negative Lifestyle: Weight slightly down to 89 lbs improving diet. 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) UTDDM Eye visit [facility] Dr Sandra Cockayne, completed 10/2019, and completed cataract surgery Denies hypoglycemia  Elevated BP without HTN Reports avg readings SBP 130-140 / 60s home Bp readings in AM Current Meds -none never on med before Denies CP, dyspnea, HA, edema, dizziness / lightheadedness    Depression screen [facility] 2/9 03/17/2020 01/06/2020 09/15/2019  Decreased Interest 0 0 0  Down, Depressed, Hopeless 0 0 0  PHQ - 2 Score 0 0 0  Altered sleeping - - -  Tired, decreased energy - - -  Change in appetite - - -  Feeling bad or failure about yourself  - - -  Trouble concentrating - - -  Moving slowly or fidgety/restless - - -  Suicidal thoughts - - -  PHQ-9 Score - - -  Difficult doing work/chores - - -    Social History   Tobacco Use  . Smoking status: Never Smoker  . Smokeless tobacco: Never Used  Vaping Use  . Vaping Use: Never used  Substance Use Topics  . Alcohol use: No    Alcohol/week: 0.0 standard drinks  . Drug use: No    Review of Systems Per HPI unless specifically indicated above     Objective:    BP (!) 150/70 (BP Location: Left Arm, Cuff Size:  Normal)   Pulse 61   Temp (!) 97.1 F (36.2 C) (Temporal)   Resp 18   Ht 5\' 1"  (1.549 m)   Wt 89 lb 12.8 oz (40.7 kg)   SpO2 99%   BMI 16.97 kg/m   Wt Readings from Last 3 Encounters:  09/28/20 89 lb 12.8 oz (40.7 kg)  03/17/20 92 lb 12.8 oz (42.1 kg)  01/28/20 92 lb (41.7 kg)    Physical Exam Vitals and nursing note reviewed.  Constitutional:      General: She is not in acute distress.    Appearance: She is well-developed. She is not diaphoretic.     Comments: Well-appearing thin appearing, comfortable, cooperative  HENT:     Head: Normocephalic and atraumatic.  Eyes:     General:        Right eye: No discharge.        Left eye: No discharge.     Conjunctiva/sclera: Conjunctivae normal.  Neck:     Thyroid: No thyromegaly.  Cardiovascular:     Rate and Rhythm: Normal rate and regular rhythm.     Heart sounds: Normal heart sounds. No murmur heard.   Pulmonary:     Effort: Pulmonary effort is normal. No respiratory distress.     Breath sounds: Normal breath sounds. No wheezing or rales.  Musculoskeletal:        General: Normal range of motion.     Cervical back:  Normal range of motion and neck supple.     Right lower leg: No edema.     Left lower leg: No edema.  Lymphadenopathy:     Cervical: No cervical adenopathy.  Skin:    General: Skin is warm and dry.     Findings: No erythema or rash.  Neurological:     Mental Status: She is alert and oriented to person, place, and time.  Psychiatric:        Behavior: Behavior normal.     Comments: Well groomed, good eye contact, normal speech and thoughts      Recent Labs    03/10/20 0810 09/28/20 1011  HGBA1C 7.2* 6.8*     Results for orders placed or performed in visit on 09/28/20  POCT HgB A1C  Result Value Ref Range   Hemoglobin A1C 6.8 (A) 4.0 - 5.6 %      Assessment & Plan:   Problem List Items Addressed This Visit    Type 2 diabetes mellitus with other specified complication ([place]) - Primary     Improved M5Y to 6.8 Complicated by hypothyroidism and hyperlipidemia Last urine microalbumin negative 2021  Plan:  Continue Metformin XR 500 BID Encourage improved lifestyle -and acceptable to allow A1c / CBGs to increase to 7+ range Check CBG, bring log to next visit for review Not on ACEi/ARB - negative urine microalbumin - previously - START ARB low dose, check BMET in 4 weeks Continue Statin      Relevant Medications   valsartan (DIOVAN) 40 MG tablet   Other Relevant Orders   POCT HgB A1C (Completed)   Protein calorie malnutrition (HCC)    Weight slightly down to 89 lbs, prior 90-92 in past Appetite is decent, improving PO intake, limited by cost with only able to get Ensure/Boost once daily she may try for 2 daily but may need financial support can consider CCM referral/care guides.  Offered Mirtazapine, as option for inc appetite, she declines now will reconsider      Essential hypertension    Moderate elevated BP in office consistently Home BP mild elevated 130-140 range.  Will initiate new start anti HTN therapy with Valsartan 40mg  low dose, can cut in half for half tab 20mg  dose daily 1-2 weeks, check BP contact us if need or can take whole tab for 40mg  daily.      Relevant Medications   valsartan (DIOVAN) 40 MG tablet   Other Relevant Orders   BASIC METABOLIC PANEL WITH GFR      Meds ordered this encounter  Medications  . valsartan (DIOVAN) 40 MG tablet    Sig: Take 1 tablet (40 mg total) by mouth daily. May start with half tablet for first 2 weeks.    Dispense:  90 tablet    Refill:  3      Follow up plan: Return in about 4 weeks (around 10/26/2020) for 4 week non fasting lab only then 1 week later Follow-up HTN, lab result, med adjust.  Future labs ordered for 10/19/20 - BMET   Nobie Putnam, [facility] 09/28/2020, 10:09 AM

## 2020-09-28 NOTE — Assessment & Plan Note (Signed)
Weight slightly down to 89 lbs, prior 90-92 in past Appetite is decent, improving PO intake, limited by cost with only able to get Ensure/Boost once daily she may try for 2 daily but may need financial support can consider CCM referral/care guides.  Offered Mirtazapine, as option for inc appetite, she declines now will reconsider

## 2020-09-28 NOTE — Patient Instructions (Addendum)
Thank you for coming to the office today.  Recent Labs    03/10/20 0810 09/28/20 1011  HGBA1C 7.2* 6.8*   Weight slightly lower, keep working on diet.  Start the Valsartan 40mg  tablet once daily in morning - you can start by [place] for 20mg  once daily. For first 2 weeks approximately, if BP is still around 140 or higher, go ahead and increase to 1 WHOLE tablet 40mg  dose daily.  Keep track of readings. Contact me if new concerns, especially if LOW BP < 100 and or dizzy or lightheaded.  This med will keep kidneys healthy as well.  We will need to check a blood test in 4-6 weeks to make sure it is working.  Take 1 of the veggie and 1 fruit vitamin.  DUE for NON FASTING BLOOD WORK  You can eat and drink before  SCHEDULE "Lab Only" visit in the morning at the clinic for lab draw in [place]   - Make sure Lab Only appointment is at about 1 week before your next appointment, so that results will be available  For Lab Results, once available within 2-3 days of blood draw, you can can log in to MyChart online to view your results and a brief explanation. Also, we can discuss results at next follow-up visit.   Please schedule a Follow-up Appointment to: Return in about 4 weeks (around 10/26/2020) for 4 week non fasting lab only then 1 week later Follow-up HTN, lab result, med adjust.  If you have any other questions or concerns, please feel free to call the office or send a message through [place]. You may also schedule an earlier appointment if necessary.  Additionally, you may be receiving a survey about your experience at our office within a few days to 1 week by e-mail or mail. We value your feedback.  Nobie Putnam, DO [place]

## 2020-09-28 NOTE — Assessment & Plan Note (Signed)
Improved F6B to 6.8 Complicated by hypothyroidism and hyperlipidemia Last urine microalbumin negative 2021  Plan:  Continue Metformin XR 500 BID Encourage improved lifestyle -and acceptable to allow A1c / CBGs to increase to 7+ range Check CBG, bring log to next visit for review Not on ACEi/ARB - negative urine microalbumin - previously - START ARB low dose, check BMET in 4 weeks Continue Statin

## 2020-09-28 NOTE — Assessment & Plan Note (Signed)
Moderate elevated BP in office consistently Home BP mild elevated 130-140 range.  Will initiate new start anti HTN therapy with Valsartan 40mg  low dose, can cut in half for half tab 20mg  dose daily 1-2 weeks, check BP contact us if need or can take whole tab for 40mg  daily.

## 2020-09-29 ENCOUNTER — Ambulatory Visit: Payer: Medicare HMO | Admitting: Family Medicine

## 2020-10-19 ENCOUNTER — Other Ambulatory Visit: Payer: Medicare HMO

## 2020-10-19 ENCOUNTER — Other Ambulatory Visit: Payer: Self-pay

## 2020-10-19 DIAGNOSIS — I10 Essential (primary) hypertension: Secondary | ICD-10-CM | POA: Diagnosis not present

## 2020-10-20 LAB — BASIC METABOLIC PANEL WITH GFR
BUN: 15 mg/dL (ref 7–25)
CO2: 29 mmol/L (ref 20–32)
Calcium: 9.8 mg/dL (ref 8.6–10.4)
Chloride: 100 mmol/L (ref 98–110)
Creat: 0.61 mg/dL (ref 0.60–0.88)
GFR, Est African American: 98 mL/min/{1.73_m2} (ref >=60)
GFR, Est Non African American: 84 mL/min/{1.73_m2} (ref >=60)
Glucose, Bld: 124 mg/dL — ABNORMAL HIGH (ref 65–99)
Potassium: 4.4 mmol/L (ref 3.5–5.3)
Sodium: 134 mmol/L — ABNORMAL LOW (ref 135–146)

## 2020-10-26 ENCOUNTER — Ambulatory Visit (INDEPENDENT_AMBULATORY_CARE_PROVIDER_SITE_OTHER): Payer: Medicare HMO | Admitting: Family Medicine

## 2020-10-26 ENCOUNTER — Encounter: Payer: Self-pay | Admitting: Family Medicine

## 2020-10-26 ENCOUNTER — Other Ambulatory Visit: Payer: Self-pay

## 2020-10-26 ENCOUNTER — Other Ambulatory Visit: Payer: Self-pay | Admitting: Family Medicine

## 2020-10-26 VITALS — BP 137/61 | HR 73 | Ht 61.0 in | Wt 89.6 lb

## 2020-10-26 DIAGNOSIS — E1169 Type 2 diabetes mellitus with other specified complication: Secondary | ICD-10-CM

## 2020-10-26 DIAGNOSIS — I10 Essential (primary) hypertension: Secondary | ICD-10-CM

## 2020-10-26 DIAGNOSIS — E440 Moderate protein-calorie malnutrition: Secondary | ICD-10-CM

## 2020-10-26 DIAGNOSIS — E039 Hypothyroidism, unspecified: Secondary | ICD-10-CM

## 2020-10-26 DIAGNOSIS — Z0000 Encounter for general adult medical examination without abnormal findings: Secondary | ICD-10-CM

## 2020-10-26 NOTE — Assessment & Plan Note (Signed)
Improved now Well-controlled HTN - Home BP readings reviewed, normal, has cuff  No known complications    Plan:  1.  Continue current BP regimen Valsartan 40mg  daily 2. Encourage improved lifestyle - low sodium diet, regular exercise 3. Continue monitor BP outside office, bring readings to next visit, if persistently >140/90 or new symptoms notify office sooner

## 2020-10-26 NOTE — Progress Notes (Signed)
Subjective:    Patient ID: Barbara Santos, female    DOB: 1935-09-15, 85 y.o.   MRN: [id number]  Barbara Santos is a 85 y.o. female presenting on 10/26/2020 for Hypertension and Diabetes   HPI  CHRONIC DM, Type 2: BMI >16 moderate protein calorie malnutrition - Today reports doing well on medicine overall CBGs: Avg 100 to 140s fasting AM, variety of other readings before and 2+ hours after meals, overall some 215 readings and below, most 150-170 detailed CBG log with her. Meds: Metformin XR 500mg  BID Currently NOT on ACEi / ARB - last urine microalbumin negative 9/2021negative Lifestyle: Weight slightly down to 89 lbs improving diet. 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 Current Meds - Valsartan 40mg  daily   Reports good compliance, took meds today. Tolerating well, w/o complaints. Lifestyle: - Diet: Limited sodium diet now, avoid salt added. Limited meats Admits mild HA in AM self limited Denies CP, dyspnea, edema, dizziness / lightheadedness   Depression screen [facility] 2/9 03/17/2020 01/06/2020 09/15/2019  Decreased Interest 0 0 0  Down, Depressed, Hopeless 0 0 0  PHQ - 2 Score 0 0 0  Altered sleeping - - -  Tired, decreased energy - - -  Change in appetite - - -  Feeling bad or failure about yourself  - - -  Trouble concentrating - - -  Moving slowly or fidgety/restless - - -  Suicidal thoughts - - -  PHQ-9 Score - - -  Difficult doing work/chores - - -    Social History   Tobacco Use   Smoking status: Never   Smokeless tobacco: Never  Vaping Use   Vaping Use: Never used   Substance Use Topics   Alcohol use: No    Alcohol/week: 0.0 standard drinks   Drug use: No    Review of Systems Per HPI unless specifically indicated above     Objective:    BP 137/61   Pulse 73   Ht 5\' 1"  (1.549 m)   Wt 89 lb 9.6 oz (40.6 kg)   SpO2 100%   BMI 16.93 kg/m   Wt Readings from Last 3 Encounters:  10/26/20 89 lb 9.6 oz (40.6 kg)  09/28/20 89 lb 12.8 oz (40.7 kg)  03/17/20 92 lb 12.8 oz (42.1 kg)    Physical Exam Vitals and nursing note reviewed.  Constitutional:      General: She is not in acute distress.    Appearance: Normal appearance. She is well-developed. She is not diaphoretic.     Comments: Well-appearing, comfortable, cooperative  HENT:     Head: Normocephalic and atraumatic.  Eyes:     General:        Right eye: No discharge.        Left eye: No discharge.     Conjunctiva/sclera: Conjunctivae normal.  Cardiovascular:     Rate and Rhythm: Normal rate.  Pulmonary:     Effort: Pulmonary effort is normal.  Skin:    General: Skin is warm and dry.     Findings: No erythema or rash.  Neurological:     Mental Status:  She is alert and oriented to person, place, and time.  Psychiatric:        Mood and Affect: Mood normal.        Behavior: Behavior normal.        Thought Content: Thought content normal.     Comments: Well groomed, good eye contact, normal speech and thoughts   Recent Labs    03/10/20 0810 09/28/20 1011  HGBA1C 7.2* 6.8*     Results for orders placed or performed in visit on 92/11/94  BASIC METABOLIC PANEL WITH GFR  Result Value Ref Range   Glucose, Bld 124 (H) 65 - 99 mg/dL   BUN 15 7 - 25 mg/dL   Creat 0.61 0.60 - 0.88 mg/dL   GFR, Est Non African American 84 > OR = 60 mL/min/1.69m2   GFR, Est African American 98 > OR = 60 mL/min/1.47m2   BUN/Creatinine Ratio NOT APPLICABLE 6 - 22 (calc)   Sodium 134 (L) 135 - 146 mmol/L   Potassium 4.4 3.5 - 5.3 mmol/L   Chloride 100 98 - 110 mmol/L   CO2 29 20 - 32 mmol/L    Calcium 9.8 8.6 - 10.4 mg/dL      Assessment & Plan:   Problem List Items Addressed This Visit     Type 2 diabetes mellitus with other specified complication (HCC) - Primary    Stable R7E 6.8 0/8/14 Complicated by hypothyroidism and hyperlipidemia Last urine microalbumin negative 2021  Plan:  Continue Metformin XR 500 BID Encourage improved lifestyle -and acceptable to allow A1c / CBGs to increase to 7+ range Check CBG, bring log to next visit for review Continue ARB Continue Statin       Essential hypertension    Improved now Well-controlled HTN - Home BP readings reviewed, normal, has cuff  No known complications    Plan:  1.  Continue current BP regimen Valsartan 40mg  daily 2. Encourage improved lifestyle - low sodium diet, regular exercise 3. Continue monitor BP outside office, bring readings to next visit, if persistently >140/90 or new symptoms notify office sooner        No orders of the defined types were placed in this encounter.    Follow up plan: Return in about 5 months (around 03/21/2021) for Approx 1 week after Thanksgiving fasting lab only then Annual Physical.  Future labs ordered for 03/12/21   Barbara Santos, [facility] 10/26/2020, 9:39 AM

## 2020-10-26 NOTE — Patient Instructions (Addendum)
Thank you for coming to the office today.  Continue Valsartan currently as prescribed.  Lab result looks great, kidney potassium.  DUE for FASTING BLOOD WORK (no food or drink after midnight before the lab appointment, only water or coffee without cream/sugar on the morning of)  SCHEDULE "Lab Only" visit in the morning at the clinic for lab draw in 5 MONTHS   - Make sure Lab Only appointment is at about 1 week before your next appointment, so that results will be available  For Lab Results, once available within 2-3 days of blood draw, you can can log in to MyChart online to view your results and a brief explanation. Also, we can discuss results at next follow-up visit.   Please schedule a Follow-up Appointment to: Return in about 5 months (around 03/21/2021) for Approx 1 week after Thanksgiving fasting lab only then Annual Physical.  If you have any other questions or concerns, please feel free to call the office or send a message through [place]. You may also schedule an earlier appointment if necessary.  Additionally, you may be receiving a survey about your experience at our office within a few days to 1 week by e-mail or mail. We value your feedback.  Nobie Putnam, DO [place]

## 2020-10-26 NOTE — Assessment & Plan Note (Signed)
Stable T2K 6.8 07/29/26 Complicated by hypothyroidism and hyperlipidemia Last urine microalbumin negative 2021  Plan:  Continue Metformin XR 500 BID Encourage improved lifestyle -and acceptable to allow A1c / CBGs to increase to 7+ range Check CBG, bring log to next visit for review Continue ARB Continue Statin

## 2020-11-12 ENCOUNTER — Other Ambulatory Visit: Payer: Self-pay | Admitting: Family Medicine

## 2020-11-12 DIAGNOSIS — E7800 Pure hypercholesterolemia, unspecified: Secondary | ICD-10-CM

## 2020-12-30 ENCOUNTER — Telehealth: Payer: Self-pay | Admitting: Family Medicine

## 2020-12-30 NOTE — Telephone Encounter (Signed)
Pt is calling back to sch AWV

## 2020-12-30 NOTE — Telephone Encounter (Signed)
Left message for patient to call back and schedule the Medicare Annual Wellness Visit (AWV) virtually or by telephone.  Last AWV 01/06/20  Please schedule at anytime with [place].  40 minute appointment  Any questions, please call me at [phone]

## 2021-01-11 ENCOUNTER — Ambulatory Visit (INDEPENDENT_AMBULATORY_CARE_PROVIDER_SITE_OTHER): Payer: Medicare HMO

## 2021-01-11 VITALS — Ht 61.0 in | Wt 89.0 lb

## 2021-01-11 DIAGNOSIS — Z0000 Encounter for general adult medical examination without abnormal findings: Secondary | ICD-10-CM

## 2021-01-11 NOTE — Progress Notes (Signed)
I connected with Barbara Santos today by telephone and verified that I am speaking with the correct person using two identifiers. Location patient: home Location provider: work Persons participating in the virtual visit: Barbara Santos, Barbara Durand LPN.   I discussed the limitations, risks, security and privacy concerns of performing an evaluation and management service by telephone and the availability of in person appointments. I also discussed with the patient that there may be a patient responsible charge related to this service. The patient expressed understanding and verbally consented to this telephonic visit.    Interactive audio and video telecommunications were attempted between this provider and patient, however failed, due to patient having technical difficulties OR patient did not have access to video capability.  We continued and completed visit with audio only.     Vital signs may be patient reported or missing.  Subjective:   Barbara Santos is a 85 y.o. female who presents for Medicare Annual (Subsequent) preventive examination.  Review of Systems     Cardiac Risk Factors include: advanced age (>16men, >24 women);diabetes mellitus;hypertension;sedentary lifestyle     Objective:    Today's Vitals   01/11/21 1436  Weight: 89 lb (40.4 kg)  Height: 5\' 1"  (1.549 m)   Body mass index is 16.82 kg/m.  Advanced Directives 01/11/2021 01/28/2020 01/06/2020 12/31/2019 11/19/2018 12/31/2017 11/13/2017  Does Patient Have a Medical Advance Directive? Yes Yes Yes Yes Yes Yes Yes  Type of Paramedic of [place];Living will [place];Living will Living will;Healthcare Power of [place];Living will Living will;Healthcare Power of Attorney Living will Living will;Healthcare Power of Attorney  Does patient want to make changes to medical advance directive? - No - Patient declined - No - Patient declined - - -   Copy of Stamps in Chart? No - copy requested Yes - validated most recent copy scanned in chart (See row information) No - copy requested Yes - validated most recent copy scanned in chart (See row information) No - copy requested - No - copy requested    Current Medications (verified) Outpatient Encounter Medications as of 01/11/2021  Medication Sig   Cholecalciferol (VITAMIN D) 2000 UNITS tablet Take 2,000 Units by mouth daily.   diphenhydrAMINE (BENADRYL) 25 MG tablet Take 25 mg by mouth every 6 (six) hours as needed.   dorzolamide-timolol (COSOPT) 22.3-6.8 MG/ML ophthalmic solution Place 1 drop into both eyes 2 (two) times daily.    lactose free nutrition (BOOST) LIQD Take 237 mLs by mouth 3 (three) times daily between meals.   levothyroxine (SYNTHROID) 50 MCG tablet Take 1 tablet (50 mcg total) by mouth daily before breakfast.   metFORMIN (GLUCOPHAGE-XR) 500 MG 24 hr tablet Take 1 tablet (500 mg total) by mouth 2 (two) times daily with a meal.   Multiple Vitamin (MULTIVITAMIN) tablet Take 1 tablet by mouth daily.   Nutritional Supplements (JUICE PLUS FIBRE PO) Take by mouth.   ONETOUCH DELICA LANCETS FINE MISC 1 each by Other route 3 (three) times daily.   ONETOUCH VERIO test strip CHECK BLOOD SUGAR UP TO ONCE DAILY   rosuvastatin (CRESTOR) 5 MG tablet TAKE 1 TABLET AT BEDTIME   valsartan (DIOVAN) 40 MG tablet Take 1 tablet (40 mg total) by mouth daily. May start with half tablet for first 2 weeks.   No facility-administered encounter medications on file as of 01/11/2021.    Allergies (verified) Amoxicillin and Penicillins   History: Past Medical History:  Diagnosis Date  Arthritis    Basal cell carcinoma 08/23/2009   Right dorsum lat. distal forearm.    Diabetes mellitus, type 2 ([place])    Dysplastic nevus 08/03/2008   Right ant. thigh, just above knee. Moderate to marked atypia, extends to base. Excised 09/23/2008, margins free.   Dysplastic nevus 08/23/2009    Right post. sup. thigh. MIld atypia, limited margins free.   Glaucoma    Hx of basal cell carcinoma    R cheek   Hx of squamous cell carcinoma of skin 11/01/2009   L medial mid calf   Hyperlipidemia    Hypothyroidism    Insomnia    Left hip pain    Protein calorie malnutrition (HCC)    Squamous cell carcinoma of skin 08/23/2009   Left medial mid calf. SCCis   Past Surgical History:  Procedure Laterality Date   CATARACT EXTRACTION W/PHACO Left 12/31/2019   Procedure: CATARACT EXTRACTION PHACO AND INTRAOCULAR LENS PLACEMENT (IOC) LEFT DIABETIC 10.20  01:17.6  13.1%;  Surgeon: Leandrew Koyanagi, MD;  Location: [place];  Service: Ophthalmology;  Laterality: Left;  Diabetic - oral meds   CATARACT EXTRACTION W/PHACO Right 01/28/2020   Procedure: CATARACT EXTRACTION PHACO AND INTRAOCULAR LENS PLACEMENT (IOC) RIGHT DIABETIC 13.67 01:34.5 14.5%;  Surgeon: Leandrew Koyanagi, MD;  Location: [place];  Service: Ophthalmology;  Laterality: Right;   COLONOSCOPY WITH PROPOFOL N/A 12/31/2017   Procedure: COLONOSCOPY WITH PROPOFOL;  Surgeon: Manya Silvas, MD;  Location: [facility] ENDOSCOPY;  Service: Endoscopy;  Laterality: N/A;   HERNIA REPAIR     TOTAL ABDOMINAL HYSTERECTOMY  2009   Family History  Problem Relation Age of Onset   Diabetes Mellitus II Mother    Stroke Father    Stroke Maternal Grandmother    Heart disease Paternal Grandfather    Breast cancer Sister 63   Social History   Socioeconomic History   Marital status: Widowed    Spouse name: Not on file   Number of children: Not on file   Years of education: Not on file   Highest education level: High school graduate  Occupational History   Not on file  Tobacco Use   Smoking status: Never   Smokeless tobacco: Never  Vaping Use   Vaping Use: Never used  Substance and Sexual Activity   Alcohol use: No    Alcohol/week: 0.0 standard drinks   Drug use: No   Sexual activity: Not on file  Other Topics  Concern   Not on file  Social History Narrative   Not on file   Social Determinants of Health   Financial Resource Strain: Low Risk    Difficulty of Paying Living Expenses: Not hard at all  Food Insecurity: No Food Insecurity   Worried About Charity fundraiser in the Last Year: Never true   [place] in the Last Year: Never true  Transportation Needs: No Transportation Needs   Lack of Transportation (Medical): No   Lack of Transportation (Non-Medical): No  Physical Activity: Inactive   Days of Exercise per Week: 0 days   Minutes of Exercise per Session: 0 min  Stress: No Stress Concern Present   Feeling of Stress : Not at all  Social Connections: Not on file    Tobacco Counseling Counseling given: Not Answered   Clinical Intake:  Pre-visit preparation completed: Yes  Pain : No/denies pain     Nutritional Status: BMI <19  Underweight Nutritional Risks: None Diabetes: Yes  How often do you  need to have someone help you when you read instructions, pamphlets, or other written materials from your doctor or pharmacy?: 1 - Never  Diabetic? Yes Nutrition Risk Assessment:  Has the patient had any N/V/D within the last 2 months?  No  Does the patient have any non-healing wounds?  No  Has the patient had any unintentional weight loss or weight gain?  No   Diabetes:  Is the patient diabetic?  Yes  If diabetic, was a CBG obtained today?  No  Did the patient bring in their glucometer from home?  No  How often do you monitor your CBG's? daily.   Financial Strains and Diabetes Management:  Are you having any financial strains with the device, your supplies or your medication? No .  Does the patient want to be seen by Chronic Care Management for management of their diabetes?  No  Would the patient like to be referred to a Nutritionist or for Diabetic Management?  No   Diabetic Exams:  Diabetic Eye Exam: Completed 11/11/2019 Diabetic Foot Exam: Completed  03/17/2020   Interpreter Needed?: No  Information entered by :: NAllen LPN   Activities of Daily Living In your present state of health, do you have any difficulty performing the following activities: 01/11/2021 01/28/2020  Hearing? N N  Vision? N N  Difficulty concentrating or making decisions? N N  Walking or climbing stairs? N N  Dressing or bathing? N N  Doing errands, shopping? N -  Preparing Food and eating ? N -  Using the Toilet? N -  In the past six months, have you accidently leaked urine? N -  Do you have problems with loss of bowel control? N -  Managing your Medications? N -  Managing your Finances? N -  Housekeeping or managing your Housekeeping? N -  Some recent data might be hidden    Patient Care Team: Olin Hauser, DO as PCP - General (Family Medicine)  Indicate any recent Medical Services you may have received from other than [facility] providers in the past year (date may be approximate).     Assessment:   This is a routine wellness examination for Salinda.  Hearing/Vision screen Vision Screening - Comments:: Regular eye exams, [facility]  Dietary issues and exercise activities discussed: Current Exercise Habits: The patient does not participate in regular exercise at present   Goals Addressed             This Visit's Progress    Patient Stated       01/11/2021, stay healthy       Depression Screen PHQ 2/9 Scores 01/11/2021 03/17/2020 01/06/2020 09/15/2019 11/19/2018 09/19/2018 03/20/2018  PHQ - 2 Score 0 0 0 0 0 0 0  PHQ- 9 Score - - - - - - -    Fall Risk Fall Risk  01/11/2021 10/26/2020 03/17/2020 01/06/2020 09/15/2019  Falls in the past year? 1 1 0 1 0  Comment tripped - - tripped over a rock -  Number falls in past yr: 0 1 0 0 0  Injury with Fall? 0 0 0 0 0  Risk for fall due to : Medication side effect - - Impaired balance/gait;History of fall(s);Medication side effect -  Follow up Falls evaluation completed;Education  provided;Falls prevention discussed - Falls evaluation completed Falls evaluation completed;Education provided;Falls prevention discussed Falls evaluation completed    FALL RISK PREVENTION PERTAINING TO THE HOME:  Any stairs in or around the home? No  If so, are  there any without handrails?  N/a Home free of loose throw rugs in walkways, pet beds, electrical cords, etc? Yes  Adequate lighting in your home to reduce risk of falls? Yes   ASSISTIVE DEVICES UTILIZED TO PREVENT FALLS:  Life alert? Yes  Use of a cane, walker or w/c? No  Grab bars in the bathroom? Yes  Shower chair or bench in shower? Yes  Elevated toilet seat or a handicapped toilet? Yes   TIMED UP AND GO:  Was the test performed? No .      Cognitive Function: MMSE - Mini Mental State Exam 11/06/2014  Orientation to time 5  Orientation to Place 5  Registration 3  Attention/ Calculation 5  Recall 3  Language- name 2 objects 2  Language- repeat 1  Language- follow 3 step command 3  Language- read & follow direction 1  Write a sentence 1  Copy design 1  Total score 30     6CIT Screen 01/11/2021 01/06/2020 11/19/2018 11/13/2017 10/24/2016  What Year? 0 points 0 points 0 points 0 points 0 points  What month? 0 points 0 points 0 points 0 points 0 points  What time? 0 points 0 points 0 points 0 points 0 points  Count back from 20 0 points 0 points 0 points 0 points 0 points  Months in reverse 0 points 2 points 0 points 0 points 0 points  Repeat phrase 0 points 0 points 0 points 0 points 0 points  Total Score 0 2 0 0 0    Immunizations Immunization History  Administered Date(s) Administered   Fluad Quad(high Dose 65+) 01/07/2020   Influenza, High Dose Seasonal PF 01/05/2015, 02/24/2016, 01/16/2017, 01/14/2018, 01/06/2019   Influenza-Unspecified 01/28/2014, 01/14/2018   PFIZER(Purple Top)SARS-COV-2 Vaccination 05/14/2019, 06/07/2019, 02/25/2020   Pneumococcal Conjugate-13 12/03/2013   Pneumococcal  Polysaccharide-23 10/24/2016   Tdap 12/03/2013   Zoster Recombinat (Shingrix) 12/04/2018, 02/17/2019, 03/06/2019   Zoster, Live 04/24/2010    TDAP status: Up to date  Flu Vaccine status: Due, Education has been provided regarding the importance of this vaccine. Advised may receive this vaccine at local pharmacy or Health Dept. Aware to provide a copy of the vaccination record if obtained from local pharmacy or Health Dept. Verbalized acceptance and understanding.  Pneumococcal vaccine status: Up to date  Covid-19 vaccine status: Completed vaccines  Qualifies for Shingles Vaccine? Yes   Zostavax completed Yes   Shingrix Completed?: Yes  Screening Tests Health Maintenance  Topic Date Due   COVID-19 Vaccine (4 - Booster for Pfizer series) 05/19/2020   OPHTHALMOLOGY EXAM  11/10/2020   INFLUENZA VACCINE  11/22/2020   FOOT EXAM  03/17/2021   HEMOGLOBIN A1C  03/30/2021   TETANUS/TDAP  12/04/2023   DEXA SCAN  Completed   Zoster Vaccines- Shingrix  Completed   HPV VACCINES  Aged Out    Health Maintenance  Health Maintenance Due  Topic Date Due   COVID-19 Vaccine (4 - Booster for Pfizer series) 05/19/2020   OPHTHALMOLOGY EXAM  11/10/2020   INFLUENZA VACCINE  11/22/2020    Colorectal cancer screening: No longer required.   Mammogram status: Completed 08/31/2020. Repeat every year  Bone Density status: Completed 04/24/2012  Lung Cancer Screening: (Low Dose CT Chest recommended if Age 24-80 years, 30 pack-year currently smoking OR have quit w/in 15years.) does not qualify.   Lung Cancer Screening Referral: no  Additional Screening:  Hepatitis C Screening: does not qualify;  Vision Screening: Recommended annual ophthalmology exams for early detection of glaucoma and other  disorders of the eye. Is the patient up to date with their annual eye exam?  Yes  Who is the provider or what is the name of the office in which the patient attends annual eye exams? [facility] If  pt is not established with a provider, would they like to be referred to a provider to establish care? No .   Dental Screening: Recommended annual dental exams for proper oral hygiene  Community Resource Referral / Chronic Care Management: CRR required this visit?  No   CCM required this visit?  No      Plan:     I have personally reviewed and noted the following in the patient's chart:   Medical and social history Use of alcohol, tobacco or illicit drugs  Current medications and supplements including opioid prescriptions.  Functional ability and status Nutritional status Physical activity Advanced directives List of other physicians Hospitalizations, surgeries, and ER visits in previous 12 months Vitals Screenings to include cognitive, depression, and falls Referrals and appointments  In addition, I have reviewed and discussed with patient certain preventive protocols, quality metrics, and best practice recommendations. A written personalized care plan for preventive services as well as general preventive health recommendations were provided to patient.     Kellie Simmering, LPN   07/16/4980   Nurse Notes:

## 2021-01-11 NOTE — Patient Instructions (Signed)
[name] , Thank you for taking time to come for your Medicare Wellness Visit. I appreciate your ongoing commitment to your health goals. Please review the following plan we discussed and let me know if I can assist you in the future.   Screening recommendations/referrals: Colonoscopy: not required Mammogram: completed 08/31/2020 Bone Density: completed 04/24/2012 Recommended yearly ophthalmology/optometry visit for glaucoma screening and checkup Recommended yearly dental visit for hygiene and checkup  Vaccinations: Influenza vaccine: due Pneumococcal vaccine: completed 10/24/2016 Tdap vaccine: completed 12/03/2013, due 12/04/2023 Shingles vaccine: completed   Covid-19: 02/25/2020, 06/07/2019, 05/14/2019  Advanced directives: Please bring a copy of your POA (Power of Attorney) and/or Living Will to your next appointment.   Conditions/risks identified: none  Next appointment: Follow up in one year for your annual wellness visit    Preventive Care 65 Years and Older, Female Preventive care refers to lifestyle choices and visits with your health care provider that can promote health and wellness. What does preventive care include? A yearly physical exam. This is also called an annual well check. Dental exams once or twice a year. Routine eye exams. Ask your health care provider how often you should have your eyes checked. Personal lifestyle choices, including: Daily care of your teeth and gums. Regular physical activity. Eating a healthy diet. Avoiding tobacco and drug use. Limiting alcohol use. Practicing safe sex. Taking low-dose aspirin every day. Taking vitamin and mineral supplements as recommended by your health care provider. What happens during an annual well check? The services and screenings done by your health care provider during your annual well check will depend on your age, overall health, lifestyle risk factors, and family history of disease. Counseling  Your health  care provider may ask you questions about your: Alcohol use. Tobacco use. Drug use. Emotional well-being. Home and relationship well-being. Sexual activity. Eating habits. History of falls. Memory and ability to understand (cognition). Work and work Statistician. Reproductive health. Screening  You may have the following tests or measurements: Height, weight, and BMI. Blood pressure. Lipid and cholesterol levels. These may be checked every 5 years, or more frequently if you are over 15 years old. Skin check. Lung cancer screening. You may have this screening every year starting at age 28 if you have a 30-pack-year history of smoking and currently smoke or have quit within the past 15 years. Fecal occult blood test (FOBT) of the stool. You may have this test every year starting at age 18. Flexible sigmoidoscopy or colonoscopy. You may have a sigmoidoscopy every 5 years or a colonoscopy every 10 years starting at age 35. Hepatitis C blood test. Hepatitis B blood test. Sexually transmitted disease (STD) testing. Diabetes screening. This is done by checking your blood sugar (glucose) after you have not eaten for a while (fasting). You may have this done every 1-3 years. Bone density scan. This is done to screen for osteoporosis. You may have this done starting at age 54. Mammogram. This may be done every 1-2 years. Talk to your health care provider about how often you should have regular mammograms. Talk with your health care provider about your test results, treatment options, and if necessary, the need for more tests. Vaccines  Your health care provider may recommend certain vaccines, such as: Influenza vaccine. This is recommended every year. Tetanus, diphtheria, and acellular pertussis (Tdap, Td) vaccine. You may need a Td booster every 10 years. Zoster vaccine. You may need this after age 6. Pneumococcal 13-valent conjugate (PCV13) vaccine. One dose is  recommended after age  76. Pneumococcal polysaccharide (PPSV23) vaccine. One dose is recommended after age 10. Talk to your health care provider about which screenings and vaccines you need and how often you need them. This information is not intended to replace advice given to you by your health care provider. Make sure you discuss any questions you have with your health care provider. Document Released: 05/07/2015 Document Revised: 12/29/2015 Document Reviewed: 02/09/2015 Elsevier Interactive Patient Education  2017 [employer] [employer] Prevention in the Home Falls can cause injuries. They can happen to people of all ages. There are many things you can do to make your home safe and to help prevent falls. What can I do on the outside of my home? Regularly fix the edges of walkways and driveways and fix any cracks. Remove anything that might make you trip as you walk through a door, such as a raised step or threshold. Trim any bushes or trees on the path to your home. Use bright outdoor lighting. Clear any walking paths of anything that might make someone trip, such as rocks or tools. Regularly check to see if handrails are loose or broken. Make sure that both sides of any steps have handrails. Any raised decks and porches should have guardrails on the edges. Have any leaves, snow, or ice cleared regularly. Use sand or salt on walking paths during winter. Clean up any spills in your garage right away. This includes oil or grease spills. What can I do in the bathroom? Use night lights. Install grab bars by the toilet and in the tub and shower. Do not use towel bars as grab bars. Use non-skid mats or decals in the tub or shower. If you need to sit down in the shower, use a plastic, non-slip stool. Keep the floor dry. Clean up any water that spills on the floor as soon as it happens. Remove soap buildup in the tub or shower regularly. Attach bath mats securely with double-sided non-slip rug tape. Do not have throw  rugs and other things on the floor that can make you trip. What can I do in the bedroom? Use night lights. Make sure that you have a light by your bed that is easy to reach. Do not use any sheets or blankets that are too big for your bed. They should not hang down onto the floor. Have a firm chair that has side arms. You can use this for support while you get dressed. Do not have throw rugs and other things on the floor that can make you trip. What can I do in the kitchen? Clean up any spills right away. Avoid walking on wet floors. Keep items that you use a lot in easy-to-reach places. If you need to reach something above you, use a strong step stool that has a grab bar. Keep electrical cords out of the way. Do not use floor polish or wax that makes floors slippery. If you must use wax, use non-skid floor wax. Do not have throw rugs and other things on the floor that can make you trip. What can I do with my stairs? Do not leave any items on the stairs. Make sure that there are handrails on both sides of the stairs and use them. Fix handrails that are broken or loose. Make sure that handrails are as long as the stairways. Check any carpeting to make sure that it is firmly attached to the stairs. Fix any carpet that is loose or worn. Avoid having  throw rugs at the top or bottom of the stairs. If you do have throw rugs, attach them to the floor with carpet tape. Make sure that you have a light switch at the top of the stairs and the bottom of the stairs. If you do not have them, ask someone to add them for you. What else can I do to help prevent falls? Wear shoes that: Do not have high heels. Have rubber bottoms. Are comfortable and fit you well. Are closed at the toe. Do not wear sandals. If you use a stepladder: Make sure that it is fully opened. Do not climb a closed stepladder. Make sure that both sides of the stepladder are locked into place. Ask someone to hold it for you, if  possible. Clearly mark and make sure that you can see: Any grab bars or handrails. First and last steps. Where the edge of each step is. Use tools that help you move around (mobility aids) if they are needed. These include: Canes. Walkers. Scooters. Crutches. Turn on the lights when you go into a dark area. Replace any light bulbs as soon as they burn out. Set up your furniture so you have a clear path. Avoid moving your furniture around. If any of your floors are uneven, fix them. If there are any pets around you, be aware of where they are. Review your medicines with your doctor. Some medicines can make you feel dizzy. This can increase your chance of falling. Ask your doctor what other things that you can do to help prevent falls. This information is not intended to replace advice given to you by your health care provider. Make sure you discuss any questions you have with your health care provider. Document Released: 02/04/2009 Document Revised: 09/16/2015 Document Reviewed: 05/15/2014 Elsevier Interactive Patient Education  2017 [employer].

## 2021-01-20 ENCOUNTER — Other Ambulatory Visit: Payer: Self-pay | Admitting: Family Medicine

## 2021-01-20 DIAGNOSIS — E119 Type 2 diabetes mellitus without complications: Secondary | ICD-10-CM

## 2021-01-25 DIAGNOSIS — H401122 Primary open-angle glaucoma, left eye, moderate stage: Secondary | ICD-10-CM | POA: Diagnosis not present

## 2021-02-01 DIAGNOSIS — H40001 Preglaucoma, unspecified, right eye: Secondary | ICD-10-CM | POA: Diagnosis not present

## 2021-02-12 ENCOUNTER — Other Ambulatory Visit: Payer: Self-pay | Admitting: Family Medicine

## 2021-02-12 DIAGNOSIS — E7800 Pure hypercholesterolemia, unspecified: Secondary | ICD-10-CM

## 2021-02-13 NOTE — Telephone Encounter (Signed)
Requested Prescriptions  Pending Prescriptions Disp Refills  . rosuvastatin (CRESTOR) 5 MG tablet [Pharmacy Med Name: ROSUVASTATIN TAB 5MG ] 90 tablet 0    Sig: TAKE 1 TABLET AT BEDTIME     Cardiovascular:  Antilipid - Statins Passed - 02/12/2021  4:37 PM      Passed - Total Cholesterol in normal range and within 360 days    Cholesterol, Total  Date Value Ref Range Status  01/12/2015 176 100 - 199 mg/dL Final   Cholesterol  Date Value Ref Range Status  03/10/2020 133 <200 mg/dL Final         Passed - LDL in normal range and within 360 days    LDL Cholesterol (Calc)  Date Value Ref Range Status  03/10/2020 57 mg/dL (calc) Final    Comment:    Reference range: <100 . Desirable range <100 mg/dL for primary prevention;   <70 mg/dL for patients with CHD or diabetic patients  with > or = 2 CHD risk factors. Marland Kitchen LDL-C is now calculated using the Martin-Hopkins  calculation, which is a validated novel method providing  better accuracy than the Friedewald equation in the  estimation of LDL-C.  Cresenciano Genre et al. Annamaria Helling. 8546;270(35): 2061-2068  (http://education.[facility].com/faq/FAQ164)          Passed - HDL in normal range and within 360 days    HDL  Date Value Ref Range Status  03/10/2020 62 > OR = 50 mg/dL Final  01/12/2015 54 >39 mg/dL Final    Comment:    According to ATP-III Guidelines, HDL-C >59 mg/dL is considered a negative risk factor for CHD.          Passed - Triglycerides in normal range and within 360 days    Triglycerides  Date Value Ref Range Status  03/10/2020 68 <150 mg/dL Final         Passed - Patient is not pregnant      Passed - Valid encounter within last 12 months    Recent Outpatient Visits          3 months ago Type 2 diabetes mellitus with other specified complication, without long-term current use of insulin ([place])   [facility], Devonne Doughty, DO   4 months ago Type 2 diabetes mellitus with other specified  complication, without long-term current use of insulin [facility])   [facility] Olin Hauser, DO   11 months ago Annual physical exam   [facility] Olin Hauser, DO   1 year ago Acute cystitis with hematuria   [facility] Olin Hauser, DO   1 year ago Type 2 diabetes mellitus with other specified complication, without long-term current use of insulin ([place])   [facility], Devonne Doughty, DO      Future Appointments            In 1 month [facility], Devonne Doughty, [facility], [place]   In 11 months  [facility], [facility]

## 2021-03-14 ENCOUNTER — Other Ambulatory Visit: Payer: Self-pay

## 2021-03-14 DIAGNOSIS — I10 Essential (primary) hypertension: Secondary | ICD-10-CM

## 2021-03-14 DIAGNOSIS — E1169 Type 2 diabetes mellitus with other specified complication: Secondary | ICD-10-CM

## 2021-03-14 DIAGNOSIS — Z0000 Encounter for general adult medical examination without abnormal findings: Secondary | ICD-10-CM

## 2021-03-14 DIAGNOSIS — E440 Moderate protein-calorie malnutrition: Secondary | ICD-10-CM

## 2021-03-14 DIAGNOSIS — E039 Hypothyroidism, unspecified: Secondary | ICD-10-CM

## 2021-03-15 ENCOUNTER — Other Ambulatory Visit: Payer: Self-pay

## 2021-03-15 ENCOUNTER — Other Ambulatory Visit: Payer: Medicare HMO

## 2021-03-15 DIAGNOSIS — E039 Hypothyroidism, unspecified: Secondary | ICD-10-CM | POA: Diagnosis not present

## 2021-03-15 DIAGNOSIS — I10 Essential (primary) hypertension: Secondary | ICD-10-CM | POA: Diagnosis not present

## 2021-03-15 DIAGNOSIS — E1169 Type 2 diabetes mellitus with other specified complication: Secondary | ICD-10-CM | POA: Diagnosis not present

## 2021-03-15 DIAGNOSIS — Z0000 Encounter for general adult medical examination without abnormal findings: Secondary | ICD-10-CM | POA: Diagnosis not present

## 2021-03-15 DIAGNOSIS — E440 Moderate protein-calorie malnutrition: Secondary | ICD-10-CM | POA: Diagnosis not present

## 2021-03-16 LAB — COMPLETE METABOLIC PANEL WITH GFR
AG Ratio: 2 (calc) (ref 1.0–2.5)
ALT: 17 U/L (ref 6–29)
AST: 22 U/L (ref 10–35)
Albumin: 4.7 g/dL (ref 3.6–5.1)
Alkaline phosphatase (APISO): 71 U/L (ref 37–153)
BUN: 10 mg/dL (ref 7–25)
CO2: 29 mmol/L (ref 20–32)
Calcium: 10.6 mg/dL — ABNORMAL HIGH (ref 8.6–10.4)
Chloride: 99 mmol/L (ref 98–110)
Creat: 0.64 mg/dL (ref 0.60–0.95)
Globulin: 2.4 g/dL (calc) (ref 1.9–3.7)
Glucose, Bld: 147 mg/dL — ABNORMAL HIGH (ref 65–99)
Potassium: 5.8 mmol/L — ABNORMAL HIGH (ref 3.5–5.3)
Sodium: 134 mmol/L — ABNORMAL LOW (ref 135–146)
Total Bilirubin: 0.5 mg/dL (ref 0.2–1.2)
Total Protein: 7.1 g/dL (ref 6.1–8.1)
eGFR: 88 mL/min/{1.73_m2} (ref >=60)

## 2021-03-16 LAB — CBC WITH DIFFERENTIAL/PLATELET
Absolute Monocytes: 485 cells/uL (ref 200–950)
Basophils Absolute: 41 cells/uL (ref 0–200)
Basophils Relative: 0.8 %
Eosinophils Absolute: 71 cells/uL (ref 15–500)
Eosinophils Relative: 1.4 %
HCT: 41.6 % (ref 35.0–45.0)
Hemoglobin: 13.9 g/dL (ref 11.7–15.5)
Lymphs Abs: 2066 cells/uL (ref 850–3900)
MCH: 31.2 pg (ref 27.0–33.0)
MCHC: 33.4 g/dL (ref 32.0–36.0)
MCV: 93.3 fL (ref 80.0–100.0)
MPV: 10.1 fL (ref 7.5–12.5)
Monocytes Relative: 9.5 %
Neutro Abs: 2438 cells/uL (ref 1500–7800)
Neutrophils Relative %: 47.8 %
Platelets: 278 10*3/uL (ref 140–400)
RBC: 4.46 10*6/uL (ref 3.80–5.10)
RDW: 11.8 % (ref 11.0–15.0)
Total Lymphocyte: 40.5 %
WBC: 5.1 10*3/uL (ref 3.8–10.8)

## 2021-03-16 LAB — LIPID PANEL
Cholesterol: 146 mg/dL (ref <200)
HDL: 76 mg/dL (ref >=50)
LDL Cholesterol (Calc): 56 mg/dL (calc)
Non-HDL Cholesterol (Calc): 70 mg/dL (calc) (ref <130)
Total CHOL/HDL Ratio: 1.9 (calc) (ref <5.0)
Triglycerides: 62 mg/dL (ref <150)

## 2021-03-16 LAB — HEMOGLOBIN A1C
Hgb A1c MFr Bld: 7.2 % of total Hgb — ABNORMAL HIGH (ref <5.7)
Mean Plasma Glucose: 160 mg/dL
eAG (mmol/L): 8.9 mmol/L

## 2021-03-16 LAB — TSH: TSH: 2.1 mIU/L (ref 0.40–4.50)

## 2021-03-16 LAB — T4, FREE: Free T4: 1.4 ng/dL (ref 0.8–1.8)

## 2021-03-22 ENCOUNTER — Encounter: Payer: Self-pay | Admitting: Family Medicine

## 2021-03-22 ENCOUNTER — Other Ambulatory Visit: Payer: Self-pay | Admitting: Family Medicine

## 2021-03-22 ENCOUNTER — Ambulatory Visit (INDEPENDENT_AMBULATORY_CARE_PROVIDER_SITE_OTHER): Payer: Medicare HMO | Admitting: Family Medicine

## 2021-03-22 ENCOUNTER — Other Ambulatory Visit: Payer: Self-pay

## 2021-03-22 VITALS — BP 130/67 | HR 62 | Ht 61.0 in | Wt 90.8 lb

## 2021-03-22 DIAGNOSIS — E440 Moderate protein-calorie malnutrition: Secondary | ICD-10-CM | POA: Diagnosis not present

## 2021-03-22 DIAGNOSIS — E1169 Type 2 diabetes mellitus with other specified complication: Secondary | ICD-10-CM | POA: Diagnosis not present

## 2021-03-22 DIAGNOSIS — E039 Hypothyroidism, unspecified: Secondary | ICD-10-CM | POA: Diagnosis not present

## 2021-03-22 DIAGNOSIS — Z23 Encounter for immunization: Secondary | ICD-10-CM

## 2021-03-22 DIAGNOSIS — E875 Hyperkalemia: Secondary | ICD-10-CM

## 2021-03-22 DIAGNOSIS — I10 Essential (primary) hypertension: Secondary | ICD-10-CM

## 2021-03-22 DIAGNOSIS — Z0000 Encounter for general adult medical examination without abnormal findings: Secondary | ICD-10-CM | POA: Diagnosis not present

## 2021-03-22 NOTE — Assessment & Plan Note (Signed)
Stable, controlled hypothyroidism Last TSH normal Continue Levothyroxine 42mcg daily

## 2021-03-22 NOTE — Patient Instructions (Addendum)
Thank you for coming to the office today.  Keep up the great work  NON Fasting lab 1 week for potassium  Limit excess potassium in foods.  Can stop the 1 vitamin, keep the other two fruit/veggie combo.  Continue other meds. Refills when need from pharmacy have them contact us or you can reach out.   Please schedule a Follow-up Appointment to: Return in about 1 week (around 03/29/2021) for 1 week non fasting lab only Potassium then can do 6 month follow-up DM A1c.  If you have any other questions or concerns, please feel free to call the office or send a message through [place]. You may also schedule an earlier appointment if necessary.  Additionally, you may be receiving a survey about your experience at our office within a few days to 1 week by e-mail or mail. We value your feedback.  Nobie Putnam, DO [place]

## 2021-03-22 NOTE — Assessment & Plan Note (Signed)
Well-controlled HTN - Home BP readings reviewed, normal, has cuff  No known complications    Plan:  1.  Continue current BP regimen Valsartan '40mg'$  daily 2. Encourage improved lifestyle - low sodium diet, regular exercise 3. Continue monitor BP outside office, bring readings to next visit, if persistently >140/90 or new symptoms notify office sooner

## 2021-03-22 NOTE — Progress Notes (Signed)
Subjective:    Patient ID: Barbara Santos, female    DOB: 03/24/1936, 85 y.o.   MRN: [id number]  Barbara Santos is a 85 y.o. female presenting on 03/22/2021 for Annual Exam   HPI  Here for Annual Physical and Lab Review.  CHRONIC DM, Type 2: BMI >17 moderate protein calorie malnutrition - Today reports doing well on medicine overall CBGs: Avg 100 to 140s fasting AM, variety of other readings before and 2+ hours after meals, overall some 215 readings and below, most 150-170 detailed CBG log with her. 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 Tolerating well, w/o complaints. Lifestyle: - Diet: Limited sodium diet now, avoid salt added. Limited meats Admits mild HA in AM self limited Denies CP, dyspnea, edema, dizziness / lightheadedness  HYPERLIPIDEMIA: - Reports no concerns. Last lipid panel 02/2021, controlled  - Currently taking Rosuvastatin 5mg , tolerating well without side effects or myalgias  Health Maintenance:  COVID booster updated in 2021. Has not had the latest updated booster.  Due for Flu today   Depression screen [facility] 2/9 01/11/2021 03/17/2020 01/06/2020  Decreased Interest 0 0 0  Down, Depressed, Hopeless 0 0 0  PHQ - 2 Score 0 0 0  Altered sleeping - - -  Tired, decreased energy - - -  Change in appetite - - -  Feeling bad or failure about yourself  - - -  Trouble concentrating - - -   Moving slowly or fidgety/restless - - -  Suicidal thoughts - - -  PHQ-9 Score - - -  Difficult doing work/chores - - -    Past Medical History:  Diagnosis Date  . Arthritis   . Basal cell carcinoma 08/23/2009   Right dorsum lat. distal forearm.   . Diabetes mellitus, type 2 ([place])   . Dysplastic nevus 08/03/2008   Right ant. thigh, just above knee. Moderate to marked atypia, extends to base. Excised 09/23/2008, margins free.  Marland Kitchen Dysplastic nevus 08/23/2009   Right post. sup. thigh. MIld atypia, limited margins free.  . Glaucoma   . Hx of basal cell carcinoma    R cheek  . Hx of squamous cell carcinoma of skin 11/01/2009   L medial mid calf  . Hyperlipidemia   . Hypothyroidism   . Insomnia   . Left hip pain   . Protein calorie malnutrition ([place])   . Squamous cell carcinoma of skin 08/23/2009   Left medial mid calf. SCCis   Past Surgical History:  Procedure Laterality Date  . CATARACT EXTRACTION W/PHACO Left 12/31/2019   Procedure: CATARACT EXTRACTION PHACO AND INTRAOCULAR LENS PLACEMENT (IOC) LEFT DIABETIC 10.20  01:17.6  13.1%;  Surgeon: Leandrew Koyanagi, MD;  Location: [place];  Service: Ophthalmology;  Laterality: Left;  Diabetic - oral meds  . CATARACT EXTRACTION W/PHACO Right 01/28/2020   Procedure: CATARACT EXTRACTION PHACO AND INTRAOCULAR LENS PLACEMENT (IOC) RIGHT DIABETIC 13.67 01:34.5 14.5%;  Surgeon: Wallace Going,  Nila Nephew, MD;  Location: [place];  Service: Ophthalmology;  Laterality: Right;  . COLONOSCOPY WITH PROPOFOL N/A 12/31/2017   Procedure: COLONOSCOPY WITH PROPOFOL;  Surgeon: Manya Silvas, MD;  Location: [facility] ENDOSCOPY;  Service: Endoscopy;  Laterality: N/A;  . HERNIA REPAIR    . TOTAL ABDOMINAL HYSTERECTOMY  2009   Social History   Socioeconomic History  . Marital status: Widowed    Spouse name: Not on file  . Number of children: Not on file  . Years of education: Not on file  . Highest education level: High school graduate   Occupational History  . Not on file  Tobacco Use  . Smoking status: Never  . Smokeless tobacco: Never  Vaping Use  . Vaping Use: Never used  Substance and Sexual Activity  . Alcohol use: No    Alcohol/week: 0.0 standard drinks  . Drug use: No  . Sexual activity: Not on file  Other Topics Concern  . Not on file  Social History Narrative  . Not on file   Social Determinants of Health   Financial Resource Strain: Low Risk   . Difficulty of Paying Living Expenses: Not hard at all  Food Insecurity: No Food Insecurity  . Worried About Charity fundraiser in the Last Year: Never true  . Ran Out of Food in the Last Year: Never true  Transportation Needs: No Transportation Needs  . Lack of Transportation (Medical): No  . Lack of Transportation (Non-Medical): No  Physical Activity: Inactive  . Days of Exercise per Week: 0 days  . Minutes of Exercise per Session: 0 min  Stress: No Stress Concern Present  . Feeling of Stress : Not at all  Social Connections: Not on file  Intimate Partner Violence: Not on file   Family History  Problem Relation Age of Onset  . Diabetes Mellitus II Mother   . Stroke Father   . Stroke Maternal Grandmother   . Heart disease Paternal Grandfather   . Breast cancer Sister 92   Current Outpatient Medications on File Prior to Visit  Medication Sig  . Cholecalciferol (VITAMIN D) 2000 UNITS tablet Take 2,000 Units by mouth daily.  . diphenhydrAMINE (BENADRYL) 25 MG tablet Take 25 mg by mouth every 6 (six) hours as needed.  . dorzolamide-timolol (COSOPT) 22.3-6.8 MG/ML ophthalmic solution Place 1 drop into both eyes 2 (two) times daily.   Marland Kitchen lactose free nutrition (BOOST) LIQD Take 237 mLs by mouth 3 (three) times daily between meals.  Marland Kitchen levothyroxine (SYNTHROID) 50 MCG tablet Take 1 tablet (50 mcg total) by mouth daily before breakfast.  . metFORMIN (GLUCOPHAGE-XR) 500 MG 24 hr tablet Take 1 tablet (500 mg total) by mouth 2 (two) times daily with a meal.   . Multiple Vitamin (MULTIVITAMIN) tablet Take 1 tablet by mouth daily.  . Nutritional Supplements (JUICE PLUS FIBRE PO) Take by mouth.  Glory Rosebush DELICA LANCETS FINE MISC 1 each by Other route 3 (three) times daily.  Glory Rosebush VERIO test strip CHECK BLOOD SUGAR UP TO ONCE DAILY  . rosuvastatin (CRESTOR) 5 MG tablet TAKE 1 TABLET AT BEDTIME  . valsartan (DIOVAN) 40 MG tablet Take 1 tablet (40 mg total) by mouth daily. May start with half tablet for first 2 weeks.   No current facility-administered medications on file prior to visit.    Review of Systems  Constitutional:  Negative for activity change, appetite change, chills, diaphoresis, fatigue and fever.  HENT:  Negative for congestion and hearing loss.  Eyes:  Negative for visual disturbance.  Respiratory:  Negative for cough, chest tightness, shortness of breath and wheezing.   Cardiovascular:  Negative for chest pain, palpitations and leg swelling.  Gastrointestinal:  Negative for abdominal pain, constipation, diarrhea, nausea and vomiting.  Genitourinary:  Negative for dysuria, frequency and hematuria.  Musculoskeletal:  Negative for arthralgias and neck pain.  Skin:  Negative for rash.  Neurological:  Negative for dizziness, weakness, light-headedness, numbness and headaches.  Hematological:  Negative for adenopathy.  Psychiatric/Behavioral:  Negative for behavioral problems, dysphoric mood and sleep disturbance.   Per HPI unless specifically indicated above      Objective:    BP 130/67 (BP Location: Left Arm, Cuff Size: Normal)   Pulse 62   Ht 5\' 1"  (1.549 m)   Wt 90 lb 12.8 oz (41.2 kg)   SpO2 100%   BMI 17.16 kg/m   Wt Readings from Last 3 Encounters:  03/22/21 90 lb 12.8 oz (41.2 kg)  01/11/21 89 lb (40.4 kg)  10/26/20 89 lb 9.6 oz (40.6 kg)    Physical Exam Vitals and nursing note reviewed.  Constitutional:      General: She is not in acute distress.    Appearance: She is well-developed. She is not  diaphoretic.     Comments: Well-appearing, comfortable, cooperative  HENT:     Head: Normocephalic and atraumatic.  Eyes:     General:        Right eye: No discharge.        Left eye: No discharge.     Conjunctiva/sclera: Conjunctivae normal.     Pupils: Pupils are equal, round, and reactive to light.  Neck:     Thyroid: No thyromegaly.  Cardiovascular:     Rate and Rhythm: Normal rate and regular rhythm.     Pulses: Normal pulses.     Heart sounds: Normal heart sounds. No murmur heard. Pulmonary:     Effort: Pulmonary effort is normal. No respiratory distress.     Breath sounds: Normal breath sounds. No wheezing or rales.  Abdominal:     General: Bowel sounds are normal. There is no distension.     Palpations: Abdomen is soft. There is no mass.     Tenderness: There is no abdominal tenderness.  Musculoskeletal:        General: No tenderness. Normal range of motion.     Cervical back: Normal range of motion and neck supple.     Comments: Upper / Lower Extremities: - Normal muscle tone, strength bilateral upper extremities 5/5, lower extremities 5/5  Lymphadenopathy:     Cervical: No cervical adenopathy.  Skin:    General: Skin is warm and dry.     Findings: No erythema or rash.  Neurological:     Mental Status: She is alert and oriented to person, place, and time.     Comments: Distal sensation intact to light touch all extremities  Psychiatric:        Mood and Affect: Mood normal.        Behavior: Behavior normal.        Thought Content: Thought content normal.     Comments: Well groomed, good eye contact, normal speech and thoughts    Diabetic Foot Exam - Simple   Simple Foot Form Diabetic Foot exam was performed with the following findings: Yes 03/22/2021 10:33 AM  Visual Inspection See comments: Yes Sensation Testing Intact to touch and monofilament testing bilaterally: Yes Pulse Check Posterior Tibialis and Dorsalis pulse intact  bilaterally:  Yes Comments Thickened toenails great toe onychomycosis. No ulceration or erythema. Intact monofilament.      Results for orders placed or performed in visit on 03/22/21  HM DIABETES EYE EXAM  Result Value Ref Range   HM Diabetic Eye Exam No Retinopathy No Retinopathy      Assessment & Plan:   Problem List Items Addressed This Visit     Type 2 diabetes mellitus with other specified complication (HCC)    Stable H2C up to 7.2 Complicated by hypothyroidism and hyperlipidemia Last urine microalbumin negative 2021  Plan:  Continue Metformin XR 500 BID Encourage improved lifestyle -and acceptable to allow A1c / CBGs to increase to 7+ range Check CBG, bring log to next visit for review Continue ARB Continue Statin      Protein calorie malnutrition (HCC)    Weight improved 1 lb On improved nutrition, Ensure      Hypothyroidism    Stable, controlled hypothyroidism Last TSH normal Continue Levothyroxine 60mcg daily      Essential hypertension    Well-controlled HTN - Home BP readings reviewed, normal, has cuff  No known complications    Plan:  1.  Continue current BP regimen Valsartan 40mg  daily 2. Encourage improved lifestyle - low sodium diet, regular exercise 3. Continue monitor BP outside office, bring readings to next visit, if persistently >140/90 or new symptoms notify office sooner      Other Visit Diagnoses     Annual physical exam    -  Primary   Needs flu shot       Relevant Orders   Flu Vaccine QUAD High Dose(Fluad) (Completed)   Hyperkalemia           Updated Health Maintenance information Flu shot today Considering Martin booster Reviewed recent lab results with patient Encouraged improvement to lifestyle with diet and exercise Goal of weight loss  #Hyperkalemia Lab BMET 1 week for K. Will consider holding ARB and changing if indicated. Adjusting diet vs ensure.  No orders of the defined types were placed in this encounter.     Follow  up plan: Return in about 1 week (around 03/29/2021) for 1 week non fasting lab only Potassium then can do 6 month follow-up DM A1c.  Nobie Putnam, [facility] 03/22/2021, 10:18 AM

## 2021-03-22 NOTE — Assessment & Plan Note (Signed)
Weight improved 1 lb On improved nutrition, Ensure

## 2021-03-22 NOTE — Assessment & Plan Note (Signed)
Stable V2Z up to 7.2 Complicated by hypothyroidism and hyperlipidemia Last urine microalbumin negative 2021  Plan:  Continue Metformin XR 500 BID Encourage improved lifestyle -and acceptable to allow A1c / CBGs to increase to 7+ range Check CBG, bring log to next visit for review Continue ARB Continue Statin

## 2021-03-25 ENCOUNTER — Other Ambulatory Visit: Payer: Self-pay

## 2021-03-25 DIAGNOSIS — E875 Hyperkalemia: Secondary | ICD-10-CM

## 2021-03-28 ENCOUNTER — Ambulatory Visit: Payer: Medicare HMO | Admitting: Dermatology

## 2021-03-28 ENCOUNTER — Other Ambulatory Visit: Payer: Self-pay

## 2021-03-28 DIAGNOSIS — Z86018 Personal history of other benign neoplasm: Secondary | ICD-10-CM

## 2021-03-28 DIAGNOSIS — Q825 Congenital non-neoplastic nevus: Secondary | ICD-10-CM

## 2021-03-28 DIAGNOSIS — Z1283 Encounter for screening for malignant neoplasm of skin: Secondary | ICD-10-CM

## 2021-03-28 DIAGNOSIS — L820 Inflamed seborrheic keratosis: Secondary | ICD-10-CM

## 2021-03-28 DIAGNOSIS — L578 Other skin changes due to chronic exposure to nonionizing radiation: Secondary | ICD-10-CM

## 2021-03-28 DIAGNOSIS — B353 Tinea pedis: Secondary | ICD-10-CM | POA: Diagnosis not present

## 2021-03-28 DIAGNOSIS — Z85828 Personal history of other malignant neoplasm of skin: Secondary | ICD-10-CM | POA: Diagnosis not present

## 2021-03-28 DIAGNOSIS — L570 Actinic keratosis: Secondary | ICD-10-CM | POA: Diagnosis not present

## 2021-03-28 MED ORDER — KETOCONAZOLE 2 % EX CREA: 1.0000 "application " | TOPICAL_CREAM | Freq: Every day | CUTANEOUS | 11 refills | Status: DC

## 2021-03-28 NOTE — Patient Instructions (Signed)

## 2021-03-28 NOTE — Progress Notes (Signed)
Follow-Up Visit   Subjective  Barbara Santos is a 85 y.o. female who presents for the following: Annual Exam (History of BCC, SCC, Dysplastic nevi - TBSE today. The patient has spots, moles and lesions to be evaluated, some may be new or changing and the patient has concerns that these could be cancer. ).  The following portions of the chart were reviewed this encounter and updated as appropriate:   Tobacco  Allergies  Meds  Problems  Med Hx  Surg Hx  Fam Hx     Review of Systems:  No other skin or systemic complaints except as noted in HPI or Assessment and Plan.  Objective  Well appearing patient in no apparent distress; mood and affect are within normal limits.  A full examination was performed including scalp, head, eyes, ears, nose, lips, neck, chest, axillae, abdomen, back, buttocks, bilateral upper extremities, bilateral lower extremities, hands, feet, fingers, toes, fingernails, and toenails. All findings within normal limits unless otherwise noted below.  Scale and nail dystrophy  Right Forehead x 1, scalp x 1, face x 2 (4) Erythematous keratotic or waxy stuck-on papule or plaque.   Face (15) Erythematous thin papules/macules with gritty scale.   Left sup buttock        Assessment & Plan   History of Basal Cell Carcinoma of the Skin - No evidence of recurrence today - Recommend regular full body skin exams - Recommend daily broad spectrum sunscreen SPF 30+ to sun-exposed areas, reapply every 2 hours as needed.  - Call if any new or changing lesions are noted between office visits  History of Squamous Cell Carcinoma of the Skin - No evidence of recurrence today - No lymphadenopathy - Recommend regular full body skin exams - Recommend daily broad spectrum sunscreen SPF 30+ to sun-exposed areas, reapply every 2 hours as needed.  - Call if any new or changing lesions are noted between office visits  History of Dysplastic Nevi - No evidence of  recurrence today - Recommend regular full body skin exams - Recommend daily broad spectrum sunscreen SPF 30+ to sun-exposed areas, reapply every 2 hours as needed.  - Call if any new or changing lesions are noted between office visits  Inflamed seborrheic keratosis Right Forehead x 1, scalp x 1, face x  Destruction of lesion - Right Forehead x 1, scalp x 1, face x 2 Complexity: simple   Destruction method: cryotherapy   Informed consent: discussed and consent obtained   Timeout:  patient name, date of birth, surgical site, and procedure verified Lesion destroyed using liquid nitrogen: Yes   Region frozen until ice ball extended beyond lesion: Yes   Outcome: patient tolerated procedure well with no complications   Post-procedure details: wound care instructions given    AK (actinic keratosis) (15) Face Destruction of lesion - Face Complexity: simple   Destruction method: cryotherapy   Informed consent: discussed and consent obtained   Timeout:  patient name, date of birth, surgical site, and procedure verified Lesion destroyed using liquid nitrogen: Yes   Region frozen until ice ball extended beyond lesion: Yes   Outcome: patient tolerated procedure well with no complications   Post-procedure details: wound care instructions given    Tinea pedis of both feet Chronic and persistent  Start Ketoconazole 2% cream qhs  ketoconazole (NIZORAL) 2 % cream Apply 1 application topically at bedtime. Apply to feet  Congenital non-neoplastic nevus Left sup buttock Benign appearing, observe.  Skin cancer screening  Actinic Damage -  chronic, secondary to cumulative UV radiation exposure/sun exposure over time - diffuse scaly erythematous macules with underlying dyspigmentation - Recommend daily broad spectrum sunscreen SPF 30+ to sun-exposed areas, reapply every 2 hours as needed.  - Recommend staying in the shade or wearing long sleeves, sun glasses (UVA+UVB protection) and wide brim  hats (4-inch brim around the entire circumference of the hat). - Call for new or changing lesions.  Return in about 3 months (around 06/26/2021) for AK follow up.  I, Ashok Cordia, CMA, am acting as scribe for Sarina Ser, MD . Documentation: I have reviewed the above documentation for accuracy and completeness, and I agree with the above.  Sarina Ser, MD

## 2021-03-29 ENCOUNTER — Other Ambulatory Visit: Payer: Medicare HMO

## 2021-03-29 DIAGNOSIS — E875 Hyperkalemia: Secondary | ICD-10-CM | POA: Diagnosis not present

## 2021-03-29 LAB — BASIC METABOLIC PANEL WITH GFR
BUN/Creatinine Ratio: 19 (calc) (ref 6–22)
BUN: 11 mg/dL (ref 7–25)
CO2: 29 mmol/L (ref 20–32)
Calcium: 9.9 mg/dL (ref 8.6–10.4)
Chloride: 96 mmol/L — ABNORMAL LOW (ref 98–110)
Creat: 0.58 mg/dL — ABNORMAL LOW (ref 0.60–0.95)
Glucose, Bld: 155 mg/dL — ABNORMAL HIGH (ref 65–99)
Potassium: 4.1 mmol/L (ref 3.5–5.3)
Sodium: 132 mmol/L — ABNORMAL LOW (ref 135–146)
eGFR: 90 mL/min/{1.73_m2} (ref >=60)

## 2021-04-06 ENCOUNTER — Encounter: Payer: Self-pay | Admitting: Dermatology

## 2021-04-13 ENCOUNTER — Other Ambulatory Visit: Payer: Self-pay | Admitting: Family Medicine

## 2021-04-13 DIAGNOSIS — E1169 Type 2 diabetes mellitus with other specified complication: Secondary | ICD-10-CM

## 2021-04-13 NOTE — Telephone Encounter (Signed)
Requested Prescriptions  Pending Prescriptions Disp Refills   metFORMIN (GLUCOPHAGE-XR) 500 MG 24 hr tablet [Pharmacy Med Name: METFORMIN ER TAB 500MG GP] 180 tablet 3    Sig: TAKE 1 TABLET TWICE A DAY  WITH MEALS     Endocrinology:  Diabetes - Biguanides Failed - 04/13/2021  8:41 AM      Failed - Cr in normal range and within 360 days    Creat  Date Value Ref Range Status  03/29/2021 0.58 (L) 0.60 - 0.95 mg/dL Final         Passed - HBA1C is between 0 and 7.9 and within 180 days    Hgb A1c MFr Bld  Date Value Ref Range Status  03/15/2021 7.2 (H) <5.7 % of total Hgb Final    Comment:    For someone without known diabetes, a hemoglobin A1c value of 6.5% or greater indicates that they may have  diabetes and this should be confirmed with a follow-up  test. . For someone with known diabetes, a value <7% indicates  that their diabetes is well controlled and a value  greater than or equal to 7% indicates suboptimal  control. A1c targets should be individualized based on  duration of diabetes, age, comorbid conditions, and  other considerations. . Currently, no consensus exists regarding use of hemoglobin A1c for diagnosis of diabetes for children. .          Passed - eGFR in normal range and within 360 days    GFR, Est African American  Date Value Ref Range Status  10/19/2020 98 > OR = 60 mL/min/1.48m Final   GFR, Est Non African American  Date Value Ref Range Status  10/19/2020 84 > OR = 60 mL/min/1.735mFinal   eGFR  Date Value Ref Range Status  03/29/2021 90 > OR = 60 mL/min/1.732minal    Comment:    The eGFR is based on the CKD-EPI 2021 equation. To calculate  the new eGFR from a previous Creatinine or Cystatin C result, go to https://www.kidney.org/professionals/ kdoqi/gfr%5Fcalculator          Passed - Valid encounter within last 6 months    Recent Outpatient Visits          3 weeks ago Annual physical exam   [facility]   5 months ago Type 2 diabetes mellitus with other specified complication, without long-term current use of insulin ([facility]   6 months ago Type 2 diabetes mellitus with other specified complication, without long-term current use of insulin [facility]   1 year ago Annual physical exam   [facility]   1 year ago Acute cystitis with hematuria   [facility]      Future Appointments            In 2 months [facility] [facility]  [facility]

## 2021-04-29 ENCOUNTER — Other Ambulatory Visit: Payer: Self-pay | Admitting: Family Medicine

## 2021-04-29 DIAGNOSIS — E039 Hypothyroidism, unspecified: Secondary | ICD-10-CM

## 2021-04-29 NOTE — Telephone Encounter (Signed)
Requested Prescriptions  Pending Prescriptions Disp Refills   levothyroxine (SYNTHROID) 50 MCG tablet [Pharmacy Med Name: LEVOTHYROXIN TAB 50MCG] 90 tablet 3    Sig: TAKE 1 TABLET DAILY BEFORE BREAKFAST     Endocrinology:  Hypothyroid Agents Failed - 04/29/2021  2:13 AM      Failed - TSH needs to be rechecked within 3 months after an abnormal result. Refill until TSH is due.      Passed - TSH in normal range and within 360 days    TSH  Date Value Ref Range Status  03/15/2021 2.10 0.40 - 4.50 mIU/L Final         Passed - Valid encounter within last 12 months    Recent Outpatient Visits          1 month ago Annual physical exam   [facility] [place], Devonne Doughty, DO   6 months ago Type 2 diabetes mellitus with other specified complication, without long-term current use of insulin ([place])   [place], DO   7 months ago Type 2 diabetes mellitus with other specified complication, without long-term current use of insulin [facility])   [facility] [place], Devonne Doughty, DO   1 year ago Annual physical exam   [facility] Olin Hauser, DO   1 year ago Acute cystitis with hematuria   [place], DO      Future Appointments            In 1 month Nehemiah Massed Monia Sabal, MD [place]   In 5 months Parks Ranger, [facility], [place]   In 8 months  [facility], [place]

## 2021-05-05 ENCOUNTER — Other Ambulatory Visit: Payer: Self-pay | Admitting: Family Medicine

## 2021-05-05 DIAGNOSIS — E7800 Pure hypercholesterolemia, unspecified: Secondary | ICD-10-CM

## 2021-05-05 DIAGNOSIS — E039 Hypothyroidism, unspecified: Secondary | ICD-10-CM

## 2021-05-05 MED ORDER — ROSUVASTATIN CALCIUM 5 MG PO TABS: 5.0000 mg | ORAL_TABLET | Freq: Every day | ORAL | 0 refills | Status: DC

## 2021-05-05 MED ORDER — LEVOTHYROXINE SODIUM 50 MCG PO TABS: 50.0000 ug | ORAL_TABLET | Freq: Every day | ORAL | 3 refills | Status: DC

## 2021-05-05 NOTE — Telephone Encounter (Signed)
Medication Refill - Medication:  rosuvastatin (CRESTOR) 5 MG tablet levothyroxine (SYNTHROID) 50 MCG tablet   Has the patient contacted their pharmacy? Yes.   Could not get through to the pharmacy  Preferred Pharmacy (with phone number or street name):  [facility], [place] to [facility]  Phone:  [phone] Fax:  [phone]  Has the patient been seen for an appointment in the last year OR does the patient have an upcoming appointment? Yes.    Agent: Please be advised that RX refills may take up to 3 business days. We ask that you follow-up with your pharmacy.

## 2021-05-05 NOTE — Telephone Encounter (Signed)
Requested Prescriptions  Pending Prescriptions Disp Refills   levothyroxine (SYNTHROID) 50 MCG tablet 90 tablet 3    Sig: Take 1 tablet (50 mcg total) by mouth daily before breakfast.     Endocrinology:  Hypothyroid Agents Failed - 05/05/2021  9:40 AM      Failed - TSH needs to be rechecked within 3 months after an abnormal result. Refill until TSH is due.      Passed - TSH in normal range and within 360 days    TSH  Date Value Ref Range Status  03/15/2021 2.10 0.40 - 4.50 mIU/L Final         Passed - Valid encounter within last 12 months    Recent Outpatient Visits          1 month ago Annual physical exam   [facility] [place], Devonne Doughty, DO   6 months ago Type 2 diabetes mellitus with other specified complication, without long-term current use of insulin ([place])   [facility] [place], Devonne Doughty, DO   7 months ago Type 2 diabetes mellitus with other specified complication, without long-term current use of insulin ([place])   [facility] [place], Devonne Doughty, DO   1 year ago Annual physical exam   [facility] Olin Hauser, DO   1 year ago Acute cystitis with hematuria   [place], DO      Future Appointments            In 1 month Ralene Bathe, MD [place]   In 4 months [place], DO [facility], [place]   In 8 months  [facility], [facility]            rosuvastatin (CRESTOR) 5 MG tablet 90 tablet 0    Sig: Take 1 tablet (5 mg total) by mouth at bedtime.     Cardiovascular:  Antilipid - Statins Passed - 05/05/2021  9:40 AM      Passed - Total Cholesterol in normal range and within 360 days    Cholesterol, Total  Date Value Ref Range Status  01/12/2015 176 100 - 199 mg/dL Final   Cholesterol  Date Value Ref Range Status  03/15/2021 146 <200 mg/dL Final         Passed - LDL in normal  range and within 360 days    LDL Cholesterol (Calc)  Date Value Ref Range Status  03/15/2021 56 mg/dL (calc) Final    Comment:    Reference range: <100 . Desirable range <100 mg/dL for primary prevention;   <70 mg/dL for patients with CHD or diabetic patients  with > or = 2 CHD risk factors. Marland Kitchen LDL-C is now calculated using the Martin-Hopkins  calculation, which is a validated novel method providing  better accuracy than the Friedewald equation in the  estimation of LDL-C.  Cresenciano Genre et al. Annamaria Helling. 3149;702(63): 2061-2068  (http://education.QuestDiagnostics.com/faq/FAQ164)          Passed - HDL in normal range and within 360 days    HDL  Date Value Ref Range Status  03/15/2021 76 > OR = 50 mg/dL Final  01/12/2015 54 >39 mg/dL Final    Comment:    According to ATP-III Guidelines, HDL-C >59 mg/dL is considered a negative risk factor for CHD.          Passed - Triglycerides in normal range and within 360 days  Triglycerides  Date Value Ref Range Status  03/15/2021 62 <150 mg/dL Final         Passed - Patient is not pregnant      Passed - Valid encounter within last 12 months    Recent Outpatient Visits          1 month ago Annual physical exam   [facility] [place], Devonne Doughty, DO   6 months ago Type 2 diabetes mellitus with other specified complication, without long-term current use of insulin ([place])   [place], DO   7 months ago Type 2 diabetes mellitus with other specified complication, without long-term current use of insulin [facility])   [facility] Olin Hauser, DO   1 year ago Annual physical exam   [facility] Olin Hauser, DO   1 year ago Acute cystitis with hematuria   [facility] Olin Hauser, DO      Future Appointments            In 1 month Nehemiah Massed Monia Sabal, MD [place]   In 4 months [facility], [place]   In 8 months  [facility], [place]

## 2021-05-27 DIAGNOSIS — Z008 Encounter for other general examination: Secondary | ICD-10-CM | POA: Diagnosis not present

## 2021-05-27 DIAGNOSIS — Z7984 Long term (current) use of oral hypoglycemic drugs: Secondary | ICD-10-CM | POA: Diagnosis not present

## 2021-05-27 DIAGNOSIS — H409 Unspecified glaucoma: Secondary | ICD-10-CM | POA: Diagnosis not present

## 2021-05-27 DIAGNOSIS — R32 Unspecified urinary incontinence: Secondary | ICD-10-CM | POA: Diagnosis not present

## 2021-05-27 DIAGNOSIS — E1151 Type 2 diabetes mellitus with diabetic peripheral angiopathy without gangrene: Secondary | ICD-10-CM | POA: Diagnosis not present

## 2021-05-27 DIAGNOSIS — E785 Hyperlipidemia, unspecified: Secondary | ICD-10-CM | POA: Diagnosis not present

## 2021-05-27 DIAGNOSIS — I10 Essential (primary) hypertension: Secondary | ICD-10-CM | POA: Diagnosis not present

## 2021-05-27 DIAGNOSIS — E039 Hypothyroidism, unspecified: Secondary | ICD-10-CM | POA: Diagnosis not present

## 2021-05-27 DIAGNOSIS — Z681 Body mass index (BMI) 19 or less, adult: Secondary | ICD-10-CM | POA: Diagnosis not present

## 2021-05-27 DIAGNOSIS — Z809 Family history of malignant neoplasm, unspecified: Secondary | ICD-10-CM | POA: Diagnosis not present

## 2021-05-27 DIAGNOSIS — E46 Unspecified protein-calorie malnutrition: Secondary | ICD-10-CM | POA: Diagnosis not present

## 2021-05-27 DIAGNOSIS — Z8249 Family history of ischemic heart disease and other diseases of the circulatory system: Secondary | ICD-10-CM | POA: Diagnosis not present

## 2021-05-27 DIAGNOSIS — Z803 Family history of malignant neoplasm of breast: Secondary | ICD-10-CM | POA: Diagnosis not present

## 2021-06-27 ENCOUNTER — Ambulatory Visit: Payer: Medicare HMO | Admitting: Dermatology

## 2021-06-27 ENCOUNTER — Encounter: Payer: Self-pay | Admitting: Dermatology

## 2021-06-27 ENCOUNTER — Other Ambulatory Visit: Payer: Self-pay

## 2021-06-27 DIAGNOSIS — L570 Actinic keratosis: Secondary | ICD-10-CM

## 2021-06-27 DIAGNOSIS — D492 Neoplasm of unspecified behavior of bone, soft tissue, and skin: Secondary | ICD-10-CM

## 2021-06-27 DIAGNOSIS — C44319 Basal cell carcinoma of skin of other parts of face: Secondary | ICD-10-CM

## 2021-06-27 DIAGNOSIS — L820 Inflamed seborrheic keratosis: Secondary | ICD-10-CM | POA: Diagnosis not present

## 2021-06-27 DIAGNOSIS — L578 Other skin changes due to chronic exposure to nonionizing radiation: Secondary | ICD-10-CM

## 2021-06-27 DIAGNOSIS — Z872 Personal history of diseases of the skin and subcutaneous tissue: Secondary | ICD-10-CM | POA: Diagnosis not present

## 2021-06-27 NOTE — Patient Instructions (Signed)
Wound Care Instructions ? ?Cleanse wound gently with soap and water once a day then pat dry with clean gauze. Apply a thing coat of Petrolatum (petroleum jelly, "Vaseline") over the wound (unless you have an allergy to this). We recommend that you use a new, sterile tube of Vaseline. Do not pick or remove scabs. Do not remove the yellow or white "healing tissue" from the base of the wound. ? ?Cover the wound with fresh, clean, nonstick gauze and secure with paper tape. You may use Band-Aids in place of gauze and tape if the would is small enough, but would recommend trimming much of the tape off as there is often too much. Sometimes Band-Aids can irritate the skin. ? ?You should call the office for your biopsy report after 1 week if you have not already been contacted. ? ?If you experience any problems, such as abnormal amounts of bleeding, swelling, significant bruising, significant pain, or evidence of infection, please call the office immediately. ? ?FOR ADULT SURGERY PATIENTS: If you need something for pain relief you may take 1 extra strength Tylenol (acetaminophen) AND 2 Ibuprofen (200mg each) together every 4 hours as needed for pain. (do not take these if you are allergic to them or if you have a reason you should not take them.) Typically, you may only need pain medication for 1 to 3 days.  ? ?Cryotherapy Aftercare ? ?Wash gently with soap and water everyday.   ?Apply Vaseline and Band-Aid daily until healed.  ? ?Prior to procedure, discussed risks of blister formation, small wound, skin dyspigmentation, or rare scar following cryotherapy. Recommend Vaseline ointment to treated areas while healing.  ? ?If You Need Anything After Your Visit ? ?If you have any questions or concerns for your doctor, please call our main line at [phone] and press option 4 to reach your doctor's medical assistant. If no one answers, please leave a voicemail as directed and we will return your call as soon as possible.  Messages left after 4 pm will be answered the following business day.  ? ?You may also send us a message via MyChart. We typically respond to MyChart messages within 1-2 business days. ? ?For prescription refills, please ask your pharmacy to contact our office. Our fax number is [phone]. ? ?If you have an urgent issue when the clinic is closed that cannot wait until the next business day, you can page your doctor at the number below.   ? ?Please note that while we do our best to be available for urgent issues outside of office hours, we are not available 24/7.  ? ?If you have an urgent issue and are unable to reach us, you may choose to seek medical care at your doctor's office, retail clinic, urgent care center, or emergency room. ? ?If you have a medical emergency, please immediately call 911 or go to the emergency department. ? ?Pager Numbers ? ?- Dr. Kowalski: [phone] ? ?- Dr. Moye: [phone] ? ?- Dr. Stewart: [phone] ? ?In the event of inclement weather, please call our main line at [phone] for an update on the status of any delays or closures. ? ?Dermatology Medication Tips: ?Please keep the boxes that topical medications come in in order to help keep track of the instructions about where and how to use these. Pharmacies typically print the medication instructions only on the boxes and not directly on the medication tubes.  ? ?If your medication is too expensive, please contact our office at [phone] option 4   or send us a message through MyChart.  ? ?We are unable to tell what your co-pay for medications will be in advance as this is different depending on your insurance coverage. However, we may be able to find a substitute medication at lower cost or fill out paperwork to get insurance to cover a needed medication.  ? ?If a prior authorization is required to get your medication covered by your insurance company, please allow us 1-2 business days to complete this process. ? ?Drug  prices often vary depending on where the prescription is filled and some pharmacies may offer cheaper prices. ? ?The website www.goodrx.com contains coupons for medications through different pharmacies. The prices here do not account for what the cost may be with help from insurance (it may be cheaper with your insurance), but the website can give you the price if you did not use any insurance.  ?- You can print the associated coupon and take it with your prescription to the pharmacy.  ?- You may also stop by our office during regular business hours and pick up a GoodRx coupon card.  ?- If you need your prescription sent electronically to a different pharmacy, notify our office through [place] MyChart or by phone at [phone] option 4. ? ? ? ? ?Si Usted Necesita Algo Despu?s de Su Visita ? ?Tambi?n puede enviarnos un mensaje a trav?s de MyChart. Por lo general respondemos a los mensajes de MyChart en el transcurso de 1 a 2 d?as h?biles. ? ?Para renovar recetas, por favor pida a su farmacia que se ponga en contacto con nuestra oficina. Nuestro n?mero de fax es el [phone]. ? ?Si tiene un asunto urgente cuando la cl?nica est? cerrada y que no puede esperar hasta el siguiente d?a h?bil, puede llamar/localizar a su doctor(a) al n?mero que aparece a continuaci?n.  ? ?Por favor, tenga en cuenta que aunque hacemos todo lo posible para estar disponibles para asuntos urgentes fuera del horario de oficina, no estamos disponibles las 24 horas del d?a, los 7 d?as de la semana.  ? ?Si tiene un problema urgente y no puede comunicarse con nosotros, puede optar por buscar atenci?n m?dica  en el consultorio de su doctor(a), en una cl?nica privada, en un centro de atenci?n urgente o en una sala de emergencias. ? ?Si tiene una emergencia m?dica, por favor llame inmediatamente al 911 o vaya a la sala de emergencias. ? ?N?meros de b?per ? ?- Dr. Kowalski: [phone] ? ?- Dra. Moye: [phone] ? ?- Dra. Stewart:  [phone] ? ?En caso de inclemencias del tiempo, por favor llame a nuestra l?nea principal al [phone] para una actualizaci?n sobre el estado de cualquier retraso o cierre. ? ?Consejos para la medicaci?n en dermatolog?a: ?Por favor, guarde las cajas en las que vienen los medicamentos de uso t?pico para ayudarle a seguir las instrucciones sobre d?nde y c?mo usarlos. Las farmacias generalmente imprimen las instrucciones del medicamento s?lo en las cajas y no directamente en los tubos del medicamento.  ? ?Si su medicamento es muy caro, por favor, p?ngase en contacto con nuestra oficina llamando al [phone] y presione la opci?n 4 o env?enos un mensaje a trav?s de MyChart.  ? ?No podemos decirle cu?l ser? su copago por los medicamentos por adelantado ya que esto es diferente dependiendo de la cobertura de su seguro. Sin embargo, es posible que podamos encontrar un medicamento sustituto a menor costo o llenar un formulario para que el seguro cubra el medicamento que se considera necesario.  ? ?  Si se requiere una autorizaci?n previa para que su compa??a de seguros cubra su medicamento, por favor perm?tanos de 1 a 2 d?as h?biles para completar este proceso. ? ?Los precios de los medicamentos var?an con frecuencia dependiendo del lugar de d?nde se surte la receta y alguna farmacias pueden ofrecer precios m?s baratos. ? ?El sitio web www.goodrx.com tiene cupones para medicamentos de diferentes farmacias. Los precios aqu? no tienen en cuenta lo que podr?a costar con la ayuda del seguro (puede ser m?s barato con su seguro), pero el sitio web puede darle el precio si no utiliz? ning?n seguro.  ?- Puede imprimir el cup?n correspondiente y llevarlo con su receta a la farmacia.  ?- Tambi?n puede pasar por nuestra oficina durante el horario de atenci?n regular y recoger una tarjeta de cupones de GoodRx.  ?- Si necesita que su receta se env?e electr?nicamente a una farmacia diferente, informe a nuestra oficina a trav?s de  MyChart de [place] o por tel?fono llamando al [phone] y presione la opci?n 4.  ?

## 2021-06-27 NOTE — Progress Notes (Signed)
? ?Follow-Up Visit ?  ?Subjective  ?Barbara Santos is a 86 y.o. female who presents for the following: Actinic Keratosis (3 month recheck. Face. Aks Tx with LN2.  Hx ISKs Tx with LN2, one at forehead did not resolve). ?The patient has spots, moles and lesions to be evaluated, some may be new or changing and the patient has concerns that these could be cancer. ? ?The following portions of the chart were reviewed this encounter and updated as appropriate:  Tobacco  Allergies  Meds  Problems  Med Hx  Surg Hx  Fam Hx   ?  ?Review of Systems: No other skin or systemic complaints except as noted in HPI or Assessment and Plan. ? ?Objective  ?Well appearing patient in no apparent distress; mood and affect are within normal limits. ? ?A focused examination was performed including head, including the scalp, face, neck, nose, ears, eyelids, and lips. Relevant physical exam findings are noted in the Assessment and Plan. ? ?Right Forehead ?1.2 cm pink pearly papule ? ? ? ? ?Left Ear x1, face x4 (5) ?Erythematous thin papules/macules with gritty scale.  ? ?face x11 (11) ?Erythematous keratotic or waxy stuck-on papule or plaque. ? ? ?Assessment & Plan  ?Neoplasm of skin ?Right Forehead ? ?Epidermal / dermal shaving ? ?Lesion diameter (cm):  1.2 ?Informed consent: discussed and consent obtained   ?Timeout: patient name, date of birth, surgical site, and procedure verified   ?Procedure prep:  Patient was prepped and draped in usual sterile fashion ?Prep type:  Isopropyl alcohol ?Anesthesia: the lesion was anesthetized in a standard fashion   ?Anesthetic:  1% lidocaine w/ epinephrine 1-100,000 buffered w/ 8.4% NaHCO3 ?Instrument used: flexible razor blade   ?Hemostasis achieved with: pressure, aluminum chloride and electrodesiccation   ?Outcome: patient tolerated procedure well   ?Post-procedure details: sterile dressing applied and wound care instructions given   ?Dressing type: bandage and petrolatum   ? ?Destruction  of lesion ?Complexity: extensive   ?Destruction method: electrodesiccation and curettage   ?Informed consent: discussed and consent obtained   ?Timeout:  patient name, date of birth, surgical site, and procedure verified ?Procedure prep:  Patient was prepped and draped in usual sterile fashion ?Prep type:  Isopropyl alcohol ?Anesthesia: the lesion was anesthetized in a standard fashion   ?Anesthetic:  1% lidocaine w/ epinephrine 1-100,000 buffered w/ 8.4% NaHCO3 ?Curettage performed in three different directions: Yes   ?Electrodesiccation performed over the curetted area: Yes   ?Curettage cycles:  3 ?Lesion length (cm):  1.2 ?Lesion width (cm):  1.2 ?Margin per side (cm):  0.2 ?Final wound size (cm):  1.6 ?Hemostasis achieved with:  pressure and aluminum chloride ?Outcome: patient tolerated procedure well with no complications   ?Post-procedure details: sterile dressing applied and wound care instructions given   ?Dressing type: bandage and petrolatum   ? ?Specimen 1 - Surgical pathology ?Differential Diagnosis: BCC ? ?Check Margins: No ? ?AK (actinic keratosis) (5) ?Left Ear x1, face x4 ? ?Actinic keratoses are precancerous spots that appear secondary to cumulative UV radiation exposure/sun exposure over time. They are chronic with expected duration over 1 year. A portion of actinic keratoses will progress to squamous cell carcinoma of the skin. It is not possible to reliably predict which spots will progress to skin cancer and so treatment is recommended to prevent development of skin cancer. ? ?Recommend daily broad spectrum sunscreen SPF 30+ to sun-exposed areas, reapply every 2 hours as needed.  ?Recommend staying in the shade or wearing long  sleeves, sun glasses (UVA+UVB protection) and wide brim hats (4-inch brim around the entire circumference of the hat). ?Call for new or changing lesions. ? ?Destruction of lesion - Left Ear x1, face x4 ?Complexity: simple   ?Destruction method: cryotherapy   ?Informed  consent: discussed and consent obtained   ?Timeout:  patient name, date of birth, surgical site, and procedure verified ?Lesion destroyed using liquid nitrogen: Yes   ?Region frozen until ice ball extended beyond lesion: Yes   ?Outcome: patient tolerated procedure well with no complications   ?Post-procedure details: wound care instructions given   ? ?Inflamed seborrheic keratosis (11) ?face x11 ? ?Destruction of lesion - face x11 ?Complexity: simple   ?Destruction method: cryotherapy   ?Informed consent: discussed and consent obtained   ?Timeout:  patient name, date of birth, surgical site, and procedure verified ?Lesion destroyed using liquid nitrogen: Yes   ?Region frozen until ice ball extended beyond lesion: Yes   ?Outcome: patient tolerated procedure well with no complications   ?Post-procedure details: wound care instructions given   ? ?Actinic Damage ?- chronic, secondary to cumulative UV radiation exposure/sun exposure over time ?- diffuse scaly erythematous macules with underlying dyspigmentation ?- Recommend daily broad spectrum sunscreen SPF 30+ to sun-exposed areas, reapply every 2 hours as needed.  ?- Recommend staying in the shade or wearing long sleeves, sun glasses (UVA+UVB protection) and wide brim hats (4-inch brim around the entire circumference of the hat). ?- Call for new or changing lesions. ? ?Return in about 4 months (around 10/27/2021) for Biopsy Follow Up. ? ?I, Emelia Salisbury, CMA, am acting as scribe for Sarina Ser, MD. ?Documentation: I have reviewed the above documentation for accuracy and completeness, and I agree with the above. ? ?Sarina Ser, MD ? ? ?

## 2021-06-29 ENCOUNTER — Telehealth: Payer: Self-pay

## 2021-06-29 NOTE — Telephone Encounter (Signed)
Left message on voicemail to return my call.  

## 2021-06-29 NOTE — Telephone Encounter (Signed)
-----   Message from Ralene Bathe, MD sent at 06/28/2021  6:26 PM EST ----- ?Diagnosis ?Skin , right forehead ?BASAL CELL CARCINOMA, NODULAR AND INFILTRATIVE PATTERNS, BASE INVOLVED ? ?Cancer - BCC ?Already treated ?Recheck next visit ?

## 2021-06-29 NOTE — Telephone Encounter (Signed)
Patient advised of results.

## 2021-07-29 ENCOUNTER — Other Ambulatory Visit: Payer: Self-pay | Admitting: Family Medicine

## 2021-07-29 DIAGNOSIS — E119 Type 2 diabetes mellitus without complications: Secondary | ICD-10-CM

## 2021-07-29 NOTE — Telephone Encounter (Signed)
Requested Prescriptions  ?Pending Prescriptions Disp Refills  ?? ONETOUCH VERIO test strip [Pharmacy Med Name: ONE TOUCH VERIO TEST STRIP] 100 strip 1  ?  Sig: CHECK BLOOD SUGAR UP TO ONCE DAILY  ?  ? Endocrinology: Diabetes - Testing Supplies Passed - 07/29/2021  2:21 AM  ?  ?  Passed - Valid encounter within last 12 months  ?  Recent Outpatient Visits   ?      ? 4 months ago Annual physical exam  ? [place], DO  ? 9 months ago Type 2 diabetes mellitus with other specified complication, without long-term current use of insulin ([place])  ? [place], DO  ? 10 months ago Type 2 diabetes mellitus with other specified complication, without long-term current use of insulin ([place])  ? [place], DO  ? 1 year ago Annual physical exam  ? [place], DO  ? 1 year ago Acute cystitis with hematuria  ? [place], DO  ?  ?  ?Future Appointments   ?        ? In 2 months Parks Ranger, [facility], [place]  ? In 3 months Ralene Bathe, MD [place]  ? In 5 months  [facility], [place]  ?  ? ?  ?  ?  ? ? ?

## 2021-08-04 ENCOUNTER — Encounter: Payer: Self-pay | Admitting: Family Medicine

## 2021-08-04 DIAGNOSIS — Z961 Presence of intraocular lens: Secondary | ICD-10-CM | POA: Diagnosis not present

## 2021-08-04 LAB — HM DIABETES EYE EXAM

## 2021-08-11 ENCOUNTER — Other Ambulatory Visit: Payer: Self-pay | Admitting: Family Medicine

## 2021-08-11 DIAGNOSIS — E7800 Pure hypercholesterolemia, unspecified: Secondary | ICD-10-CM

## 2021-08-11 NOTE — Telephone Encounter (Signed)
Requested Prescriptions  ?Pending Prescriptions Disp Refills  ?? rosuvastatin (CRESTOR) 5 MG tablet [Pharmacy Med Name: ROSUVASTATIN TAB '5MG'$ ] 90 tablet 0  ?  Sig: TAKE 1 TABLET AT BEDTIME  ?  ? Cardiovascular:  Antilipid - Statins 2 Failed - 08/11/2021  8:47 AM  ?  ?  Failed - Cr in normal range and within 360 days  ?  Creat  ?Date Value Ref Range Status  ?03/29/2021 0.58 (L) 0.60 - 0.95 mg/dL Final  ?   ?  ?  Failed - Lipid Panel in normal range within the last 12 months  ?  Cholesterol, Total  ?Date Value Ref Range Status  ?01/12/2015 176 100 - 199 mg/dL Final  ? ?Cholesterol  ?Date Value Ref Range Status  ?03/15/2021 146 <200 mg/dL Final  ? ?LDL Cholesterol (Calc)  ?Date Value Ref Range Status  ?03/15/2021 56 mg/dL (calc) Final  ?  Comment:  ?  Reference range: <100 ?Marland Kitchen ?Desirable range <100 mg/dL for primary prevention;   ?<70 mg/dL for patients with CHD or diabetic patients  ?with > or = 2 CHD risk factors. ?. ?LDL-C is now calculated using the Martin-Hopkins  ?calculation, which is a validated novel method providing  ?better accuracy than the Friedewald equation in the  ?estimation of LDL-C.  ?Cresenciano Genre et al. Annamaria Helling. 0174;944(96): 2061-2068  ?(http://education.QuestDiagnostics.com/faq/FAQ164) ?  ? ?HDL  ?Date Value Ref Range Status  ?03/15/2021 76 > OR = 50 mg/dL Final  ?01/12/2015 54 >39 mg/dL Final  ?  Comment:  ?  According to ATP-III Guidelines, HDL-C >59 mg/dL is considered a ?negative risk factor for CHD. ?  ? ?Triglycerides  ?Date Value Ref Range Status  ?03/15/2021 62 <150 mg/dL Final  ? ?  ?  ?  Passed - Patient is not pregnant  ?  ?  Passed - Valid encounter within last 12 months  ?  Recent Outpatient Visits   ?      ? 4 months ago Annual physical exam  ? [place], DO  ? 9 months ago Type 2 diabetes mellitus with other specified complication, without long-term current use of insulin ([place])  ? [place], DO  ? 10 months  ago Type 2 diabetes mellitus with other specified complication, without long-term current use of insulin ([place])  ? [place], DO  ? 1 year ago Annual physical exam  ? [place], DO  ? 1 year ago Acute cystitis with hematuria  ? [place], DO  ?  ?  ?Future Appointments   ?        ? In 1 month Karamalegos, Devonne Doughty, DO [facility], [place]  ? In 2 months Ralene Bathe, MD [place]  ? In 5 months  [facility], [place]  ?  ? ?  ?  ?  ? ? ?

## 2021-09-05 ENCOUNTER — Other Ambulatory Visit: Payer: Self-pay | Admitting: Family Medicine

## 2021-09-05 DIAGNOSIS — Z1231 Encounter for screening mammogram for malignant neoplasm of breast: Secondary | ICD-10-CM

## 2021-09-27 ENCOUNTER — Other Ambulatory Visit: Payer: Self-pay | Admitting: Family Medicine

## 2021-09-27 ENCOUNTER — Encounter: Payer: Self-pay | Admitting: Family Medicine

## 2021-09-27 ENCOUNTER — Ambulatory Visit (INDEPENDENT_AMBULATORY_CARE_PROVIDER_SITE_OTHER): Payer: Medicare HMO | Admitting: Family Medicine

## 2021-09-27 VITALS — BP 126/68 | HR 69 | Ht 61.0 in | Wt 89.6 lb

## 2021-09-27 DIAGNOSIS — I10 Essential (primary) hypertension: Secondary | ICD-10-CM

## 2021-09-27 DIAGNOSIS — E7800 Pure hypercholesterolemia, unspecified: Secondary | ICD-10-CM

## 2021-09-27 DIAGNOSIS — E1169 Type 2 diabetes mellitus with other specified complication: Secondary | ICD-10-CM | POA: Diagnosis not present

## 2021-09-27 DIAGNOSIS — E039 Hypothyroidism, unspecified: Secondary | ICD-10-CM

## 2021-09-27 DIAGNOSIS — E440 Moderate protein-calorie malnutrition: Secondary | ICD-10-CM

## 2021-09-27 DIAGNOSIS — Z0000 Encounter for general adult medical examination without abnormal findings: Secondary | ICD-10-CM

## 2021-09-27 LAB — POCT GLYCOSYLATED HEMOGLOBIN (HGB A1C): Hemoglobin A1C: 7 % — AB (ref 4.0–5.6)

## 2021-09-27 MED ORDER — ROSUVASTATIN CALCIUM 5 MG PO TABS: 5.0000 mg | ORAL_TABLET | Freq: Every day | ORAL | 3 refills | Status: DC

## 2021-09-27 MED ORDER — METFORMIN HCL ER 500 MG PO TB24: 500.0000 mg | ORAL_TABLET | Freq: Two times a day (BID) | ORAL | 3 refills | Status: DC

## 2021-09-27 MED ORDER — VALSARTAN 40 MG PO TABS: 40.0000 mg | ORAL_TABLET | Freq: Every day | ORAL | 3 refills | Status: DC

## 2021-09-27 NOTE — Assessment & Plan Note (Signed)
Weight down less than 1 lb, seems stable over >6 months

## 2021-09-27 NOTE — Progress Notes (Signed)
Subjective:    Patient ID: Barbara Santos, female    DOB: 1935-07-10, 86 y.o.   MRN: [id number]  Barbara Santos is a 86 y.o. female presenting on 09/27/2021 for Diabetes and Hypertension   HPI  CHRONIC DM, Type 2: BMI >16.93 moderate protein calorie malnutrition - Today reports doing well on medicine overall CBGs: Avg 105 to 150 fasting AM, variety of other readings before and 2+ hours after meals, overall some readings higher but seems < 170 now. detailed CBG log with her. Meds: Metformin XR '500mg'$  BID Currently on ARB Lifestyle: Weight stable at 89 lbs and oz unchanged in past 9 months. - Diet - often eating baked chicken and higher protein supplement drinks. 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 Admits rarely having some tingling at times in legs but otherwise no problem.   CHRONIC HTN: BP remains stable at home. Last lab was slightly lower K 4.1, previous high at 5.8 Current Meds - Valsartan '40mg'$  daily   Reports good compliance, took meds today. Tolerating well, w/o complaints. Lifestyle: - Diet: Limited sodium diet now, avoid salt added. Limited meats Admits mild HA in AM self limited Denies CP, dyspnea, edema, dizziness / lightheadedness   HYPERLIPIDEMIA: - Reports no concerns. Last lipid panel 02/2021, controlled  - Currently taking Rosuvastatin '5mg'$ , tolerating well without side effects or myalgias      01/11/2021    2:41 PM 03/17/2020    9:00 AM 01/06/2020    1:25 PM  Depression screen PHQ 2/9  Decreased Interest 0 0 0  Down, Depressed, Hopeless 0 0 0  PHQ - 2 Score 0 0 0    Social History   Tobacco Use   Smoking status: Never   Smokeless tobacco: Never  Vaping Use   Vaping Use: Never used  Substance Use Topics   Alcohol use: No     Alcohol/week: 0.0 standard drinks   Drug use: No    Review of Systems Per HPI unless specifically indicated above     Objective:    BP 126/68   Pulse 69   Ht '5\' 1"'$  (1.549 m)   Wt 89 lb 9.6 oz (40.6 kg)   SpO2 96%   BMI 16.93 kg/m   Wt Readings from Last 3 Encounters:  09/27/21 89 lb 9.6 oz (40.6 kg)  03/22/21 90 lb 12.8 oz (41.2 kg)  01/11/21 89 lb (40.4 kg)    Physical Exam Vitals and nursing note reviewed.  Constitutional:      General: She is not in acute distress.    Appearance: She is well-developed. She is not diaphoretic.     Comments: Well-appearing thin appearance, comfortable, cooperative  HENT:     Head: Normocephalic and atraumatic.  Eyes:     General:        Right eye: No discharge.        Left eye: No discharge.     Conjunctiva/sclera: Conjunctivae normal.  Neck:     Thyroid: No thyromegaly.  Cardiovascular:     Rate and Rhythm: Normal rate and regular rhythm.     Heart sounds: Normal heart sounds. No murmur heard. Pulmonary:     Effort: Pulmonary effort is normal. No respiratory distress.     Breath sounds: Normal breath sounds. No wheezing or rales.  Musculoskeletal:  General: Normal range of motion.     Cervical back: Normal range of motion and neck supple.  Lymphadenopathy:     Cervical: No cervical adenopathy.  Skin:    General: Skin is warm and dry.     Findings: No erythema or rash.  Neurological:     Mental Status: She is alert and oriented to person, place, and time.  Psychiatric:        Behavior: Behavior normal.     Comments: Well groomed, good eye contact, normal speech and thoughts    Recent Labs    09/28/20 1011 03/15/21 0807 09/27/21 1007  HGBA1C 6.8* 7.2* 7.0*    Results for orders placed or performed in visit on 09/27/21  POCT HgB A1C  Result Value Ref Range   Hemoglobin A1C 7.0 (A) 4.0 - 5.6 %      Assessment & Plan:   Problem List Items Addressed This Visit     Type 2 diabetes mellitus with other  specified complication ([place]) - Primary    A1c improved to 7.0 Complicated by hypothyroidism and hyperlipidemia Last urine microalbumin negative 2021  Plan:  Continue Metformin XR 500 BID Encourage improved lifestyle -and acceptable to allow A1c / CBGs to increase to 7+ range Check CBG, bring log to next visit for review Continue ARB Continue Statin      Relevant Medications   valsartan (DIOVAN) 40 MG tablet   rosuvastatin (CRESTOR) 5 MG tablet   metFORMIN (GLUCOPHAGE-XR) 500 MG 24 hr tablet   Other Relevant Orders   POCT HgB A1C (Completed)   Urine Microalbumin w/creat. ratio   Protein calorie malnutrition (HCC)    Weight down less than 1 lb, seems stable over >6 months       Hypothyroidism    Stable, controlled hypothyroidism Last TSH normal, due for next lab in 6 months Continue Levothyroxine 37mg daily       Essential hypertension    Well-controlled HTN - Home BP readings reviewed, normal, has cuff  No known complications    Plan:  1.  Continue current BP regimen Valsartan '40mg'$  daily 2. Encourage improved lifestyle - low sodium diet, regular exercise 3. Continue monitor BP outside office, bring readings to next visit, if persistently >140/90 or new symptoms notify office sooner       Relevant Medications   valsartan (DIOVAN) 40 MG tablet   rosuvastatin (CRESTOR) 5 MG tablet   Elevated LDL cholesterol level    Controlled cholesterol on statin and lifestyle The ASCVD Risk score (Arnett DK, et al., 2019) failed to calculate for the following reasons:   The 2019 ASCVD risk score is only valid for ages 478to 739  Plan: 1. Continue Rosuvastatin '5mg'$  nightly - Defer ASA - history of easy bleeding on trial years ago - Follow-up yearly lipids       Relevant Medications   rosuvastatin (CRESTOR) 5 MG tablet    Meds ordered this encounter  Medications   valsartan (DIOVAN) 40 MG tablet    Sig: Take 1 tablet (40 mg total) by mouth daily.    Dispense:  90 tablet     Refill:  3   rosuvastatin (CRESTOR) 5 MG tablet    Sig: Take 1 tablet (5 mg total) by mouth at bedtime.    Dispense:  90 tablet    Refill:  3   metFORMIN (GLUCOPHAGE-XR) 500 MG 24 hr tablet    Sig: Take 1 tablet (500 mg total) by mouth 2 (two) times daily  with a meal.    Dispense:  180 tablet    Refill:  3      Follow up plan: Return in about 6 months (around 03/29/2022) for 6 month fasting lab only then 1 week later Annual Physical.  Future labs ordered for 03/2022   Nobie Putnam, [facility] [employer] 09/27/2021, 10:00 AM

## 2021-09-27 NOTE — Patient Instructions (Addendum)
Thank you for coming to the office today.  Urine protein test today.  Recent Labs    09/28/20 1011 03/15/21 0807 09/27/21 1007  HGBA1C 6.8* 7.2* 7.0*   Good work overall on the diet and lifestyle.  Weight is essentially unchanged, 89-90 lbs. Consistent in past 1 year.  Refills sent to [facility]  DUE for FASTING BLOOD WORK (no food or drink after midnight before the lab appointment, only water or coffee without cream/sugar on the morning of)  SCHEDULE "Lab Only" visit in the morning at the clinic for lab draw in 6 MONTHS   - Make sure Lab Only appointment is at about 1 week before your next appointment, so that results will be available  For Lab Results, once available within 2-3 days of blood draw, you can can log in to MyChart online to view your results and a brief explanation. Also, we can discuss results at next follow-up visit.   Please schedule a Follow-up Appointment to: Return in about 6 months (around 03/29/2022) for 6 month fasting lab only then 1 week later Annual Physical.  If you have any other questions or concerns, please feel free to call the office or send a message through [place]. You may also schedule an earlier appointment if necessary.  Additionally, you may be receiving a survey about your experience at our office within a few days to 1 week by e-mail or mail. We value your feedback.  Nobie Putnam, DO [place]

## 2021-09-27 NOTE — Assessment & Plan Note (Signed)
Controlled cholesterol on statin and lifestyle The ASCVD Risk score (Arnett DK, et al., 2019) failed to calculate for the following reasons:   The 2019 ASCVD risk score is only valid for ages 40 to 79   Plan: 1. Continue Rosuvastatin 5mg nightly - Defer ASA - history of easy bleeding on trial years ago - Follow-up yearly lipids 

## 2021-09-27 NOTE — Assessment & Plan Note (Signed)
A1c improved to 7.0 Complicated by hypothyroidism and hyperlipidemia Last urine microalbumin negative 2021  Plan:  Continue Metformin XR 500 BID Encourage improved lifestyle -and acceptable to allow A1c / CBGs to increase to 7+ range Check CBG, bring log to next visit for review Continue ARB Continue Statin

## 2021-09-27 NOTE — Assessment & Plan Note (Signed)
Stable, controlled hypothyroidism Last TSH normal, due for next lab in 6 months Continue Levothyroxine 48mg daily

## 2021-09-27 NOTE — Assessment & Plan Note (Signed)
Well-controlled HTN - Home BP readings reviewed, normal, has cuff  No known complications    Plan:  1.  Continue current BP regimen Valsartan '40mg'$  daily 2. Encourage improved lifestyle - low sodium diet, regular exercise 3. Continue monitor BP outside office, bring readings to next visit, if persistently >140/90 or new symptoms notify office sooner

## 2021-09-28 LAB — MICROALBUMIN / CREATININE URINE RATIO
Creatinine, Urine: 37 mg/dL (ref 20–275)
Microalb Creat Ratio: 54 mcg/mg creat — ABNORMAL HIGH (ref <30)
Microalb, Ur: 2 mg/dL

## 2021-10-06 ENCOUNTER — Ambulatory Visit
Admission: RE | Admit: 2021-10-06 | Discharge: 2021-10-06 | Disposition: A | Discharge status: Home / Self Care | Payer: Medicare HMO | Source: Ambulatory Visit | Attending: Family Medicine | Admitting: Family Medicine

## 2021-10-06 DIAGNOSIS — Z1231 Encounter for screening mammogram for malignant neoplasm of breast: Secondary | ICD-10-CM | POA: Insufficient documentation

## 2021-10-07 ENCOUNTER — Other Ambulatory Visit: Payer: Self-pay | Admitting: Family Medicine

## 2021-10-07 DIAGNOSIS — R928 Other abnormal and inconclusive findings on diagnostic imaging of breast: Secondary | ICD-10-CM

## 2021-10-07 DIAGNOSIS — N6489 Other specified disorders of breast: Secondary | ICD-10-CM

## 2021-10-07 DIAGNOSIS — N63 Unspecified lump in unspecified breast: Secondary | ICD-10-CM

## 2021-10-26 ENCOUNTER — Ambulatory Visit
Admission: RE | Admit: 2021-10-26 | Discharge: 2021-10-26 | Disposition: A | Discharge status: Home / Self Care | Payer: Medicare HMO | Source: Ambulatory Visit | Attending: Family Medicine | Admitting: Family Medicine

## 2021-10-26 DIAGNOSIS — N6489 Other specified disorders of breast: Secondary | ICD-10-CM | POA: Diagnosis not present

## 2021-10-26 DIAGNOSIS — R922 Inconclusive mammogram: Secondary | ICD-10-CM | POA: Diagnosis not present

## 2021-10-26 DIAGNOSIS — R928 Other abnormal and inconclusive findings on diagnostic imaging of breast: Secondary | ICD-10-CM | POA: Diagnosis not present

## 2021-10-26 DIAGNOSIS — N63 Unspecified lump in unspecified breast: Secondary | ICD-10-CM

## 2021-10-31 ENCOUNTER — Ambulatory Visit: Payer: Medicare HMO | Admitting: Dermatology

## 2021-10-31 DIAGNOSIS — L821 Other seborrheic keratosis: Secondary | ICD-10-CM | POA: Diagnosis not present

## 2021-10-31 DIAGNOSIS — L820 Inflamed seborrheic keratosis: Secondary | ICD-10-CM | POA: Diagnosis not present

## 2021-10-31 DIAGNOSIS — Z85828 Personal history of other malignant neoplasm of skin: Secondary | ICD-10-CM | POA: Diagnosis not present

## 2021-10-31 DIAGNOSIS — L578 Other skin changes due to chronic exposure to nonionizing radiation: Secondary | ICD-10-CM | POA: Diagnosis not present

## 2021-10-31 NOTE — Progress Notes (Signed)
   Follow-Up Visit   Subjective  Barbara Santos is a 86 y.o. female who presents for the following: Follow-up (Biopsy proven BCC of right forehead - treated with [facility] 06/27/2021). The patient has spots, moles and lesions to be evaluated, some may be new or changing and the patient has concerns that these could be cancer.  The following portions of the chart were reviewed this encounter and updated as appropriate:   Tobacco  Allergies  Meds  Problems  Med Hx  Surg Hx  Fam Hx     Review of Systems:  No other skin or systemic complaints except as noted in HPI or Assessment and Plan.  Objective  Well appearing patient in no apparent distress; mood and affect are within normal limits.  A focused examination was performed including face. Relevant physical exam findings are noted in the Assessment and Plan.  Right Forehead Well healed EDC site  Right neck x 1, left cheek x 1 (2) Erythematous stuck-on, waxy papule or plaque   Assessment & Plan   Actinic Damage - chronic, secondary to cumulative UV radiation exposure/sun exposure over time - diffuse scaly erythematous macules with underlying dyspigmentation - Recommend daily broad spectrum sunscreen SPF 30+ to sun-exposed areas, reapply every 2 hours as needed.  - Recommend staying in the shade or wearing long sleeves, sun glasses (UVA+UVB protection) and wide brim hats (4-inch brim around the entire circumference of the hat). - Call for new or changing lesions.  Seborrheic Keratoses - Stuck-on, waxy, tan-brown papules and/or plaques  - Benign-appearing - Discussed benign etiology and prognosis. - Observe - Call for any changes  History of basal cell carcinoma (BCC) Right Forehead  Clear. Observe for recurrence. Call clinic for new or changing lesions.  Recommend regular skin exams, daily broad-spectrum spf 30+ sunscreen use, and photoprotection.     Inflamed seborrheic keratosis (2) Right neck x 1, left cheek x  1  Destruction of lesion - Right neck x 1, left cheek x 1 Complexity: simple   Destruction method: cryotherapy   Informed consent: discussed and consent obtained   Timeout:  patient name, date of birth, surgical site, and procedure verified Lesion destroyed using liquid nitrogen: Yes   Region frozen until ice ball extended beyond lesion: Yes   Outcome: patient tolerated procedure well with no complications   Post-procedure details: wound care instructions given     Return in about 8 months (around 07/02/2022) for TBSE.  I, Ashok Cordia, CMA, am acting as scribe for Sarina Ser, MD . Documentation: I have reviewed the above documentation for accuracy and completeness, and I agree with the above.  Sarina Ser, MD

## 2021-10-31 NOTE — Patient Instructions (Signed)
Cryotherapy Aftercare  Wash gently with soap and water everyday.   Apply Vaseline and Band-Aid daily until healed.     Due to recent changes in healthcare laws, you may see results of your pathology and/or laboratory studies on MyChart before the doctors have had a chance to review them. We understand that in some cases there may be results that are confusing or concerning to you. Please understand that not all results are received at the same time and often the doctors may need to interpret multiple results in order to provide you with the best plan of care or course of treatment. Therefore, we ask that you please give us 2 business days to thoroughly review all your results before contacting the office for clarification. Should we see a critical lab result, you will be contacted sooner.   If You Need Anything After Your Visit  If you have any questions or concerns for your doctor, please call our main line at [phone] and press option 4 to reach your doctor's medical assistant. If no one answers, please leave a voicemail as directed and we will return your call as soon as possible. Messages left after 4 pm will be answered the following business day.   You may also send us a message via MyChart. We typically respond to MyChart messages within 1-2 business days.  For prescription refills, please ask your pharmacy to contact our office. Our fax number is [phone].  If you have an urgent issue when the clinic is closed that cannot wait until the next business day, you can page your doctor at the number below.    Please note that while we do our best to be available for urgent issues outside of office hours, we are not available 24/7.   If you have an urgent issue and are unable to reach us, you may choose to seek medical care at your doctor's office, retail clinic, urgent care center, or emergency room.  If you have a medical emergency, please immediately call 911 or go to the  emergency department.  Pager Numbers  - Dr. Kowalski: [phone]  - Dr. Moye: [phone]  - Dr. Stewart: [phone]  In the event of inclement weather, please call our main line at [phone] for an update on the status of any delays or closures.  Dermatology Medication Tips: Please keep the boxes that topical medications come in in order to help keep track of the instructions about where and how to use these. Pharmacies typically print the medication instructions only on the boxes and not directly on the medication tubes.   If your medication is too expensive, please contact our office at [phone] option 4 or send us a message through MyChart.   We are unable to tell what your co-pay for medications will be in advance as this is different depending on your insurance coverage. However, we may be able to find a substitute medication at lower cost or fill out paperwork to get insurance to cover a needed medication.   If a prior authorization is required to get your medication covered by your insurance company, please allow us 1-2 business days to complete this process.  Drug prices often vary depending on where the prescription is filled and some pharmacies may offer cheaper prices.  The website www.goodrx.com contains coupons for medications through different pharmacies. The prices here do not account for what the cost may be with help from insurance (it may be cheaper with your insurance), but the website can   give you the price if you did not use any insurance.  - You can print the associated coupon and take it with your prescription to the pharmacy.  - You may also stop by our office during regular business hours and pick up a GoodRx coupon card.  - If you need your prescription sent electronically to a different pharmacy, notify our office through [place] MyChart or by phone at [phone] option 4.     Si Usted Necesita Algo Despus de Su Visita  Tambin puede  enviarnos un mensaje a travs de MyChart. Por lo general respondemos a los mensajes de MyChart en el transcurso de 1 a 2 das hbiles.  Para renovar recetas, por favor pida a su farmacia que se ponga en contacto con nuestra oficina. Nuestro nmero de fax es el [phone].  Si tiene un asunto urgente cuando la clnica est cerrada y que no puede esperar hasta el siguiente da hbil, puede llamar/localizar a su doctor(a) al nmero que aparece a continuacin.   Por favor, tenga en cuenta que aunque hacemos todo lo posible para estar disponibles para asuntos urgentes fuera del horario de oficina, no estamos disponibles las 24 horas del da, los 7 das de la semana.   Si tiene un problema urgente y no puede comunicarse con nosotros, puede optar por buscar atencin mdica  en el consultorio de su doctor(a), en una clnica privada, en un centro de atencin urgente o en una sala de emergencias.  Si tiene una emergencia mdica, por favor llame inmediatamente al 911 o vaya a la sala de emergencias.  Nmeros de bper  - Dr. Kowalski: [phone]  - Dra. Moye: [phone]  - Dra. Stewart: [phone]  En caso de inclemencias del tiempo, por favor llame a nuestra lnea principal al [phone] para una actualizacin sobre el estado de cualquier retraso o cierre.  Consejos para la medicacin en dermatologa: Por favor, guarde las cajas en las que vienen los medicamentos de uso tpico para ayudarle a seguir las instrucciones sobre dnde y cmo usarlos. Las farmacias generalmente imprimen las instrucciones del medicamento slo en las cajas y no directamente en los tubos del medicamento.   Si su medicamento es muy caro, por favor, pngase en contacto con nuestra oficina llamando al [phone] y presione la opcin 4 o envenos un mensaje a travs de MyChart.   No podemos decirle cul ser su copago por los medicamentos por adelantado ya que esto es diferente dependiendo de la cobertura de su seguro.  Sin embargo, es posible que podamos encontrar un medicamento sustituto a menor costo o llenar un formulario para que el seguro cubra el medicamento que se considera necesario.   Si se requiere una autorizacin previa para que su compaa de seguros cubra su medicamento, por favor permtanos de 1 a 2 das hbiles para completar este proceso.  Los precios de los medicamentos varan con frecuencia dependiendo del lugar de dnde se surte la receta y alguna farmacias pueden ofrecer precios ms baratos.  El sitio web www.goodrx.com tiene cupones para medicamentos de diferentes farmacias. Los precios aqu no tienen en cuenta lo que podra costar con la ayuda del seguro (puede ser ms barato con su seguro), pero el sitio web puede darle el precio si no utiliz ningn seguro.  - Puede imprimir el cupn correspondiente y llevarlo con su receta a la farmacia.  - Tambin puede pasar por nuestra oficina durante el horario de atencin regular y recoger una tarjeta de cupones de GoodRx.  -   Si necesita que su receta se enve electrnicamente a una farmacia diferente, informe a nuestra oficina a travs de MyChart de [place] o por telfono llamando al [phone] y presione la opcin 4.  

## 2021-11-08 ENCOUNTER — Encounter: Payer: Self-pay | Admitting: Dermatology

## 2022-01-11 ENCOUNTER — Other Ambulatory Visit: Payer: Self-pay | Admitting: Family Medicine

## 2022-01-11 DIAGNOSIS — E119 Type 2 diabetes mellitus without complications: Secondary | ICD-10-CM

## 2022-01-11 NOTE — Telephone Encounter (Signed)
Requested Prescriptions  Pending Prescriptions Disp Refills  . ONETOUCH VERIO test strip [employer] Med Name: [place] TEST STRIP] 100 strip 1    Sig: CHECK BLOOD SUGAR UP TO ONCE DAILY     Endocrinology: Diabetes - Testing Supplies Passed - 01/11/2022  8:31 AM      Passed - Valid encounter within last 12 months    Recent Outpatient Visits          3 months ago Type 2 diabetes mellitus with other specified complication, without long-term current use of insulin ([place])   [facility], Devonne Doughty, DO   9 months ago Annual physical exam   [facility] [place], Devonne Doughty, DO   1 year ago Type 2 diabetes mellitus with other specified complication, without long-term current use of insulin [facility])   [facility], Devonne Doughty, DO   1 year ago Type 2 diabetes mellitus with other specified complication, without long-term current use of insulin ([place])   [facility] [place], Devonne Doughty, DO   1 year ago Annual physical exam   [place], DO      Future Appointments            In 1 week  [facility], [place]   In 2 months [place], Devonne Doughty, DO [facility], [place]   In 5 months Ralene Bathe, MD [place]

## 2022-01-17 ENCOUNTER — Ambulatory Visit: Payer: Medicare HMO

## 2022-01-20 ENCOUNTER — Ambulatory Visit (INDEPENDENT_AMBULATORY_CARE_PROVIDER_SITE_OTHER): Payer: Medicare HMO

## 2022-01-20 VITALS — BP 124/60 | Ht 61.0 in | Wt 90.4 lb

## 2022-01-20 DIAGNOSIS — Z0000 Encounter for general adult medical examination without abnormal findings: Secondary | ICD-10-CM

## 2022-01-20 NOTE — Progress Notes (Signed)
Subjective:   Barbara Santos is a 86 y.o. female who presents for Medicare Annual (Subsequent) preventive examination.  Review of Systems     Cardiac Risk Factors include: advanced age (>43mn, >>17women);diabetes mellitus;hypertension;sedentary lifestyle     Objective:    Today's Vitals   01/20/22 0929  BP: 124/60  Weight: 90 lb 6.4 oz (41 kg)  Height: '5\' 1"'$  (1.549 m)   Body mass index is 17.08 kg/m.     01/20/2022    9:34 AM 01/11/2021    2:40 PM 01/28/2020    6:25 AM 01/06/2020    1:23 PM 12/31/2019    6:44 AM 11/19/2018    9:28 AM 12/31/2017    8:25 AM  Advanced Directives  Does Patient Have a Medical Advance Directive? No Yes Yes Yes Yes Yes Yes  Type of ASocial research officer, governmentLiving will [place] will Living will;Healthcare Power of [place] will Living will;Healthcare Power of Attorney Living will  Does patient want to make changes to medical advance directive?   No - Patient declined  No - Patient declined    Copy of [place] Chart?  No - copy requested Yes - validated most recent copy scanned in chart (See row information) No - copy requested Yes - validated most recent copy scanned in chart (See row information) No - copy requested   Would patient like information on creating a medical advance directive? No - Patient declined          Current Medications (verified) Outpatient Encounter Medications as of 01/20/2022  Medication Sig   Cholecalciferol (VITAMIN D) 2000 UNITS tablet Take 2,000 Units by mouth daily.   diphenhydrAMINE (BENADRYL) 25 MG tablet Take 25 mg by mouth every 6 (six) hours as needed.   dorzolamide-timolol (COSOPT) 22.3-6.8 MG/ML ophthalmic solution Place 1 drop into both eyes 2 (two) times daily.    ketoconazole (NIZORAL) 2 % cream Apply 1 application topically at bedtime. Apply to feet   lactose free nutrition (BOOST) LIQD Take 237 mLs by  mouth 3 (three) times daily between meals.   levothyroxine (SYNTHROID) 50 MCG tablet Take 1 tablet (50 mcg total) by mouth daily before breakfast.   metFORMIN (GLUCOPHAGE-XR) 500 MG 24 hr tablet Take 1 tablet (500 mg total) by mouth 2 (two) times daily with a meal.   Multiple Vitamin (MULTIVITAMIN) tablet Take 1 tablet by mouth daily.   ONETOUCH DELICA LANCETS FINE MISC 1 each by Other route 3 (three) times daily.   ONETOUCH VERIO test strip CHECK BLOOD SUGAR UP TO ONCE DAILY   rosuvastatin (CRESTOR) 5 MG tablet Take 1 tablet (5 mg total) by mouth at bedtime.   valsartan (DIOVAN) 40 MG tablet Take 1 tablet (40 mg total) by mouth daily.   Nutritional Supplements (JUICE PLUS FIBRE PO) Take by mouth. (Patient not taking: Reported on 01/20/2022)   No facility-administered encounter medications on file as of 01/20/2022.    Allergies (verified) Amoxicillin and Penicillins   History: Past Medical History:  Diagnosis Date   Arthritis    Basal cell carcinoma 08/23/2009   Right dorsum lat. distal forearm.    Basal cell carcinoma 06/29/2021   R forehead - tx with ED&C   Diabetes mellitus, type 2 ([place]anna    Dysplastic nevus 08/03/2008   Right ant. thigh, just above knee. Moderate to marked atypia, extends to base. Excised 09/23/2008, margins free.   Dysplastic nevus 08/23/2009  Right post. sup. thigh. MIld atypia, limited margins free.   Glaucoma    Hx of basal cell carcinoma    R cheek   Hx of squamous cell carcinoma of skin 11/01/2009   L medial mid calf   Hyperlipidemia    Hypothyroidism    Insomnia    Left hip pain    Protein calorie malnutrition (HCC)    Squamous cell carcinoma of skin 08/23/2009   Left medial mid calf. SCCis   Past Surgical History:  Procedure Laterality Date   CATARACT EXTRACTION W/PHACO Left 12/31/2019   Procedure: CATARACT EXTRACTION PHACO AND INTRAOCULAR LENS PLACEMENT (IOC) LEFT DIABETIC 10.20  01:17.6  13.1%;  Surgeon: Leandrew Koyanagi, MD;  Location: [facility];  Service: Ophthalmology;  Laterality: Left;  Diabetic - oral meds   CATARACT EXTRACTION W/PHACO Right 01/28/2020   Procedure: CATARACT EXTRACTION PHACO AND INTRAOCULAR LENS PLACEMENT (IOC) RIGHT DIABETIC 13.67 01:34.5 14.5%;  Surgeon: Leandrew Koyanagi, MD;  Location: [facility];  Service: Ophthalmology;  Laterality: Right;   COLONOSCOPY WITH PROPOFOL N/A 12/31/2017   Procedure: COLONOSCOPY WITH PROPOFOL;  Surgeon: Manya Silvas, MD;  Location: [facility] ENDOSCOPY;  Service: Endoscopy;  Laterality: N/A;   HERNIA REPAIR     TOTAL ABDOMINAL HYSTERECTOMY  2009   Family History  Problem Relation Age of Onset   Diabetes Mellitus II Mother    Stroke Father    Breast cancer Sister 42   Breast cancer Sister    Stroke Maternal Grandmother    Heart disease Paternal Grandfather    Social History   Socioeconomic History   Marital status: Widowed    Spouse name: Not on file   Number of children: Not on file   Years of education: Not on file   Highest education level: High school graduate  Occupational History   Not on file  Tobacco Use   Smoking status: Never   Smokeless tobacco: Never  Vaping Use   Vaping Use: Never used  Substance and Sexual Activity   Alcohol use: No    Alcohol/week: 0.0 standard drinks of alcohol   Drug use: No   Sexual activity: Not on file  Other Topics Concern   Not on file  Social History Narrative   Not on file   Social Determinants of Health   Financial Resource Strain: Low Risk  (01/20/2022)   Overall Financial Resource Strain (CARDIA)    Difficulty of Paying Living Expenses: Not hard at all  Food Insecurity: No Food Insecurity (01/20/2022)   Hunger Vital Sign    Worried About Running Out of Food in the Last Year: Never true    [place] in the Last Year: Never true  Transportation Needs: No Transportation Needs (01/20/2022)   PRAPARE - Hydrologist (Medical): No    Lack of Transportation  (Non-Medical): No  Physical Activity: Insufficiently Active (01/20/2022)   Exercise Vital Sign    Days of Exercise per Week: 3 days    Minutes of Exercise per Session: 30 min  Stress: No Stress Concern Present (01/20/2022)   [place]    Feeling of Stress : Not at all  Social Connections: Moderately Isolated (01/20/2022)   Social Connection and Isolation Panel [NHANES]    Frequency of Communication with Friends and Family: More than three times a week    Frequency of Social Gatherings with Friends and Family: More than three times a week  Attends Religious Services: More than 4 times per year    Active Member of Clubs or Organizations: No    Attends Archivist Meetings: Never    Marital Status: Widowed    Tobacco Counseling Counseling given: Not Answered   Clinical Intake:  Pre-visit preparation completed: Yes  Pain : No/denies pain     Nutritional Risks: None Diabetes: Yes CBG done?: No Did pt. bring in CBG monitor from home?: No  How often do you need to have someone help you when you read instructions, pamphlets, or other written materials from your doctor or pharmacy?: 1 - Never  Diabetic?yes Nutrition Risk Assessment:  Has the patient had any N/V/D within the last 2 months?  No  Does the patient have any non-healing wounds?  No  Has the patient had any unintentional weight loss or weight gain?  No   Diabetes:  Is the patient diabetic?  Yes  If diabetic, was a CBG obtained today?  No  Did the patient bring in their glucometer from home?  No  How often do you monitor your CBG's? Every day.   Financial Strains and Diabetes Management:  Are you having any financial strains with the device, your supplies or your medication? No .  Does the patient want to be seen by Chronic Care Management for management of their diabetes?  No  Would the patient like to be referred to a Nutritionist or  for Diabetic Management?  No   Diabetic Exams:  Diabetic Eye Exam: Completed 08/04/21.  Pt has been advised about the importance in completing this exam.  Diabetic Foot Exam: Completed 03/22/21. Pt has been advised about the importance in completing this exam.   Interpreter Needed?: No  Information entered by :: Kirke Shaggy, LPN   Activities of Daily Living    01/20/2022    9:35 AM  In your present state of health, do you have any difficulty performing the following activities:  Hearing? 0  Vision? 0  Difficulty concentrating or making decisions? 0  Walking or climbing stairs? 0  Dressing or bathing? 0  Doing errands, shopping? 0  Preparing Food and eating ? N  Using the Toilet? N  In the past six months, have you accidently leaked urine? N  Do you have problems with loss of bowel control? N  Managing your Medications? N  Managing your Finances? N  Housekeeping or managing your Housekeeping? N    Patient Care Team: Olin Hauser, DO as PCP - General (Family Medicine)  Indicate any recent Medical Services you may have received from other than [facility] providers in the past year (date may be approximate).     Assessment:   This is a routine wellness examination for [place].  Hearing/Vision screen Hearing Screening - Comments:: No aids Vision Screening - Comments:: Readers- [facility]  Dietary issues and exercise activities discussed: Current Exercise Habits: Home exercise routine, Type of exercise: walking, Time (Minutes): 30, Frequency (Times/Week): 3, Weekly Exercise (Minutes/Week): 90, Intensity: Mild   Goals Addressed             This Visit's Progress    DIET - EAT MORE FRUITS AND VEGETABLES         Depression Screen    01/20/2022    9:32 AM 01/11/2021    2:41 PM 03/17/2020    9:00 AM 01/06/2020    1:25 PM 09/15/2019    9:07 AM 11/19/2018    9:24 AM 09/19/2018    9:20 AM  PHQ 2/9 Scores  PHQ - 2 Score 1 0 0 0 0 0 0  PHQ- 9 Score 1           Fall Risk    01/20/2022    9:35 AM 01/11/2021    2:41 PM 10/26/2020    9:31 AM 03/17/2020    9:00 AM 01/06/2020    1:24 PM  Fall Risk   Falls in the past year? '1 1 1 '$ 0 1  Comment  tripped   tripped over a rock  Number falls in past yr: 1 0 1 0 0  Injury with Fall? 0 0 0 0 0  Risk for fall due to : History of fall(s) Medication side effect   Impaired balance/gait;History of fall(s);Medication side effect  Follow up Falls evaluation completed;Falls prevention discussed Falls evaluation completed;Education provided;Falls prevention discussed  Falls evaluation completed Falls evaluation completed;Education provided;Falls prevention discussed    FALL RISK PREVENTION PERTAINING TO THE HOME:  Any stairs in or around the home? No  If so, are there any without handrails? No  Home free of loose throw rugs in walkways, pet beds, electrical cords, etc? Yes  Adequate lighting in your home to reduce risk of falls? Yes   ASSISTIVE DEVICES UTILIZED TO PREVENT FALLS:  Life alert? No  Use of a cane, walker or w/c? No  Grab bars in the bathroom? Yes  Shower chair or bench in shower? Yes  Elevated toilet seat or a handicapped toilet? Yes   TIMED UP AND GO:  Was the test performed? Yes .  Length of time to ambulate 10 feet: 4 sec.   Gait steady and fast without use of assistive device  Cognitive Function:    11/06/2014    2:24 PM  MMSE - Mini Mental State Exam  Orientation to time 5  Orientation to Place 5  Registration 3  Attention/ Calculation 5  Recall 3  Language- name 2 objects 2  Language- repeat 1  Language- follow 3 step command 3  Language- read & follow direction 1  Write a sentence 1  Copy design 1  Total score 30        01/20/2022    9:40 AM 01/11/2021    2:42 PM 01/06/2020    1:27 PM 11/19/2018    9:28 AM 11/13/2017    9:31 AM  6CIT Screen  What Year? 0 points 0 points 0 points 0 points 0 points  What month? 0 points 0 points 0 points 0 points 0 points  What time? 0  points 0 points 0 points 0 points 0 points  Count back from 20 0 points 0 points 0 points 0 points 0 points  Months in reverse 0 points 0 points 2 points 0 points 0 points  Repeat phrase 0 points 0 points 0 points 0 points 0 points  Total Score 0 points 0 points 2 points 0 points 0 points    Immunizations Immunization History  Administered Date(s) Administered   Fluad Quad(high Dose 65+) 01/07/2020, 03/22/2021   Influenza, High Dose Seasonal PF 01/05/2015, 02/24/2016, 01/16/2017, 01/14/2018, 01/06/2019   Influenza-Unspecified 01/28/2014, 01/14/2018   PFIZER Comirnaty(Gray Top)Covid-19 Tri-Sucrose Vaccine 05/14/2019, 06/07/2019   PFIZER(Purple Top)SARS-COV-2 Vaccination 05/14/2019, 06/07/2019, 02/25/2020   Pneumococcal Conjugate-13 12/03/2013   Pneumococcal Polysaccharide-23 10/24/2016   Tdap 12/03/2013   Zoster Recombinat (Shingrix) 12/04/2018, 02/17/2019, 03/06/2019   Zoster, Live 04/24/2010    TDAP status: Up to date  Flu Vaccine status: Up to date  Pneumococcal vaccine status: Up to  date  Covid-19 vaccine status: Completed vaccines  Qualifies for Shingles Vaccine? Yes   Zostavax completed Yes   Shingrix Completed?: Yes  Screening Tests Health Maintenance  Topic Date Due   COVID-19 Vaccine (6 - Pfizer risk series) 04/21/2020   INFLUENZA VACCINE  11/22/2021   FOOT EXAM  03/22/2022   Diabetic kidney evaluation - GFR measurement  03/29/2022   HEMOGLOBIN A1C  03/29/2022   OPHTHALMOLOGY EXAM  08/05/2022   Diabetic kidney evaluation - Urine ACR  09/28/2022   TETANUS/TDAP  12/04/2023   Pneumonia Vaccine 78+ Years old  Completed   DEXA SCAN  Completed   Zoster Vaccines- Shingrix  Completed   HPV VACCINES  Aged Out    Health Maintenance  Health Maintenance Due  Topic Date Due   COVID-19 Vaccine (6 - Pfizer risk series) 04/21/2020   INFLUENZA VACCINE  11/22/2021    Colorectal cancer screening: No longer required.   Mammogram status: Completed 10/26/21. Repeat every  year  Bone Density status: Completed 04/24/12. Results reflect: Bone density results: NORMAL. Repeat every 5 years.- declined referral  Lung Cancer Screening: (Low Dose CT Chest recommended if Age 39-80 years, 30 pack-year currently smoking OR have quit w/in 15years.) does not qualify.     Additional Screening:  Hepatitis C Screening: does not qualify; Completed no  Vision Screening: Recommended annual ophthalmology exams for early detection of glaucoma and other disorders of the eye. Is the patient up to date with their annual eye exam?  Yes  Who is the provider or what is the name of the office in which the patient attends annual eye exams? [place] If pt is not established with a provider, would they like to be referred to a provider to establish care? No .   Dental Screening: Recommended annual dental exams for proper oral hygiene  Community Resource Referral / Chronic Care Management: CRR required this visit?  No   CCM required this visit?  No      Plan:     I have personally reviewed and noted the following in the patient's chart:   Medical and social history Use of alcohol, tobacco or illicit drugs  Current medications and supplements including opioid prescriptions. Patient is not currently taking opioid prescriptions. Functional ability and status Nutritional status Physical activity Advanced directives List of other physicians Hospitalizations, surgeries, and ER visits in previous 12 months Vitals Screenings to include cognitive, depression, and falls Referrals and appointments  In addition, I have reviewed and discussed with patient certain preventive protocols, quality metrics, and best practice recommendations. A written personalized care plan for preventive services as well as general preventive health recommendations were provided to patient.     Dionisio David, LPN   05/08/7260   Nurse Notes: none

## 2022-01-20 NOTE — Patient Instructions (Signed)
[name] , Thank you for taking time to come for your Medicare Wellness Visit. I appreciate your ongoing commitment to your health goals. Please review the following plan we discussed and let me know if I can assist you in the future.   Screening recommendations/referrals: Colonoscopy: aged out Mammogram: aged out Bone Density: 04/24/12 Recommended yearly ophthalmology/optometry visit for glaucoma screening and checkup Recommended yearly dental visit for hygiene and checkup  Vaccinations: Influenza vaccine: 03/22/21 Pneumococcal vaccine: 10/24/16 Tdap vaccine: 12/03/13 Shingles vaccine: Zostavax 04/24/10   Shingrix 12/04/18, 02/17/19   Covid-19:05/14/19, 06/07/19, 02/25/20  Advanced directives: no  Conditions/risks identified: none  Next appointment: Follow up in one year for your annual wellness visit 01/26/23 @ 9:15 am in person   Preventive Care 57 Years and Older, Female Preventive care refers to lifestyle choices and visits with your health care provider that can promote health and wellness. What does preventive care include? A yearly physical exam. This is also called an annual well check. Dental exams once or twice a year. Routine eye exams. Ask your health care provider how often you should have your eyes checked. Personal lifestyle choices, including: Daily care of your teeth and gums. Regular physical activity. Eating a healthy diet. Avoiding tobacco and drug use. Limiting alcohol use. Practicing safe sex. Taking low-dose aspirin every day. Taking vitamin and mineral supplements as recommended by your health care provider. What happens during an annual well check? The services and screenings done by your health care provider during your annual well check will depend on your age, overall health, lifestyle risk factors, and family history of disease. Counseling  Your health care provider may ask you questions about your: Alcohol use. Tobacco use. Drug use. Emotional  well-being. Home and relationship well-being. Sexual activity. Eating habits. History of falls. Memory and ability to understand (cognition). Work and work Statistician. Reproductive health. Screening  You may have the following tests or measurements: Height, weight, and BMI. Blood pressure. Lipid and cholesterol levels. These may be checked every 5 years, or more frequently if you are over 31 years old. Skin check. Lung cancer screening. You may have this screening every year starting at age 36 if you have a 30-pack-year history of smoking and currently smoke or have quit within the past 15 years. Fecal occult blood test (FOBT) of the stool. You may have this test every year starting at age 36. Flexible sigmoidoscopy or colonoscopy. You may have a sigmoidoscopy every 5 years or a colonoscopy every 10 years starting at age 82. Hepatitis C blood test. Hepatitis B blood test. Sexually transmitted disease (STD) testing. Diabetes screening. This is done by checking your blood sugar (glucose) after you have not eaten for a while (fasting). You may have this done every 1-3 years. Bone density scan. This is done to screen for osteoporosis. You may have this done starting at age 21. Mammogram. This may be done every 1-2 years. Talk to your health care provider about how often you should have regular mammograms. Talk with your health care provider about your test results, treatment options, and if necessary, the need for more tests. Vaccines  Your health care provider may recommend certain vaccines, such as: Influenza vaccine. This is recommended every year. Tetanus, diphtheria, and acellular pertussis (Tdap, Td) vaccine. You may need a Td booster every 10 years. Zoster vaccine. You may need this after age 3. Pneumococcal 13-valent conjugate (PCV13) vaccine. One dose is recommended after age 10. Pneumococcal polysaccharide (PPSV23) vaccine. One dose is recommended  after age 32. Talk to your  health care provider about which screenings and vaccines you need and how often you need them. This information is not intended to replace advice given to you by your health care provider. Make sure you discuss any questions you have with your health care provider. Document Released: 05/07/2015 Document Revised: 12/29/2015 Document Reviewed: 02/09/2015 Elsevier Interactive Patient Education  2017 [place] Prevention in the Home Falls can cause injuries. They can happen to people of all ages. There are many things you can do to make your home safe and to help prevent falls. What can I do on the outside of my home? Regularly fix the edges of walkways and driveways and fix any cracks. Remove anything that might make you trip as you walk through a door, such as a raised step or threshold. Trim any bushes or trees on the path to your home. Use bright outdoor lighting. Clear any walking paths of anything that might make someone trip, such as rocks or tools. Regularly check to see if handrails are loose or broken. Make sure that both sides of any steps have handrails. Any raised decks and porches should have guardrails on the edges. Have any leaves, snow, or ice cleared regularly. Use sand or salt on walking paths during winter. Clean up any spills in your garage right away. This includes oil or grease spills. What can I do in the bathroom? Use night lights. Install grab bars by the toilet and in the tub and shower. Do not use towel bars as grab bars. Use non-skid mats or decals in the tub or shower. If you need to sit down in the shower, use a plastic, non-slip stool. Keep the floor dry. Clean up any water that spills on the floor as soon as it happens. Remove soap buildup in the tub or shower regularly. Attach bath mats securely with double-sided non-slip rug tape. Do not have throw rugs and other things on the floor that can make you trip. What can I do in the bedroom? Use night  lights. Make sure that you have a light by your bed that is easy to reach. Do not use any sheets or blankets that are too big for your bed. They should not hang down onto the floor. Have a firm chair that has side arms. You can use this for support while you get dressed. Do not have throw rugs and other things on the floor that can make you trip. What can I do in the kitchen? Clean up any spills right away. Avoid walking on wet floors. Keep items that you use a lot in easy-to-reach places. If you need to reach something above you, use a strong step stool that has a grab bar. Keep electrical cords out of the way. Do not use floor polish or wax that makes floors slippery. If you must use wax, use non-skid floor wax. Do not have throw rugs and other things on the floor that can make you trip. What can I do with my stairs? Do not leave any items on the stairs. Make sure that there are handrails on both sides of the stairs and use them. Fix handrails that are broken or loose. Make sure that handrails are as long as the stairways. Check any carpeting to make sure that it is firmly attached to the stairs. Fix any carpet that is loose or worn. Avoid having throw rugs at the top or bottom of the stairs. If you  do have throw rugs, attach them to the floor with carpet tape. Make sure that you have a light switch at the top of the stairs and the bottom of the stairs. If you do not have them, ask someone to add them for you. What else can I do to help prevent falls? Wear shoes that: Do not have high heels. Have rubber bottoms. Are comfortable and fit you well. Are closed at the toe. Do not wear sandals. If you use a stepladder: Make sure that it is fully opened. Do not climb a closed stepladder. Make sure that both sides of the stepladder are locked into place. Ask someone to hold it for you, if possible. Clearly mark and make sure that you can see: Any grab bars or handrails. First and last  steps. Where the edge of each step is. Use tools that help you move around (mobility aids) if they are needed. These include: Canes. Walkers. Scooters. Crutches. Turn on the lights when you go into a dark area. Replace any light bulbs as soon as they burn out. Set up your furniture so you have a clear path. Avoid moving your furniture around. If any of your floors are uneven, fix them. If there are any pets around you, be aware of where they are. Review your medicines with your doctor. Some medicines can make you feel dizzy. This can increase your chance of falling. Ask your doctor what other things that you can do to help prevent falls. This information is not intended to replace advice given to you by your health care provider. Make sure you discuss any questions you have with your health care provider. Document Released: 02/04/2009 Document Revised: 09/16/2015 Document Reviewed: 05/15/2014 Elsevier Interactive Patient Education  2017 [employer].

## 2022-02-07 DIAGNOSIS — H40003 Preglaucoma, unspecified, bilateral: Secondary | ICD-10-CM | POA: Diagnosis not present

## 2022-02-15 ENCOUNTER — Telehealth: Payer: Self-pay | Admitting: Family Medicine

## 2022-02-15 NOTE — Telephone Encounter (Signed)
I called the patient. She did not request Freestyle Libre from pharmacy. She does not meet criteria for this, insurance is unlikely to pay for it.  She is not sure why this request was made.  I will close the encounter  Nobie Putnam, [facility] 02/15/2022, 5:24 PM

## 2022-02-15 NOTE — Telephone Encounter (Unsigned)
Medication Refill - Medication: Freestyle libre  Has the patient contacted their pharmacy? Pharmacy called in directly (Agent: If no, request that the patient contact the pharmacy for the refill. If patient does not wish to contact the pharmacy document the reason why and proceed with request.) (Agent: If yes, when and what did the pharmacy advise?)pharmacy called in  Preferred Pharmacy (with phone number or street name):  Has the patient been seen for an appointment in the last year OR does the patient have an upcoming appointment? {yes [id number]}  Agent: Please be advised that RX refills may take up to 3 business days. We ask that you follow-up with your pharmacy.

## 2022-02-15 NOTE — Telephone Encounter (Signed)
Medication Refill - Medication: free style libre  Has the patient contacted their pharmacy? Pharmacy called in directly (Agent: If no, request that the patient contact the pharmacy for the refill. If patient does not wish to contact the pharmacy document the reason why and proceed with request.) (Agent: If yes, when and what did the pharmacy advise?)pharmacy called in  Preferred Pharmacy (with phone number or street name): [place],  [place] [address]  [phone] Has the patient been seen for an appointment in the last year OR does the patient have an upcoming appointment? yes  Agent: Please be advised that RX refills may take up to 3 business days. We ask that you follow-up with your pharmacy.

## 2022-03-27 ENCOUNTER — Other Ambulatory Visit: Payer: Self-pay

## 2022-03-27 DIAGNOSIS — I10 Essential (primary) hypertension: Secondary | ICD-10-CM

## 2022-03-27 DIAGNOSIS — E440 Moderate protein-calorie malnutrition: Secondary | ICD-10-CM

## 2022-03-27 DIAGNOSIS — Z0000 Encounter for general adult medical examination without abnormal findings: Secondary | ICD-10-CM

## 2022-03-27 DIAGNOSIS — E1169 Type 2 diabetes mellitus with other specified complication: Secondary | ICD-10-CM

## 2022-03-27 DIAGNOSIS — E7800 Pure hypercholesterolemia, unspecified: Secondary | ICD-10-CM

## 2022-03-27 DIAGNOSIS — E039 Hypothyroidism, unspecified: Secondary | ICD-10-CM

## 2022-03-28 ENCOUNTER — Other Ambulatory Visit: Payer: Medicare HMO

## 2022-03-28 DIAGNOSIS — E440 Moderate protein-calorie malnutrition: Secondary | ICD-10-CM | POA: Diagnosis not present

## 2022-03-28 DIAGNOSIS — I10 Essential (primary) hypertension: Secondary | ICD-10-CM | POA: Diagnosis not present

## 2022-03-28 DIAGNOSIS — E039 Hypothyroidism, unspecified: Secondary | ICD-10-CM | POA: Diagnosis not present

## 2022-03-28 DIAGNOSIS — E7800 Pure hypercholesterolemia, unspecified: Secondary | ICD-10-CM | POA: Diagnosis not present

## 2022-03-28 DIAGNOSIS — Z0000 Encounter for general adult medical examination without abnormal findings: Secondary | ICD-10-CM | POA: Diagnosis not present

## 2022-03-28 DIAGNOSIS — E1169 Type 2 diabetes mellitus with other specified complication: Secondary | ICD-10-CM | POA: Diagnosis not present

## 2022-03-29 LAB — CBC WITH DIFFERENTIAL/PLATELET
Absolute Monocytes: 513 cells/uL (ref 200–950)
Basophils Absolute: 29 cells/uL (ref 0–200)
Basophils Relative: 0.5 %
Eosinophils Absolute: 63 cells/uL (ref 15–500)
Eosinophils Relative: 1.1 %
HCT: 39.9 % (ref 35.0–45.0)
Hemoglobin: 13.2 g/dL (ref 11.7–15.5)
Lymphs Abs: 2103 cells/uL (ref 850–3900)
MCH: 31 pg (ref 27.0–33.0)
MCHC: 33.1 g/dL (ref 32.0–36.0)
MCV: 93.7 fL (ref 80.0–100.0)
MPV: 10.1 fL (ref 7.5–12.5)
Monocytes Relative: 9 %
Neutro Abs: 2993 cells/uL (ref 1500–7800)
Neutrophils Relative %: 52.5 %
Platelets: 266 10*3/uL (ref 140–400)
RBC: 4.26 10*6/uL (ref 3.80–5.10)
RDW: 11.7 % (ref 11.0–15.0)
Total Lymphocyte: 36.9 %
WBC: 5.7 10*3/uL (ref 3.8–10.8)

## 2022-03-29 LAB — COMPLETE METABOLIC PANEL WITH GFR
AG Ratio: 1.8 (calc) (ref 1.0–2.5)
ALT: 16 U/L (ref 6–29)
AST: 21 U/L (ref 10–35)
Albumin: 4.6 g/dL (ref 3.6–5.1)
Alkaline phosphatase (APISO): 65 U/L (ref 37–153)
BUN/Creatinine Ratio: 26 (calc) — ABNORMAL HIGH (ref 6–22)
BUN: 15 mg/dL (ref 7–25)
CO2: 28 mmol/L (ref 20–32)
Calcium: 10.3 mg/dL (ref 8.6–10.4)
Chloride: 99 mmol/L (ref 98–110)
Creat: 0.57 mg/dL — ABNORMAL LOW (ref 0.60–0.95)
Globulin: 2.6 g/dL (calc) (ref 1.9–3.7)
Glucose, Bld: 141 mg/dL — ABNORMAL HIGH (ref 65–99)
Potassium: 4.8 mmol/L (ref 3.5–5.3)
Sodium: 133 mmol/L — ABNORMAL LOW (ref 135–146)
Total Bilirubin: 0.6 mg/dL (ref 0.2–1.2)
Total Protein: 7.2 g/dL (ref 6.1–8.1)
eGFR: 90 mL/min/{1.73_m2} (ref >=60)

## 2022-03-29 LAB — LIPID PANEL
Cholesterol: 152 mg/dL (ref <200)
HDL: 70 mg/dL (ref >=50)
LDL Cholesterol (Calc): 68 mg/dL (calc)
Non-HDL Cholesterol (Calc): 82 mg/dL (calc) (ref <130)
Total CHOL/HDL Ratio: 2.2 (calc) (ref <5.0)
Triglycerides: 65 mg/dL (ref <150)

## 2022-03-29 LAB — HEMOGLOBIN A1C
Hgb A1c MFr Bld: 8 % of total Hgb — ABNORMAL HIGH (ref <5.7)
Mean Plasma Glucose: 183 mg/dL
eAG (mmol/L): 10.1 mmol/L

## 2022-03-29 LAB — TSH: TSH: 1.42 mIU/L (ref 0.40–4.50)

## 2022-03-29 LAB — T4, FREE: Free T4: 1.6 ng/dL (ref 0.8–1.8)

## 2022-04-04 ENCOUNTER — Encounter: Payer: Self-pay | Admitting: Family Medicine

## 2022-04-04 ENCOUNTER — Ambulatory Visit (INDEPENDENT_AMBULATORY_CARE_PROVIDER_SITE_OTHER): Payer: Medicare HMO | Admitting: Family Medicine

## 2022-04-04 VITALS — BP 142/64 | HR 62 | Ht 61.0 in | Wt 91.6 lb

## 2022-04-04 DIAGNOSIS — E1169 Type 2 diabetes mellitus with other specified complication: Secondary | ICD-10-CM | POA: Diagnosis not present

## 2022-04-04 DIAGNOSIS — E7800 Pure hypercholesterolemia, unspecified: Secondary | ICD-10-CM | POA: Diagnosis not present

## 2022-04-04 DIAGNOSIS — Z0000 Encounter for general adult medical examination without abnormal findings: Secondary | ICD-10-CM

## 2022-04-04 DIAGNOSIS — I10 Essential (primary) hypertension: Secondary | ICD-10-CM | POA: Diagnosis not present

## 2022-04-04 DIAGNOSIS — E039 Hypothyroidism, unspecified: Secondary | ICD-10-CM | POA: Diagnosis not present

## 2022-04-04 DIAGNOSIS — Z23 Encounter for immunization: Secondary | ICD-10-CM | POA: Diagnosis not present

## 2022-04-04 DIAGNOSIS — E440 Moderate protein-calorie malnutrition: Secondary | ICD-10-CM | POA: Diagnosis not present

## 2022-04-04 MED ORDER — LEVOTHYROXINE SODIUM 50 MCG PO TABS: 50.0000 ug | ORAL_TABLET | Freq: Every day | ORAL | 3 refills | Status: DC

## 2022-04-04 MED ORDER — ONETOUCH VERIO IQ SYSTEM W/DEVICE KIT: PACK | 0 refills | Status: DC

## 2022-04-04 NOTE — Progress Notes (Signed)
Subjective:    Patient ID: Barbara Santos, female    DOB: 1935/05/31, 86 y.o.   MRN: [id number]  Barbara Santos is a 86 y.o. female presenting on 04/04/2022 for Annual Exam   HPI  Here for Annual Physical and Lab Review  Home BP readings 130-142 / 55-70  CHRONIC DM, Type 2: BMI >17 moderate protein calorie malnutrition - Today reports doing well on medicine overall CBGs: Avg 150-170,  needs new onetouch verio IQ some readings higher but seems < 170 now. detailed CBG log with her. Meds: Metformin XR 533m BID Currently on ARB Lifestyle: Weight gain 2 lbs in 6 months - Diet - often eating baked chicken and higher protein supplement drinks. 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   CHRONIC HTN: BP remains stable at home. K 4.8 stable Current Meds - Valsartan 418mdaily   Reports good compliance, took meds today. Tolerating well, w/o complaints. Lifestyle: - Diet: Limited sodium diet now, avoid salt added. Limited meats Admits mild HA in AM self limited Denies CP, dyspnea, edema, dizziness / lightheadedness   HYPERLIPIDEMIA: - Reports no concerns. Last lipid panel 03/2022, controlled  - Currently taking Rosuvastatin 29m60mtolerating well without side effects or myalgias  Hypothyroidism Controlled on labs, Continues Levothyroxine dose 67m1maily.  Health Maintenance: Flu Shot today     04/04/2022    8:38 AM 01/20/2022    9:32 AM 01/11/2021    2:41 PM  Depression screen PHQ 2/9  Decreased Interest 0 0 0  Down, Depressed, Hopeless 0 1 0  PHQ - 2 Score 0 1 0  Altered sleeping 0 0   Tired, decreased energy 0 0   Change in appetite 0 0   Feeling bad or failure about yourself  0 0   Trouble concentrating 0 0   Moving slowly or  fidgety/restless 0 0   Suicidal thoughts 0 0   PHQ-9 Score 0 1   Difficult doing work/chores Not difficult at all Not difficult at all     Past Medical History:  Diagnosis Date   Arthritis    Basal cell carcinoma 08/23/2009   Right dorsum lat. distal forearm.    Basal cell carcinoma 06/29/2021   R forehead - tx with ED&C   Diabetes mellitus, type 2 (HCC)[place] Dysplastic nevus 08/03/2008   Right ant. thigh, just above knee. Moderate to marked atypia, extends to base. Excised 09/23/2008, margins free.   Dysplastic nevus 08/23/2009   Right post. sup. thigh. MIld atypia, limited margins free.   Glaucoma    Hx of basal cell carcinoma    R cheek   Hx of squamous cell carcinoma of skin 11/01/2009   L medial mid calf   Hyperlipidemia    Hypothyroidism    Insomnia    Left hip pain    Protein calorie malnutrition (HCC)    Squamous cell carcinoma of skin 08/23/2009   Left medial mid calf. SCCis   Past Surgical History:  Procedure Laterality Date   CATARACT EXTRACTION W/PHACO Left 12/31/2019   Procedure: CATARACT EXTRACTION PHACO AND INTRAOCULAR LENS PLACEMENT (IOC) LEFT DIABETIC 10.20  01:17.6  13.1%;  Surgeon: BrasLeandrew Koyanagi;  [place];  Laterality: Left;  Diabetic - oral meds  CATARACT EXTRACTION W/PHACO Right 01/28/2020   Procedure: CATARACT EXTRACTION PHACO AND INTRAOCULAR LENS PLACEMENT (IOC) RIGHT DIABETIC 13.67 01:34.5 14.5%;  Surgeon: Leandrew Koyanagi, MD;  Location: [place];  Service: Ophthalmology;  Laterality: Right;   COLONOSCOPY WITH PROPOFOL N/A 12/31/2017   Procedure: COLONOSCOPY WITH PROPOFOL;  Surgeon: Manya Silvas, MD;  [facility] ENDOSCOPY;  Service: Endoscopy;  Laterality: N/A;   HERNIA REPAIR     TOTAL ABDOMINAL HYSTERECTOMY  2009   Social History   Socioeconomic History   Marital status: Widowed    Spouse name: Not on file   Number of children: Not on file   Years of education: Not on  file   Highest education level: High school graduate  Occupational History   Not on file  Tobacco Use   Smoking status: Never   Smokeless tobacco: Never  Vaping Use   Vaping Use: Never used  Substance and Sexual Activity   Alcohol use: No    Alcohol/week: 0.0 standard drinks of alcohol   Drug use: No   Sexual activity: Not on file  Other Topics Concern   Not on file  Social History Narrative   Not on file   Social Determinants of Health   Financial Resource Strain: Low Risk  (01/20/2022)   Overall Financial Resource Strain (CARDIA)    Difficulty of Paying Living Expenses: Not hard at all  Food Insecurity: No Food Insecurity (01/20/2022)   Hunger Vital Sign    Worried About Running Out of Food in the Last Year: Never true    [place] in the Last Year: Never true  Transportation Needs: No Transportation Needs (01/20/2022)   PRAPARE - Hydrologist (Medical): No    Lack of Transportation (Non-Medical): No  Physical Activity: Insufficiently Active (01/20/2022)   Exercise Vital Sign    Days of Exercise per Week: 3 days    Minutes of Exercise per Session: 30 min  Stress: No Stress Concern Present (01/20/2022)   [place]    Feeling of Stress : Not at all  Social Connections: Moderately Isolated (01/20/2022)   Social Connection and Isolation Panel [NHANES]    Frequency of Communication with Friends and Family: More than three times a week    Frequency of Social Gatherings with Friends and Family: More than three times a week    Attends Religious Services: More than 4 times per year    Active Member of [employer] or Organizations: No    Attends Archivist Meetings: Never    Marital Status: Widowed  Intimate Partner Violence: Not At Risk (01/20/2022)   Humiliation, Afraid, Rape, and Kick questionnaire    Fear of Current or Ex-Partner: No    Emotionally Abused: No    Physically  Abused: No    Sexually Abused: No   Family History  Problem Relation Age of Onset   Diabetes Mellitus II Mother    Stroke Father    Breast cancer Sister 57   Breast cancer Sister    Stroke Maternal Grandmother    Heart disease Paternal Grandfather    Current Outpatient Medications on File Prior to Visit  Medication Sig   Cholecalciferol (VITAMIN D) 2000 UNITS tablet Take 2,000 Units by mouth daily.   diphenhydrAMINE (BENADRYL) 25 MG tablet Take 25 mg by mouth every 6 (six) hours as needed.   dorzolamide-timolol (COSOPT) 22.3-6.8 MG/ML ophthalmic solution Place 1 drop into both eyes 2 (  two) times daily.    ketoconazole (NIZORAL) 2 % cream Apply 1 application topically at bedtime. Apply to feet   lactose free nutrition (BOOST) LIQD Take 237 mLs by mouth 3 (three) times daily between meals.   metFORMIN (GLUCOPHAGE-XR) 500 MG 24 hr tablet Take 1 tablet (500 mg total) by mouth 2 (two) times daily with a meal.   Multiple Vitamin (MULTIVITAMIN) tablet Take 1 tablet by mouth daily.   ONETOUCH DELICA LANCETS FINE MISC 1 each by Other route 3 (three) times daily.   ONETOUCH VERIO test strip CHECK BLOOD SUGAR UP TO ONCE DAILY   rosuvastatin (CRESTOR) 5 MG tablet Take 1 tablet (5 mg total) by mouth at bedtime.   valsartan (DIOVAN) 40 MG tablet Take 1 tablet (40 mg total) by mouth daily.   Nutritional Supplements (JUICE PLUS FIBRE PO) Take by mouth. (Patient not taking: Reported on 01/20/2022)   No current facility-administered medications on file prior to visit.    Review of Systems  Constitutional:  Negative for activity change, appetite change, chills, diaphoresis, fatigue and fever.  HENT:  Negative for congestion and hearing loss.   Eyes:  Negative for visual disturbance.  Respiratory:  Negative for cough, chest tightness, shortness of breath and wheezing.   Cardiovascular:  Negative for chest pain, palpitations and leg swelling.  Gastrointestinal:  Negative for abdominal pain,  constipation, diarrhea, nausea and vomiting.  Genitourinary:  Negative for dysuria, frequency and hematuria.  Musculoskeletal:  Negative for arthralgias and neck pain.  Skin:  Negative for rash.  Neurological:  Negative for dizziness, weakness, light-headedness, numbness and headaches.  Hematological:  Negative for adenopathy.  Psychiatric/Behavioral:  Negative for behavioral problems, dysphoric mood and sleep disturbance.    Per HPI unless specifically indicated above     Objective:    BP (!) 142/64 (BP Location: Left Arm, Cuff Size: Normal)   Pulse 62   Ht _0  (1.549 m)   Wt 91 lb 9.6 oz (41.5 kg)   SpO2 100%   BMI 17.31 kg/m   Wt Readings from Last 3 Encounters:  04/04/22 91 lb 9.6 oz (41.5 kg)  01/20/22 90 lb 6.4 oz (41 kg)  09/27/21 89 lb 9.6 oz (40.6 kg)    Physical Exam Vitals and nursing note reviewed.  Constitutional:      General: She is not in acute distress.    Appearance: She is well-developed. She is not diaphoretic.     Comments: Well-appearing, comfortable, cooperative  HENT:     Head: Normocephalic and atraumatic.  Eyes:     General:        Right eye: No discharge.        Left eye: No discharge.     Conjunctiva/sclera: Conjunctivae normal.     Pupils: Pupils are equal, round, and reactive to light.  Neck:     Thyroid: No thyromegaly.     Vascular: No carotid bruit.  Cardiovascular:     Rate and Rhythm: Normal rate and regular rhythm.     Pulses: Normal pulses.     Heart sounds: Normal heart sounds. No murmur heard. Pulmonary:     Effort: Pulmonary effort is normal. No respiratory distress.     Breath sounds: Normal breath sounds. No wheezing or rales.  Abdominal:     General: Bowel sounds are normal. There is no distension.     Palpations: Abdomen is soft. There is no mass.     Tenderness: There is no abdominal tenderness.  Musculoskeletal:  General: No tenderness. Normal range of motion.     Cervical back: Normal range of motion and  neck supple.     Right lower leg: No edema.     Left lower leg: No edema.     Comments: Upper / Lower Extremities: - Normal muscle tone, strength bilateral upper extremities 5/5, lower extremities 5/5  Lymphadenopathy:     Cervical: No cervical adenopathy.  Skin:    General: Skin is warm and dry.     Findings: No erythema or rash.  Neurological:     Mental Status: She is alert and oriented to person, place, and time.     Comments: Distal sensation intact to light touch all extremities  Psychiatric:        Mood and Affect: Mood normal.        Behavior: Behavior normal.        Thought Content: Thought content normal.     Comments: Well groomed, good eye contact, normal speech and thoughts     Diabetic Foot Exam - Simple   Simple Foot Form Diabetic Foot exam was performed with the following findings: Yes 04/04/2022  9:20 AM  Visual Inspection See comments: Yes Sensation Testing Intact to touch and monofilament testing bilaterally: Yes Pulse Check Posterior Tibialis and Dorsalis pulse intact bilaterally: Yes Comments Thickened toenails great toe onychomycosis. No ulceration or erythema. Intact monofilament.      Results for orders placed or performed in visit on 03/27/22  T4, free  Result Value Ref Range   Free T4 1.6 0.8 - 1.8 ng/dL  TSH  Result Value Ref Range   TSH 1.42 0.40 - 4.50 mIU/L  Hemoglobin A1c  Result Value Ref Range   Hgb A1c MFr Bld 8.0 (H) <5.7 % of total Hgb   Mean Plasma Glucose 183 mg/dL   eAG (mmol/L) 10.1 mmol/L  Lipid panel  Result Value Ref Range   Cholesterol 152 <200 mg/dL   HDL 70 > OR = 50 mg/dL   Triglycerides 65 <150 mg/dL   LDL Cholesterol (Calc) 68 mg/dL (calc)   Total CHOL/HDL Ratio 2.2 <5.0 (calc)   Non-HDL Cholesterol (Calc) 82 <130 mg/dL (calc)  CBC with Differential/Platelet  Result Value Ref Range   WBC 5.7 3.8 - 10.8 Thousand/uL   RBC 4.26 3.80 - 5.10 Million/uL   Hemoglobin 13.2 11.7 - 15.5 g/dL   HCT 39.9 35.0 - 45.0 %    MCV 93.7 80.0 - 100.0 fL   MCH 31.0 27.0 - 33.0 pg   MCHC 33.1 32.0 - 36.0 g/dL   RDW 11.7 11.0 - 15.0 %   Platelets 266 140 - 400 Thousand/uL   MPV 10.1 7.5 - 12.5 fL   Neutro Abs 2,993 1,500 - 7,800 cells/uL   Lymphs Abs 2,103 850 - 3,900 cells/uL   Absolute Monocytes 513 200 - 950 cells/uL   Eosinophils Absolute 63 15 - 500 cells/uL   Basophils Absolute 29 0 - 200 cells/uL   Neutrophils Relative % 52.5 %   Total Lymphocyte 36.9 %   Monocytes Relative 9.0 %   Eosinophils Relative 1.1 %   Basophils Relative 0.5 %  COMPLETE METABOLIC PANEL WITH GFR  Result Value Ref Range   Glucose, Bld 141 (H) 65 - 99 mg/dL   BUN 15 7 - 25 mg/dL   Creat 0.57 (L) 0.60 - 0.95 mg/dL   eGFR 90 > OR = 60 mL/min/1.92m   BUN/Creatinine Ratio 26 (H) 6 - 22 (calc)   Sodium 133 (L)  135 - 146 mmol/L   Potassium 4.8 3.5 - 5.3 mmol/L   Chloride 99 98 - 110 mmol/L   CO2 28 20 - 32 mmol/L   Calcium 10.3 8.6 - 10.4 mg/dL   Total Protein 7.2 6.1 - 8.1 g/dL   Albumin 4.6 3.6 - 5.1 g/dL   Globulin 2.6 1.9 - 3.7 g/dL (calc)   AG Ratio 1.8 1.0 - 2.5 (calc)   Total Bilirubin 0.6 0.2 - 1.2 mg/dL   Alkaline phosphatase (APISO) 65 37 - 153 U/L   AST 21 10 - 35 U/L   ALT 16 6 - 29 U/L      Assessment & Plan:   Problem List Items Addressed This Visit     Elevated LDL cholesterol level    Controlled cholesterol on statin and lifestyle The ASCVD Risk score (Arnett DK, et al., 2019) failed to calculate for the following reasons:   The 2019 ASCVD risk score is only valid for ages 55 to 68   Plan: 1. Continue Rosuvastatin 37m nightly - Defer ASA - history of easy bleeding on trial years ago - Follow-up yearly lipids      Essential hypertension    Well-controlled HYPERTENSION, BP repeat on manual cuff improved - Home BP readings reviewed, normal, has cuff 130-140 avg No known complications    Plan:  1.  Continue current BP regimen Valsartan 448mdaily 2. Encourage improved lifestyle - low sodium  diet, regular exercise 3. Continue monitor BP outside office, bring readings to next visit, if persistently >140/90 or new symptoms notify office sooner      Hypothyroidism    Stable, controlled hypothyroidism Continue Levothyroxine 5032mdaily      Relevant Medications   levothyroxine (SYNTHROID) 50 MCG tablet   Protein calorie malnutrition (HCC)    Weight up 2 lbs in 6 months, stable      Type 2 diabetes mellitus with other specified complication (HCC)    A1cV7S to 8.0 but she has gained a few lbs weight which has been an improvement. Complicated by hypothyroidism and hyperlipidemia  Plan:  Continue Metformin XR 500 BID Encourage improved lifestyle -and acceptable to allow A1c / CBGs to increase to 7+ range Check CBG, bring log to next visit for review Continue ARB Continue Statin DIABETES Foot exam, Eye Exam updates      Relevant Medications   Blood Glucose Monitoring Suppl (ONETOUCH VERIO IQ SYSTEM) w/Device KIT   Other Visit Diagnoses     Annual physical exam    -  Primary   Needs flu shot       Relevant Orders   Flu Vaccine QUAD High Dose(Fluad) (Completed)       Updated Health Maintenance information Reviewed recent lab results with patient Encouraged improvement to lifestyle with diet and exercise Goal of weight loss  Re order glucometer same brand.  Orders Placed This Encounter  Procedures   Flu Vaccine QUAD High Dose(Fluad)      Meds ordered this encounter  Medications   Blood Glucose Monitoring Suppl (ONETOUCH VERIO IQ SYSTEM) w/Device KIT    Sig: Use to check blood sugar up to 2 x daily    Dispense:  1 kit    Refill:  0    E11.69   levothyroxine (SYNTHROID) 50 MCG tablet    Sig: Take 1 tablet (50 mcg total) by mouth daily before breakfast.    Dispense:  90 tablet    Refill:  3      Follow up  plan: Return in about 6 months (around 10/04/2022) for 6 month DM A1c HTN.  Nobie Putnam, [facility] 04/04/2022, 9:13 AM

## 2022-04-04 NOTE — Assessment & Plan Note (Signed)
A1c up to 8.0 but she has gained a few lbs weight which has been an improvement. Complicated by hypothyroidism and hyperlipidemia  Plan:  Continue Metformin XR 500 BID Encourage improved lifestyle -and acceptable to allow A1c / CBGs to increase to 7+ range Check CBG, bring log to next visit for review Continue ARB Continue Statin DIABETES Foot exam, Eye Exam updates

## 2022-04-04 NOTE — Assessment & Plan Note (Signed)
Controlled cholesterol on statin and lifestyle The ASCVD Risk score (Arnett DK, et al., 2019) failed to calculate for the following reasons:   The 2019 ASCVD risk score is only valid for ages 23 to 96   Plan: 1. Continue Rosuvastatin '5mg'$  nightly - Defer ASA - history of easy bleeding on trial years ago - Follow-up yearly lipids

## 2022-04-04 NOTE — Patient Instructions (Addendum)
Thank you for coming to the office today.  Flu Shot today  Ordered new glucometer, [employer] IQ Use same test strips, let me know if need more  Re ordered Levothyroxine  Recent Labs    09/27/21 1007 03/28/22 0814  HGBA1C 7.0* 8.0*     Please schedule a Follow-up Appointment to: Return in about 6 months (around 10/04/2022) for 6 month DM A1c HTN.  If you have any other questions or concerns, please feel free to call the office or send a message through [facility]. You may also schedule an earlier appointment if necessary.  Additionally, you may be receiving a survey about your experience at our office within a few days to 1 week by e-mail or mail. We value your feedback.  Nobie Putnam, DO [place]

## 2022-04-04 NOTE — Assessment & Plan Note (Signed)
Well-controlled HYPERTENSION, BP repeat on manual cuff improved - Home BP readings reviewed, normal, has cuff 130-140 avg No known complications    Plan:  1.  Continue current BP regimen Valsartan '40mg'$  daily 2. Encourage improved lifestyle - low sodium diet, regular exercise 3. Continue monitor BP outside office, bring readings to next visit, if persistently >140/90 or new symptoms notify office sooner

## 2022-04-04 NOTE — Assessment & Plan Note (Addendum)
Weight up 2 lbs in 6 months, stable

## 2022-04-04 NOTE — Assessment & Plan Note (Signed)
Stable, controlled hypothyroidism Continue Levothyroxine 75mg daily

## 2022-06-02 ENCOUNTER — Other Ambulatory Visit: Payer: Self-pay | Admitting: Family Medicine

## 2022-06-02 DIAGNOSIS — E119 Type 2 diabetes mellitus without complications: Secondary | ICD-10-CM

## 2022-06-02 NOTE — Telephone Encounter (Signed)
Requested Prescriptions  Pending Prescriptions Disp Refills   ONETOUCH VERIO test strip [Pharmacy Med Name: [place] TEST STRIP] 100 strip 1    Sig: CHECK BLOOD SUGAR UP TO ONCE DAILY     Endocrinology: Diabetes - Testing Supplies Passed - 06/02/2022  9:42 AM      Passed - Valid encounter within last 12 months    Recent Outpatient Visits           1 month ago Annual physical exam   [facility] [place], Devonne Doughty, DO   8 months ago Type 2 diabetes mellitus with other specified complication, without long-term current use of insulin [facility])   [place], DO   1 year ago Annual physical exam   [facility] Olin Hauser, DO   1 year ago Type 2 diabetes mellitus with other specified complication, without long-term current use of insulin [facility])   [facility] [place], Devonne Doughty, DO   1 year ago Type 2 diabetes mellitus with other specified complication, without long-term current use of insulin [facility])   [place], DO       Future Appointments             In 1 month Nehemiah Massed Monia Sabal, MD [place]   In 4 months [place], Devonne Doughty, [facility], [facility]

## 2022-06-20 ENCOUNTER — Other Ambulatory Visit: Payer: Self-pay | Admitting: Family Medicine

## 2022-06-20 DIAGNOSIS — E039 Hypothyroidism, unspecified: Secondary | ICD-10-CM

## 2022-06-20 NOTE — Telephone Encounter (Signed)
Requested Prescriptions  Pending Prescriptions Disp Refills   levothyroxine (SYNTHROID) 50 MCG tablet [Pharmacy Med Name: LEVOTHYROXIN TAB 50MCG] 90 tablet 3    Sig: TAKE 1 TABLET DAILY BEFORE BREAKFAST     Endocrinology:  Hypothyroid Agents Passed - 06/20/2022  3:16 AM      Passed - TSH in normal range and within 360 days    TSH  Date Value Ref Range Status  03/28/2022 1.42 0.40 - 4.50 mIU/L Final         Passed - Valid encounter within last 12 months    Recent Outpatient Visits           2 months ago Annual physical exam   [facility] [place], Devonne Doughty, DO   8 months ago Type 2 diabetes mellitus with other specified complication, without long-term current use of insulin [facility])   [place], DO   1 year ago Annual physical exam   [facility] Olin Hauser, DO   1 year ago Type 2 diabetes mellitus with other specified complication, without long-term current use of insulin [facility])   [place], DO   1 year ago Type 2 diabetes mellitus with other specified complication, without long-term current use of insulin [facility])   [place], DO       Future Appointments             In 1 week Ralene Bathe, MD [place]   In 3 months [place], Devonne Doughty, [facility]

## 2022-07-03 ENCOUNTER — Ambulatory Visit: Payer: Medicare HMO | Admitting: Dermatology

## 2022-07-03 ENCOUNTER — Encounter: Payer: Self-pay | Admitting: Dermatology

## 2022-07-03 VITALS — BP 179/88

## 2022-07-03 DIAGNOSIS — Z85828 Personal history of other malignant neoplasm of skin: Secondary | ICD-10-CM

## 2022-07-03 DIAGNOSIS — L570 Actinic keratosis: Secondary | ICD-10-CM

## 2022-07-03 DIAGNOSIS — H61001 Unspecified perichondritis of right external ear: Secondary | ICD-10-CM

## 2022-07-03 DIAGNOSIS — L820 Inflamed seborrheic keratosis: Secondary | ICD-10-CM

## 2022-07-03 DIAGNOSIS — Z79899 Other long term (current) drug therapy: Secondary | ICD-10-CM

## 2022-07-03 DIAGNOSIS — L821 Other seborrheic keratosis: Secondary | ICD-10-CM | POA: Diagnosis not present

## 2022-07-03 DIAGNOSIS — Z1283 Encounter for screening for malignant neoplasm of skin: Secondary | ICD-10-CM

## 2022-07-03 DIAGNOSIS — L814 Other melanin hyperpigmentation: Secondary | ICD-10-CM

## 2022-07-03 DIAGNOSIS — B353 Tinea pedis: Secondary | ICD-10-CM

## 2022-07-03 DIAGNOSIS — B351 Tinea unguium: Secondary | ICD-10-CM

## 2022-07-03 DIAGNOSIS — Z8589 Personal history of malignant neoplasm of other organs and systems: Secondary | ICD-10-CM

## 2022-07-03 DIAGNOSIS — L578 Other skin changes due to chronic exposure to nonionizing radiation: Secondary | ICD-10-CM | POA: Diagnosis not present

## 2022-07-03 DIAGNOSIS — D229 Melanocytic nevi, unspecified: Secondary | ICD-10-CM

## 2022-07-03 DIAGNOSIS — Z86018 Personal history of other benign neoplasm: Secondary | ICD-10-CM

## 2022-07-03 MED ORDER — KETOCONAZOLE 2 % EX CREA: 1.0000 | TOPICAL_CREAM | Freq: Every day | CUTANEOUS | 11 refills | Status: DC

## 2022-07-03 NOTE — Progress Notes (Signed)
Follow-Up Visit   Subjective  Barbara Santos is a 87 y.o. female who presents for the following: Annual Exam (History of BCC, SCC and dysplastic nevi - The patient presents for Total-Body Skin Exam (TBSE) for skin cancer screening and mole check.  The patient has spots, moles and lesions to be evaluated, some may be new or changing and the patient has concerns that these could be cancer./).  The following portions of the chart were reviewed this encounter and updated as appropriate:   Tobacco  Allergies  Meds  Problems  Med Hx  Surg Hx  Fam Hx     Review of Systems:  No other skin or systemic complaints except as noted in HPI or Assessment and Plan.  Objective  Well appearing patient in no apparent distress; mood and affect are within normal limits.  A full examination was performed including scalp, head, eyes, ears, nose, lips, neck, chest, axillae, abdomen, back, buttocks, bilateral upper extremities, bilateral lower extremities, hands, feet, fingers, toes, fingernails, and toenails. All findings within normal limits unless otherwise noted below.  Right scalp x 1, right forehead x 1 (2) Erythematous stuck-on, waxy papule or plaque  Right Ear sup helix x 1, left zygoma x 1, right nasolabial x 1 (3) Erythematous thin papules/macules with gritty scale.   Right Ear Flesh colored papule  Bilateral feet Toenail dystrophy and scale   Assessment & Plan   History of Squamous Cell Carcinoma of the Skin - No evidence of recurrence today - No lymphadenopathy - Recommend regular full body skin exams - Recommend daily broad spectrum sunscreen SPF 30+ to sun-exposed areas, reapply every 2 hours as needed.  - Call if any new or changing lesions are noted between office visits  History of Basal Cell Carcinoma of the Skin - No evidence of recurrence today - Recommend regular full body skin exams - Recommend daily broad spectrum sunscreen SPF 30+ to sun-exposed areas, reapply  every 2 hours as needed.  - Call if any new or changing lesions are noted between office visits  History of Dysplastic Nevi - No evidence of recurrence today - Recommend regular full body skin exams - Recommend daily broad spectrum sunscreen SPF 30+ to sun-exposed areas, reapply every 2 hours as needed.  - Call if any new or changing lesions are noted between office visits  Lentigines - Scattered tan macules - Due to sun exposure - Benign-appearing, observe - Recommend daily broad spectrum sunscreen SPF 30+ to sun-exposed areas, reapply every 2 hours as needed. - Call for any changes  Seborrheic Keratoses - Stuck-on, waxy, tan-brown papules and/or plaques  - Benign-appearing - Discussed benign etiology and prognosis. - Observe - Call for any changes  Melanocytic Nevi - Tan-brown and/or pink-flesh-colored symmetric macules and papules - Benign appearing on exam today - Observation - Call clinic for new or changing moles - Recommend daily use of broad spectrum spf 30+ sunscreen to sun-exposed areas.   Hemangiomas - Red papules - Discussed benign nature - Observe - Call for any changes  Actinic Damage - Chronic condition, secondary to cumulative UV/sun exposure - diffuse scaly erythematous macules with underlying dyspigmentation - Recommend daily broad spectrum sunscreen SPF 30+ to sun-exposed areas, reapply every 2 hours as needed.  - Staying in the shade or wearing long sleeves, sun glasses (UVA+UVB protection) and wide brim hats (4-inch brim around the entire circumference of the hat) are also recommended for sun protection.  - Call for new or changing lesions.  Skin cancer screening performed today.  Inflamed seborrheic keratosis (2) Right scalp x 1, right forehead x 1 Destruction of lesion - Right scalp x 1, right forehead x 1 Complexity: simple   Destruction method: cryotherapy   Informed consent: discussed and consent obtained   Timeout:  patient name, date of  birth, surgical site, and procedure verified Lesion destroyed using liquid nitrogen: Yes   Region frozen until ice ball extended beyond lesion: Yes   Outcome: patient tolerated procedure well with no complications   Post-procedure details: wound care instructions given    AK (actinic keratosis) (3) Right Ear sup helix x 1, left zygoma x 1, right nasolabial x 1 Destruction of lesion - Right Ear sup helix x 1, left zygoma x 1, right nasolabial x 1 Complexity: simple   Destruction method: cryotherapy   Informed consent: discussed and consent obtained   Timeout:  patient name, date of birth, surgical site, and procedure verified Lesion destroyed using liquid nitrogen: Yes   Region frozen until ice ball extended beyond lesion: Yes   Outcome: patient tolerated procedure well with no complications   Post-procedure details: wound care instructions given    Chondrodermatitis nodularis helicis of right ear Right Ear Benign-appearing.  Observation.  Call clinic for new or changing lesions.  Recommend daily use of broad spectrum spf 30+ sunscreen to sun-exposed areas.   Tinea pedis of both feet Bilateral feet Tinea pedis and unguium - Chronic and persistent condition with duration or expected duration over one year. Condition is symptomatic / bothersome to patient. Not to goal.  Ketoconazole 2% cream qhs May consider Terbinafine in the future  ketoconazole (NIZORAL) 2 % cream - Bilateral feet Apply 1 Application topically at bedtime. Apply to feet  Return in about 1 year (around 07/03/2023) for TBSE.  I, Ashok Cordia, CMA, am acting as scribe for Sarina Ser, MD . Documentation: I have reviewed the above documentation for accuracy and completeness, and I agree with the above.  Sarina Ser, MD

## 2022-07-03 NOTE — Patient Instructions (Addendum)
Cryotherapy Aftercare  Wash gently with soap and water everyday.   Apply Vaseline and Band-Aid daily until healed.     Due to recent changes in healthcare laws, you may see results of your pathology and/or laboratory studies on MyChart before the doctors have had a chance to review them. We understand that in some cases there may be results that are confusing or concerning to you. Please understand that not all results are received at the same time and often the doctors may need to interpret multiple results in order to provide you with the best plan of care or course of treatment. Therefore, we ask that you please give us 2 business days to thoroughly review all your results before contacting the office for clarification. Should we see a critical lab result, you will be contacted sooner.   If You Need Anything After Your Visit  If you have any questions or concerns for your doctor, please call our main line at [phone] and press option 4 to reach your doctor's medical assistant. If no one answers, please leave a voicemail as directed and we will return your call as soon as possible. Messages left after 4 pm will be answered the following business day.   You may also send us a message via MyChart. We typically respond to MyChart messages within 1-2 business days.  For prescription refills, please ask your pharmacy to contact our office. Our fax number is [phone].  If you have an urgent issue when the clinic is closed that cannot wait until the next business day, you can page your doctor at the number below.    Please note that while we do our best to be available for urgent issues outside of office hours, we are not available 24/7.   If you have an urgent issue and are unable to reach us, you may choose to seek medical care at your doctor's office, retail clinic, urgent care center, or emergency room.  If you have a medical emergency, please immediately call 911 or go to the  emergency department.  Pager Numbers  - Dr. Kowalski: [phone]  - Dr. Moye: [phone]  - Dr. Stewart: [phone]  In the event of inclement weather, please call our main line at [phone] for an update on the status of any delays or closures.  Dermatology Medication Tips: Please keep the boxes that topical medications come in in order to help keep track of the instructions about where and how to use these. Pharmacies typically print the medication instructions only on the boxes and not directly on the medication tubes.   If your medication is too expensive, please contact our office at [phone] option 4 or send us a message through MyChart.   We are unable to tell what your co-pay for medications will be in advance as this is different depending on your insurance coverage. However, we may be able to find a substitute medication at lower cost or fill out paperwork to get insurance to cover a needed medication.   If a prior authorization is required to get your medication covered by your insurance company, please allow us 1-2 business days to complete this process.  Drug prices often vary depending on where the prescription is filled and some pharmacies may offer cheaper prices.  The website www.goodrx.com contains coupons for medications through different pharmacies. The prices here do not account for what the cost may be with help from insurance (it may be cheaper with your insurance), but the website can   give you the price if you did not use any insurance.  - You can print the associated coupon and take it with your prescription to the pharmacy.  - You may also stop by our office during regular business hours and pick up a GoodRx coupon card.  - If you need your prescription sent electronically to a different pharmacy, notify our office through [place] MyChart or by phone at [phone] option 4.     Si Usted Necesita Algo Despus de Su Visita  Tambin puede  enviarnos un mensaje a travs de MyChart. Por lo general respondemos a los mensajes de MyChart en el transcurso de 1 a 2 das hbiles.  Para renovar recetas, por favor pida a su farmacia que se ponga en contacto con nuestra oficina. Nuestro nmero de fax es el [phone].  Si tiene un asunto urgente cuando la clnica est cerrada y que no puede esperar hasta el siguiente da hbil, puede llamar/localizar a su doctor(a) al nmero que aparece a continuacin.   Por favor, tenga en cuenta que aunque hacemos todo lo posible para estar disponibles para asuntos urgentes fuera del horario de oficina, no estamos disponibles las 24 horas del da, los 7 das de la semana.   Si tiene un problema urgente y no puede comunicarse con nosotros, puede optar por buscar atencin mdica  en el consultorio de su doctor(a), en una clnica privada, en un centro de atencin urgente o en una sala de emergencias.  Si tiene una emergencia mdica, por favor llame inmediatamente al 911 o vaya a la sala de emergencias.  Nmeros de bper  - Dr. Kowalski: [phone]  - Dra. Moye: [phone]  - Dra. Stewart: [phone]  En caso de inclemencias del tiempo, por favor llame a nuestra lnea principal al [phone] para una actualizacin sobre el estado de cualquier retraso o cierre.  Consejos para la medicacin en dermatologa: Por favor, guarde las cajas en las que vienen los medicamentos de uso tpico para ayudarle a seguir las instrucciones sobre dnde y cmo usarlos. Las farmacias generalmente imprimen las instrucciones del medicamento slo en las cajas y no directamente en los tubos del medicamento.   Si su medicamento es muy caro, por favor, pngase en contacto con nuestra oficina llamando al [phone] y presione la opcin 4 o envenos un mensaje a travs de MyChart.   No podemos decirle cul ser su copago por los medicamentos por adelantado ya que esto es diferente dependiendo de la cobertura de su seguro.  Sin embargo, es posible que podamos encontrar un medicamento sustituto a menor costo o llenar un formulario para que el seguro cubra el medicamento que se considera necesario.   Si se requiere una autorizacin previa para que su compaa de seguros cubra su medicamento, por favor permtanos de 1 a 2 das hbiles para completar este proceso.  Los precios de los medicamentos varan con frecuencia dependiendo del lugar de dnde se surte la receta y alguna farmacias pueden ofrecer precios ms baratos.  El sitio web www.goodrx.com tiene cupones para medicamentos de diferentes farmacias. Los precios aqu no tienen en cuenta lo que podra costar con la ayuda del seguro (puede ser ms barato con su seguro), pero el sitio web puede darle el precio si no utiliz ningn seguro.  - Puede imprimir el cupn correspondiente y llevarlo con su receta a la farmacia.  - Tambin puede pasar por nuestra oficina durante el horario de atencin regular y recoger una tarjeta de cupones de GoodRx.  -   Si necesita que su receta se enve electrnicamente a una farmacia diferente, informe a nuestra oficina a travs de MyChart de [place] o por telfono llamando al [phone] y presione la opcin 4.  

## 2022-07-26 ENCOUNTER — Other Ambulatory Visit: Payer: Self-pay | Admitting: Family Medicine

## 2022-07-26 DIAGNOSIS — E119 Type 2 diabetes mellitus without complications: Secondary | ICD-10-CM

## 2022-08-04 ENCOUNTER — Other Ambulatory Visit: Payer: Self-pay | Admitting: Family Medicine

## 2022-08-04 DIAGNOSIS — E1169 Type 2 diabetes mellitus with other specified complication: Secondary | ICD-10-CM

## 2022-08-07 NOTE — Telephone Encounter (Signed)
Unable to refill per protocol, Rx request is too soon. Last refill 09/27/21 for 90 and 3 refills.  Requested Prescriptions  Pending Prescriptions Disp Refills   valsartan (DIOVAN) 40 MG tablet [Pharmacy Med Name: VALSARTAN TAB 40MG ] 90 tablet 3    Sig: TAKE 1 TABLET DAILY     Cardiovascular:  Angiotensin Receptor Blockers Failed - 08/04/2022  5:41 PM      Failed - Cr in normal range and within 180 days    Creat  Date Value Ref Range Status  03/28/2022 0.57 (L) 0.60 - 0.95 mg/dL Final   Creatinine, Urine  Date Value Ref Range Status  09/27/2021 37 20 - 275 mg/dL Final         Failed - Last BP in normal range    BP Readings from Last 1 Encounters:  07/03/22 (!) 179/88         Passed - K in normal range and within 180 days    Potassium  Date Value Ref Range Status  03/28/2022 4.8 3.5 - 5.3 mmol/L Final         Passed - Patient is not pregnant      Passed - Valid encounter within last 6 months    Recent Outpatient Visits           4 months ago Annual physical exam   [place] [facility] Smitty Cords, DO   10 months ago Type 2 diabetes mellitus with other specified complication, without long-term current use of insulin [facility])   [place] [facility] [employer], Netta Neat, DO   1 year ago Annual physical exam   [place] [facility] Smitty Cords, DO   1 year ago Type 2 diabetes mellitus with other specified complication, without long-term current use of insulin [facility])   [place] [facility] [place], Netta Neat, DO   1 year ago Type 2 diabetes mellitus with other specified complication, without long-term current use of insulin [facility])   [place] [facility] [place], Netta Neat, DO       Future Appointments             In 1 month Althea Charon, Netta Neat, DO [place] [facility], [place]   In 11 months Deirdre Evener, MD [facility]

## 2022-08-09 ENCOUNTER — Other Ambulatory Visit: Payer: Self-pay | Admitting: Pharmacist

## 2022-08-09 ENCOUNTER — Other Ambulatory Visit: Payer: Self-pay | Admitting: Family Medicine

## 2022-08-09 DIAGNOSIS — E1169 Type 2 diabetes mellitus with other specified complication: Secondary | ICD-10-CM

## 2022-08-09 NOTE — Progress Notes (Signed)
   08/09/2022  Patient ID: Barbara Santos, female   DOB: [birth year], 87 y.o.   MRN: [id number]  Coordination of Care Call  Receive a message from [facility] CPhT Noreene Larsson [place] advising that she reached out to patient regarding medication adherence to valsartan. Advises patient stated that she is unable to refill her valsartan prescription as mail order pharmacy stated they are unable to stock valsartan 40 mg. [facility] CPhT lets me know that patient reports having ~40 tablets of her valsartan 40 mg remaining.  Outreach to [employer] today on behalf of patient. Speak with Candise Bowens who advises pharmacy does have valsartan 40 mg in stock, but was unable to fill prescription for patient as no refills remain on Rx.   From review of chart, note last Rx for valsartan 40 mg ordered by PCP on 09/27/2021 for 1 year supply (90 day supply with 3 refills).  Patient's next appointment with PCP scheduled for 10/04/2022. In order for patient to receive refill in time from mail order pharmacy, will ask provider to renew valsartan Rx.  Estelle Grumbles, PharmD, [facility] [phone]

## 2022-08-10 ENCOUNTER — Other Ambulatory Visit: Payer: Self-pay | Admitting: Family Medicine

## 2022-08-10 DIAGNOSIS — E1169 Type 2 diabetes mellitus with other specified complication: Secondary | ICD-10-CM

## 2022-08-10 MED ORDER — VALSARTAN 40 MG PO TABS: 40.0000 mg | ORAL_TABLET | Freq: Every day | ORAL | 3 refills | Status: DC

## 2022-08-10 NOTE — Telephone Encounter (Signed)
Rx was sent to pharmacy on 09/27/21 #90/3 RF.   Requested Prescriptions  Refused Prescriptions Disp Refills   valsartan (DIOVAN) 40 MG tablet [Pharmacy Med Name: VALSARTAN TAB ] 90 tablet 3    Sig: TAKE 1 TABLET DAILY     Cardiovascular:  Angiotensin Receptor Blockers Failed - 08/09/2022  4:37 PM      Failed - Cr in normal range and within 180 days    Creat  Date Value Ref Range Status  03/28/2022 0.57 (L) 0.60 - 0.95 mg/dL Final   Creatinine, Urine  Date Value Ref Range Status  09/27/2021 37 20 - 275 mg/dL Final         Failed - Last BP in normal range    BP Readings from Last 1 Encounters:  07/03/22 (!) 179/88         Passed - K in normal range and within 180 days    Potassium  Date Value Ref Range Status  03/28/2022 4.8 3.5 - 5.3 mmol/L Final         Passed - Patient is not pregnant      Passed - Valid encounter within last 6 months    Recent Outpatient Visits           4 months ago Annual physical exam   [place] [facility] Smitty Cords, DO   10 months ago Type 2 diabetes mellitus with other specified complication, without long-term current use of insulin [facility])   [place] [facility] [place], Netta Neat, DO   1 year ago Annual physical exam   [place] [facility] Smitty Cords, DO   1 year ago Type 2 diabetes mellitus with other specified complication, without long-term current use of insulin [facility])   [place] [facility] [place], Netta Neat, DO   1 year ago Type 2 diabetes mellitus with other specified complication, without long-term current use of insulin [facility])   [place] [facility] [place], Netta Neat, DO       Future Appointments             In 1 month Althea Charon, Netta Neat, DO  [facility]   In 10 months Deirdre Evener, MD [facility]

## 2022-09-14 DIAGNOSIS — H40001 Preglaucoma, unspecified, right eye: Secondary | ICD-10-CM | POA: Diagnosis not present

## 2022-09-14 DIAGNOSIS — H401122 Primary open-angle glaucoma, left eye, moderate stage: Secondary | ICD-10-CM | POA: Diagnosis not present

## 2022-09-14 DIAGNOSIS — E119 Type 2 diabetes mellitus without complications: Secondary | ICD-10-CM | POA: Diagnosis not present

## 2022-09-14 DIAGNOSIS — Z961 Presence of intraocular lens: Secondary | ICD-10-CM | POA: Diagnosis not present

## 2022-09-14 LAB — HM DIABETES EYE EXAM

## 2022-09-15 ENCOUNTER — Other Ambulatory Visit: Payer: Self-pay | Admitting: Family Medicine

## 2022-09-15 DIAGNOSIS — E1169 Type 2 diabetes mellitus with other specified complication: Secondary | ICD-10-CM

## 2022-09-15 NOTE — Telephone Encounter (Signed)
Requested Prescriptions  Pending Prescriptions Disp Refills   metFORMIN (GLUCOPHAGE-XR) 500 MG 24 hr tablet [Pharmacy Med Name: METFORMIN ER TAB 500MG  GP] 180 tablet 0    Sig: TAKE 1 TABLET TWICE A DAY  WITH MEALS     Endocrinology:  Diabetes - Biguanides Failed - 09/15/2022  8:11 AM      Failed - Cr in normal range and within 360 days    Creat  Date Value Ref Range Status  03/28/2022 0.57 (L) 0.60 - 0.95 mg/dL Final   Creatinine, Urine  Date Value Ref Range Status  09/27/2021 37 20 - 275 mg/dL Final         Failed - HBA1C is between 0 and 7.9 and within 180 days    Hgb A1c MFr Bld  Date Value Ref Range Status  03/28/2022 8.0 (H) <5.7 % of total Hgb Final    Comment:    For someone without known diabetes, a hemoglobin A1c value of 6.5% or greater indicates that they may have  diabetes and this should be confirmed with a follow-up  test. . For someone with known diabetes, a value <7% indicates  that their diabetes is well controlled and a value  greater than or equal to 7% indicates suboptimal  control. A1c targets should be individualized based on  duration of diabetes, age, comorbid conditions, and  other considerations. . Currently, no consensus exists regarding use of hemoglobin A1c for diagnosis of diabetes for children. .          Failed - B12 Level in normal range and within 720 days    No results found for: "VITAMINB12"       Passed - eGFR in normal range and within 360 days    GFR, Est African American  Date Value Ref Range Status  10/19/2020 98 > OR = 60 mL/min/1.73m2 Final   GFR, Est Non African American  Date Value Ref Range Status  10/19/2020 84 > OR = 60 mL/min/1.71m2 Final   eGFR  Date Value Ref Range Status  03/28/2022 90 > OR = 60 mL/min/1.66m2 Final         Passed - Valid encounter within last 6 months    Recent Outpatient Visits           5 months ago Annual physical exam   [place] [facility] Smitty Cords,  DO   11 months ago Type 2 diabetes mellitus with other specified complication, without long-term current use of insulin [facility])   [place] [facility] Althea Charon, Netta Neat, DO   1 year ago Annual physical exam   [place] [facility] Smitty Cords, DO   1 year ago Type 2 diabetes mellitus with other specified complication, without long-term current use of insulin [facility])   [place] [facility], Netta Neat, DO   1 year ago Type 2 diabetes mellitus with other specified complication, without long-term current use of insulin [facility])   [place] [facility] [place], Netta Neat, DO       Future Appointments             In 2 weeks Althea Charon, Netta Neat, DO [place] [facility] [place], [place]   In 9 months Deirdre Evener, MD [place] [facility]            Passed - CBC within normal limits and completed in the last 12 months  WBC  Date Value Ref Range Status  03/28/2022 5.7 3.8 - 10.8 Thousand/uL Final   RBC  Date Value Ref Range Status  03/28/2022 4.26 3.80 - 5.10 Million/uL Final   Hemoglobin  Date Value Ref Range Status  03/28/2022 13.2 11.7 - 15.5 g/dL Final  16/01/9603 54.0 11.1 - 15.9 g/dL Final   HCT  Date Value Ref Range Status  03/28/2022 39.9 35.0 - 45.0 % Final   Hematocrit  Date Value Ref Range Status  01/12/2015 39.8 34.0 - 46.6 % Final   MCHC  Date Value Ref Range Status  03/28/2022 33.1 32.0 - 36.0 g/dL Final   [facility]  Date Value Ref Range Status  03/28/2022 31.0 27.0 - 33.0 pg Final   MCV  Date Value Ref Range Status  03/28/2022 93.7 80.0 - 100.0 fL Final  01/12/2015 92 79 - 97 fL Final   No results found for: "PLTCOUNTKUC", "LABPLAT", "POCPLA" RDW  Date Value Ref Range Status  03/28/2022 11.7 11.0 - 15.0 % Final  01/12/2015 13.4 12.3 - 15.4 % Final

## 2022-10-04 ENCOUNTER — Ambulatory Visit (INDEPENDENT_AMBULATORY_CARE_PROVIDER_SITE_OTHER): Payer: Medicare HMO | Admitting: Family Medicine

## 2022-10-04 ENCOUNTER — Encounter: Payer: Self-pay | Admitting: Family Medicine

## 2022-10-04 ENCOUNTER — Other Ambulatory Visit: Payer: Self-pay | Admitting: Family Medicine

## 2022-10-04 VITALS — BP 138/61 | HR 62 | Resp 16 | Ht 61.0 in | Wt 88.2 lb

## 2022-10-04 DIAGNOSIS — I10 Essential (primary) hypertension: Secondary | ICD-10-CM | POA: Diagnosis not present

## 2022-10-04 DIAGNOSIS — E7800 Pure hypercholesterolemia, unspecified: Secondary | ICD-10-CM | POA: Diagnosis not present

## 2022-10-04 DIAGNOSIS — E039 Hypothyroidism, unspecified: Secondary | ICD-10-CM

## 2022-10-04 DIAGNOSIS — Z0000 Encounter for general adult medical examination without abnormal findings: Secondary | ICD-10-CM

## 2022-10-04 DIAGNOSIS — E1169 Type 2 diabetes mellitus with other specified complication: Secondary | ICD-10-CM | POA: Diagnosis not present

## 2022-10-04 DIAGNOSIS — E440 Moderate protein-calorie malnutrition: Secondary | ICD-10-CM

## 2022-10-04 LAB — POCT GLYCOSYLATED HEMOGLOBIN (HGB A1C): Hemoglobin A1C: 7.7 % — AB (ref 4.0–5.6)

## 2022-10-04 MED ORDER — ROSUVASTATIN CALCIUM 5 MG PO TABS: 5.0000 mg | ORAL_TABLET | Freq: Every day | ORAL | 3 refills | Status: DC

## 2022-10-04 MED ORDER — METFORMIN HCL ER 500 MG PO TB24: 500.0000 mg | ORAL_TABLET | Freq: Two times a day (BID) | ORAL | 3 refills | Status: DC

## 2022-10-04 NOTE — Assessment & Plan Note (Signed)
Well-controlled HYPERTENSION, BP repeat on manual cuff improved - Home BP readings reviewed, normal, has cuff 130-140 avg No known complications    Plan:  Discussed option to trial 2 weeks inc BP monitoring at same dose Valsartan 40mg  daily and then 2 weeks of dose inc valsartan 80mg  daily, and see how her readings compare and notify us. May dose increase   2. Encourage improved lifestyle - low sodium diet, regular exercise 3. Continue monitor BP outside office, bring readings to next visit, if persistently >140/90 or new symptoms notify office sooner

## 2022-10-04 NOTE — Assessment & Plan Note (Signed)
Stable, controlled hypothyroidism Check labs next visit 03/2023 Continue Levothyroxine daily

## 2022-10-04 NOTE — Assessment & Plan Note (Signed)
A1c down to 7.7 improved Complicated by hypothyroidism and hyperlipidemia  Plan:  Continue Metformin XR 500 BID Encourage improved lifestyle -and acceptable to allow A1c / CBGs to increase to 7+ range Check CBG, bring log to next visit for review Continue ARB Continue Statin

## 2022-10-04 NOTE — Patient Instructions (Addendum)
Thank you for coming to the office today.  Recent Labs    03/28/22 0814 10/04/22 0849  HGBA1C 8.0* 7.7*    Try Valsartan dosing change for 2 weeks  Measure BP more often with current dose 1 pill 40mg  per day for 2 weeks Then double dose Valsartan 40mg  x 2 = 80mg , for 2 weeks and measure BP  Call back with readings and if you prefer to dose increase or keep same dose  Goal < 140 consistently.  Urine protein test today.  DUE for FASTING BLOOD WORK (no food or drink after midnight before the lab appointment, only water or coffee without cream/sugar on the morning of)  SCHEDULE "Lab Only" visit in the morning at the clinic for lab draw in 6 MONTHS   - Make sure Lab Only appointment is at about 1 week before your next appointment, so that results will be available  For Lab Results, once available within 2-3 days of blood draw, you can can log in to MyChart online to view your results and a brief explanation. Also, we can discuss results at next follow-up visit.   Please schedule a Follow-up Appointment to: Return in about 6 months (around 04/05/2023) for 6 month fasting lab only then 1 week later Annual Physical.  If you have any other questions or concerns, please feel free to call the office or send a message through MyChart. You may also schedule an earlier appointment if necessary.  Additionally, you may be receiving a survey about your experience at our office within a few days to 1 week by e-mail or mail. We value your feedback.  Saralyn Pilar, DO [facility], [place]

## 2022-10-04 NOTE — Assessment & Plan Note (Signed)
Weight fluctuating, down 2 lbs in 6 months but appetite remains good

## 2022-10-04 NOTE — Progress Notes (Signed)
Subjective:    Patient ID: Barbara Santos, female    DOB: [birth year], 87 y.o.   MRN: [id number]  Barbara Santos is a 87 y.o. female presenting on 10/04/2022 for Hypertension (6 month fu ) and Diabetes (6 month )   HPI   CHRONIC DM, Type 2: BMI >17 moderate protein calorie malnutrition - Today reports doing well on medicine overall CBGs: Avg 130-180,  OneTouch Verio  She keeps diet consistent. She said usually her blood sugar does not spike in morning. She checked CBG 2 hour. She will eat portion of toast, sugar free jelly, and portion of muffin  Meds: Metformin XR 500mg  BID Currently on ARB Lifestyle: - Diet - often eating baked chicken and higher protein supplement drinks. 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   CHRONIC HTN: BP remains stable at home. Home Reading 138/61  Home BP readings 130-142 / 55-70 Current Meds - Valsartan 40mg  daily   Reports good compliance, took meds today. Tolerating well, w/o complaints. Lifestyle: - Diet: Limited sodium diet now, avoid salt added. Limited meats Admits mild HA in AM self limited Denies CP, dyspnea, edema, dizziness / lightheadedness   HYPERLIPIDEMIA: - Reports no concerns. Last lipid panel 03/2022, controlled  - Currently taking Rosuvastatin 5mg , tolerating well without side effects or myalgias   Hypothyroidism Controlled on labs, Continues Levothyroxine dose daily.       10/04/2022    8:45 AM 04/04/2022    8:38 AM 01/20/2022    9:32 AM  Depression screen PHQ 2/9  Decreased Interest 0 0 0  Down, Depressed, Hopeless 0 0 1  PHQ - 2 Score 0 0 1  Altered sleeping 0 0 0  Tired, decreased energy 0 0 0  Change in appetite 0 0 0  Feeling bad or failure about yourself  0 0 0   Trouble concentrating 0 0 0  Moving slowly or fidgety/restless 0 0 0  Suicidal thoughts 0 0 0  PHQ-9 Score 0 0 1  Difficult doing work/chores Not difficult at all Not difficult at all Not difficult at all    Social History   Tobacco Use   Smoking status: Never   Smokeless tobacco: Never  Vaping Use   Vaping Use: Never used  Substance Use Topics   Alcohol use: No    Alcohol/week: 0.0 standard drinks of alcohol   Drug use: No    Review of Systems Per HPI unless specifically indicated above     Objective:    BP 138/61 Comment: home reading  Pulse 62   Resp 16   Ht 5\' 1"  (1.549 m)   Wt 88 lb 3.2 oz (40 kg)   SpO2 99%   BMI 16.67 kg/m   Wt Readings from Last 3 Encounters:  10/04/22 88 lb 3.2 oz (40 kg)  04/04/22 91 lb 9.6 oz (41.5 kg)  01/20/22 90 lb 6.4 oz (41 kg)    Physical Exam Vitals and nursing note reviewed.  Constitutional:      General: She is not in acute distress.    Appearance: She is well-developed. She is not diaphoretic.     Comments: Well-appearing, comfortable, cooperative  HENT:     Head: Normocephalic and atraumatic.  Eyes:     General:  Right eye: No discharge.        Left eye: No discharge.     Conjunctiva/sclera: Conjunctivae normal.  Neck:     Thyroid: No thyromegaly.  Cardiovascular:     Rate and Rhythm: Normal rate and regular rhythm.     Heart sounds: Normal heart sounds. No murmur heard. Pulmonary:     Effort: Pulmonary effort is normal. No respiratory distress.     Breath sounds: Normal breath sounds. No wheezing or rales.  Musculoskeletal:        General: Normal range of motion.     Cervical back: Normal range of motion and neck supple.  Lymphadenopathy:     Cervical: No cervical adenopathy.  Skin:    General: Skin is warm and dry.     Findings: No erythema or rash.  Neurological:     Mental Status: She is alert and oriented to person, place, and time.  Psychiatric:        Behavior: Behavior normal.     Comments:  Well groomed, good eye contact, normal speech and thoughts     Recent Labs    03/28/22 0814 10/04/22 0849  HGBA1C 8.0* 7.7*     Results for orders placed or performed in visit on 10/04/22  POCT HgB A1C  Result Value Ref Range   Hemoglobin A1C 7.7 (A) 4.0 - 5.6 %   HbA1c POC (<> result, manual entry)     HbA1c, POC (prediabetic range)     HbA1c, POC (controlled diabetic range)        Assessment & Plan:   Problem List Items Addressed This Visit     Elevated LDL cholesterol level   Relevant Medications   rosuvastatin (CRESTOR) 5 MG tablet   Essential hypertension    Well-controlled HYPERTENSION, BP repeat on manual cuff improved - Home BP readings reviewed, normal, has cuff 130-140 avg No known complications    Plan:  Discussed option to trial 2 weeks inc BP monitoring at same dose Valsartan 40mg  daily and then 2 weeks of dose inc valsartan 80mg  daily, and see how her readings compare and notify us. May dose increase   2. Encourage improved lifestyle - low sodium diet, regular exercise 3. Continue monitor BP outside office, bring readings to next visit, if persistently >140/90 or new symptoms notify office sooner      Relevant Medications   rosuvastatin (CRESTOR) 5 MG tablet   Hypothyroidism    Stable, controlled hypothyroidism Check labs next visit 03/2023 Continue Levothyroxine daily      Protein calorie malnutrition (HCC)    Weight fluctuating, down 2 lbs in 6 months but appetite remains good      Type 2 diabetes mellitus with other specified complication (HCC) - Primary    A1c down to 7.7 improved Complicated by hypothyroidism and hyperlipidemia  Plan:  Continue Metformin XR 500 BID Encourage improved lifestyle -and acceptable to allow A1c / CBGs to increase to 7+ range Check CBG, bring log to next visit for review Continue ARB Continue Statin      Relevant Medications   metFORMIN (GLUCOPHAGE-XR) 500 MG 24 hr tablet   rosuvastatin (CRESTOR) 5  MG tablet   Other Relevant Orders   POCT HgB A1C (Completed)   Urine Microalbumin w/creat. ratio    Meds ordered this encounter  Medications   metFORMIN (GLUCOPHAGE-XR) 500 MG 24 hr tablet    Sig: Take 1 tablet (500 mg total) by mouth 2 (two) times daily with a meal.  Dispense:  180 tablet    Refill:  3    Add future refills   rosuvastatin (CRESTOR) 5 MG tablet    Sig: Take 1 tablet (5 mg total) by mouth at bedtime.    Dispense:  90 tablet    Refill:  3    Add future refills      Follow up plan: Return in about 6 months (around 04/05/2023) for 6 month fasting lab only then 1 week later Annual Physical.  Future labs ordered for 03/2023 TSH Free T4   Saralyn Pilar, DO [facility] 10/04/2022, 8:51 AM

## 2022-10-05 LAB — MICROALBUMIN / CREATININE URINE RATIO
Creatinine, Urine: 40 mg/dL (ref 20–275)
Microalb Creat Ratio: 33 mg/g creat — ABNORMAL HIGH (ref <30)
Microalb, Ur: 1.3 mg/dL

## 2022-11-09 ENCOUNTER — Other Ambulatory Visit: Payer: Self-pay | Admitting: Family Medicine

## 2022-11-09 DIAGNOSIS — Z1231 Encounter for screening mammogram for malignant neoplasm of breast: Secondary | ICD-10-CM

## 2022-11-16 ENCOUNTER — Ambulatory Visit: Payer: Self-pay

## 2022-11-16 DIAGNOSIS — E1169 Type 2 diabetes mellitus with other specified complication: Secondary | ICD-10-CM

## 2022-11-16 MED ORDER — VALSARTAN 40 MG PO TABS: 60.0000 mg | ORAL_TABLET | Freq: Every day | ORAL | 3 refills | Status: DC

## 2022-11-16 NOTE — Telephone Encounter (Signed)
Patient informed of medication direction.

## 2022-11-16 NOTE — Telephone Encounter (Signed)
Please let her know  Okay sure, she can try 60mg , which is 1.5 of the 40mg  tabs. I sent new order to [facility] for 135 tabs which is a 90 day supply with refills added.  Barbara Pilar, DO [facility] 11/16/2022, 1:19 PM

## 2022-11-16 NOTE — Telephone Encounter (Signed)
Pt states that her PCP advised her to double up on her medication and go from 40mg  to 80mg .  Pt states that she has been taking 60mg  and seem to be doing better and is not having any issues. Pt want to know if it is ok for her to only take the 60mg . Pt also states that she does not have a lot of the medication left since she had to increase it. Please call pt to discuss.     [facility] - [place], [place] - [facility] AT Portal to [facility] Phone: [phone] Fax: [phone]            Chief Complaint: Pt. States she was told ti increase her Valsartan to 80 mg. Has been taking 60 mg and BP "has been good - 127/62, 134/56." Will need refill soon. Symptoms: n/a Frequency: n/a Pertinent Negatives: Patient denies any problems Disposition: [] ED /[] Urgent Care (no appt availability in office) / [] Appointment(In office/virtual)/ []  [place] [facility]/ [] Home Care/ [] Refused Recommended Disposition /[] [place] [facility]/ [x]  Follow-up with PCP Additional Notes: Please advise pt.  Reason for Disposition  [1] Caller has URGENT medicine question about med that PCP or specialist prescribed AND [2] triager unable to answer question  Answer Assessment - Initial Assessment Questions 1. NAME of MEDICINE: "What medicine(s) are you calling about?"     Valsartan 2. QUESTION: "What is your question?" (e.g., double dose of medicine, side effect)     Taking 60 mg instead 80 mg 3. PRESCRIBER: "Who prescribed the medicine?" Reason: if prescribed by specialist, call should be referred to that group.     PCP 4. SYMPTOMS: "Do you have any symptoms?" If Yes, ask: "What symptoms are you having?"  "How bad are the symptoms (e.g., mild, moderate, severe)     No 5. PREGNANCY:  "Is there any chance that you are pregnant?" "When was your last menstrual period?"     No  Protocols used: Medication Question Call-A-AH

## 2022-11-17 ENCOUNTER — Ambulatory Visit
Admission: RE | Admit: 2022-11-17 | Discharge: 2022-11-17 | Disposition: A | Discharge status: Home / Self Care | Payer: Medicare HMO | Source: Ambulatory Visit | Attending: Family Medicine | Admitting: Family Medicine

## 2022-11-17 DIAGNOSIS — Z1231 Encounter for screening mammogram for malignant neoplasm of breast: Secondary | ICD-10-CM | POA: Diagnosis not present

## 2022-11-22 IMAGING — MG MM DIGITAL DIAGNOSTIC UNILAT*R* W/ TOMO W/ CAD
6 series · 6 of 18 positions shown · non-contrast
Comparison: Previous exam(s).

CLINICAL DATA: Recall from screening mammography, possible one-view
asymmetry in the UPPER breast at POSTERIOR depth visible only on the
MLO view.

EXAM:
DIGITAL DIAGNOSTIC UNILATERAL RIGHT MAMMOGRAM WITH TOMOSYNTHESIS AND
CAD; ULTRASOUND RIGHT BREAST LIMITED
TECHNIQUE: Right digital diagnostic mammography and breast tomosynthesis was
performed. The images were evaluated with computer-aided detection.;
Targeted ultrasound examination of the right breast was performed

[R MLO synth-2D (1 of 2)]
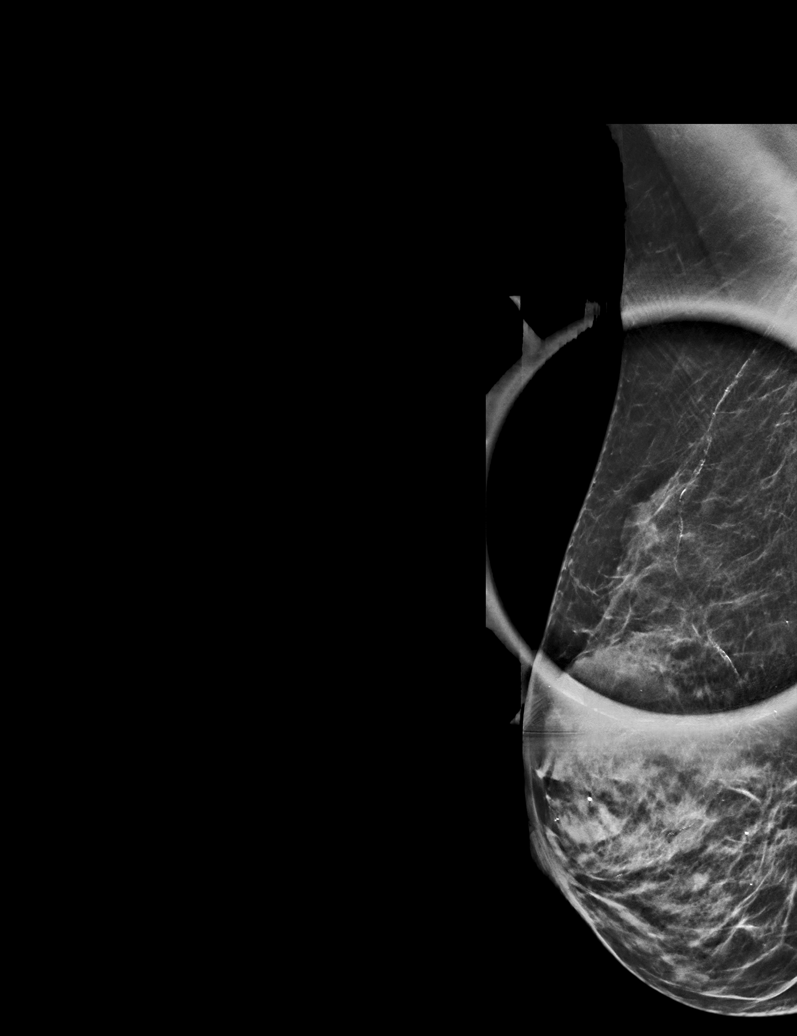

[R MLO synth-2D (2 of 2)]
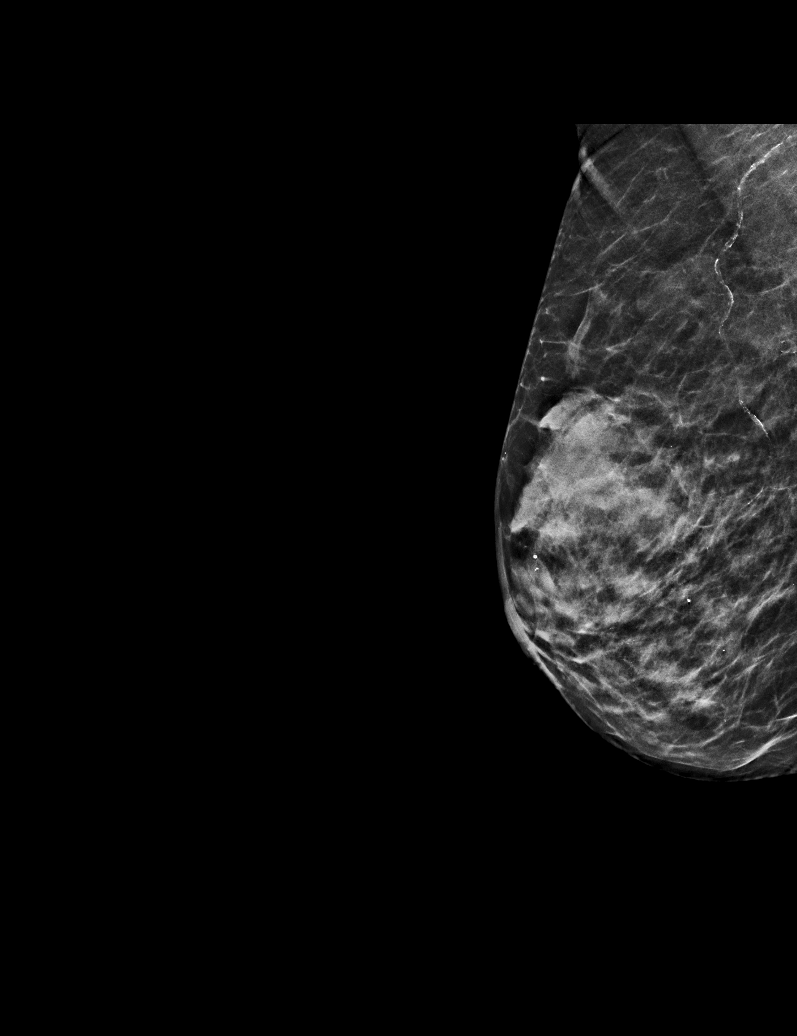

[R ML synth-2D]
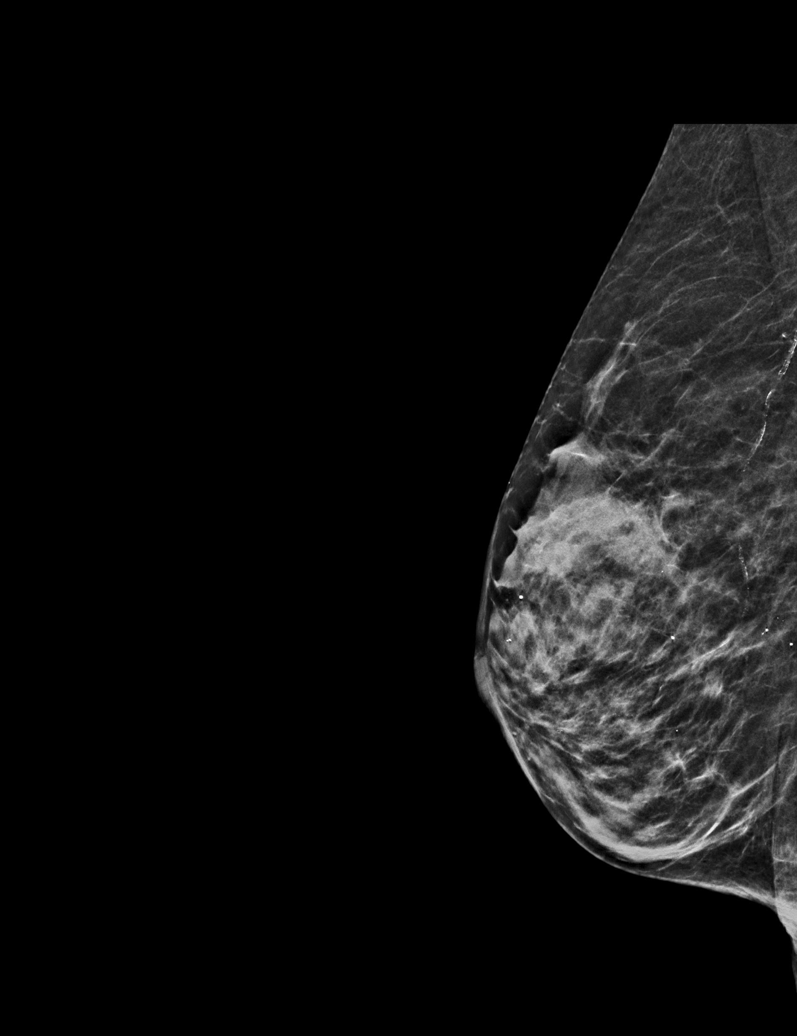

[R MLO tomo (1 of 2) · tomo slice 23/44.0]
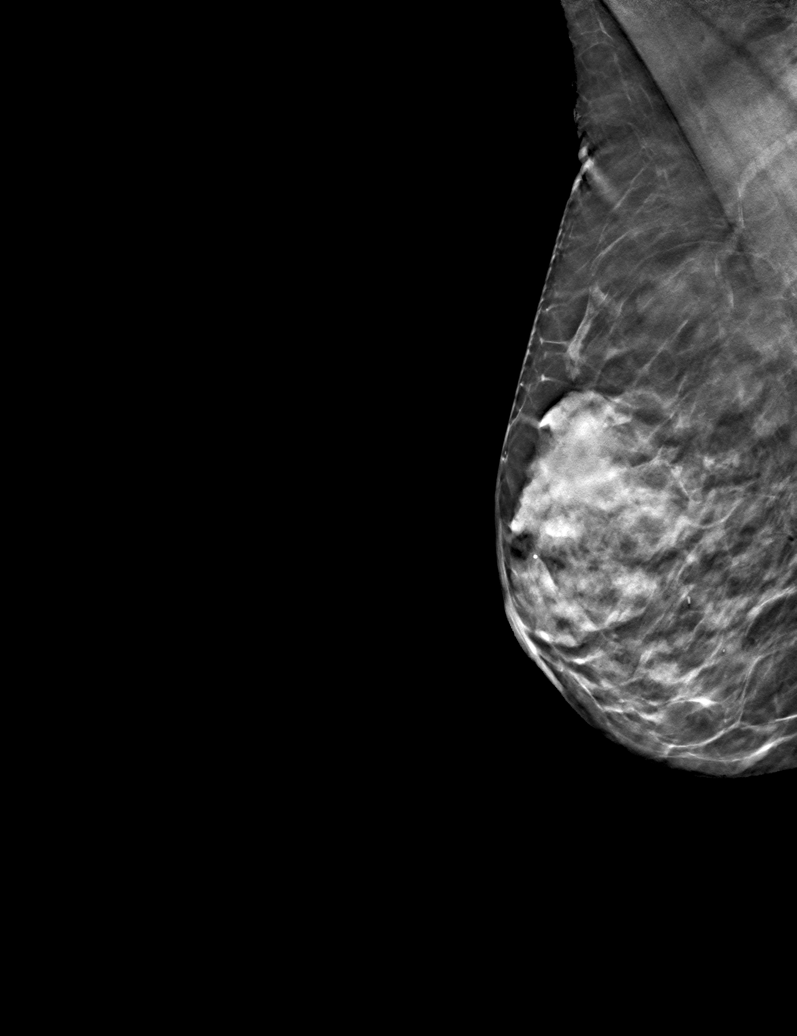

[R MLO tomo (2 of 2) · tomo slice 21/40.0]
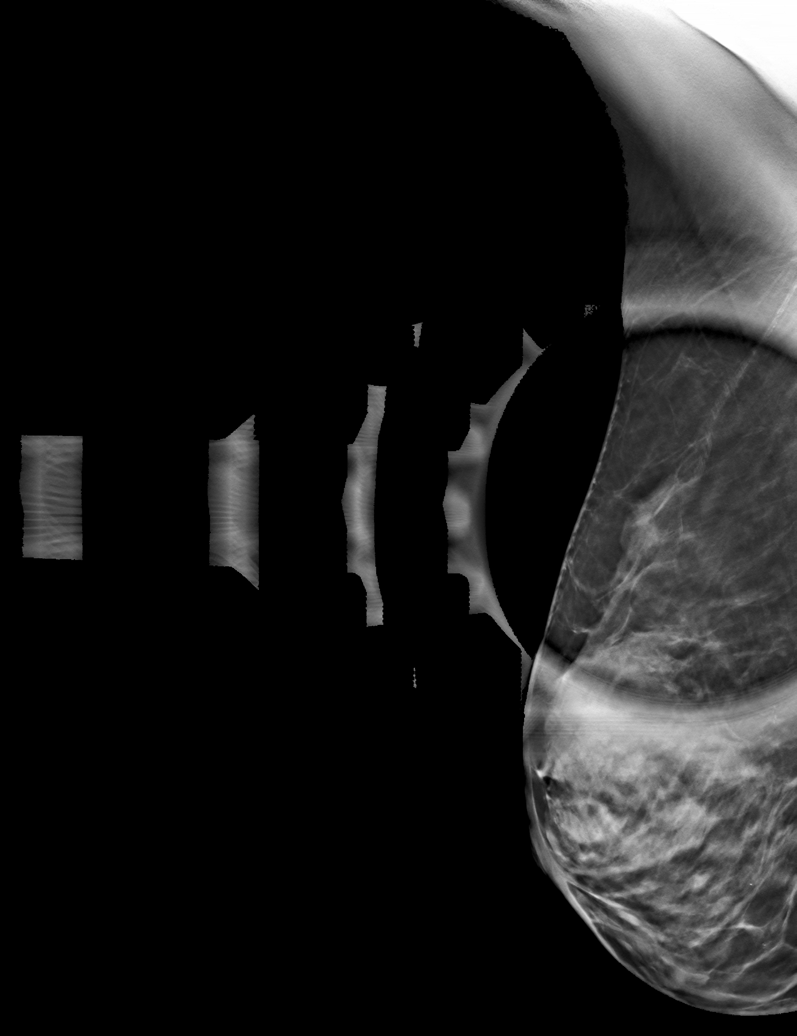

[R ML tomo · tomo slice 21/41.0]
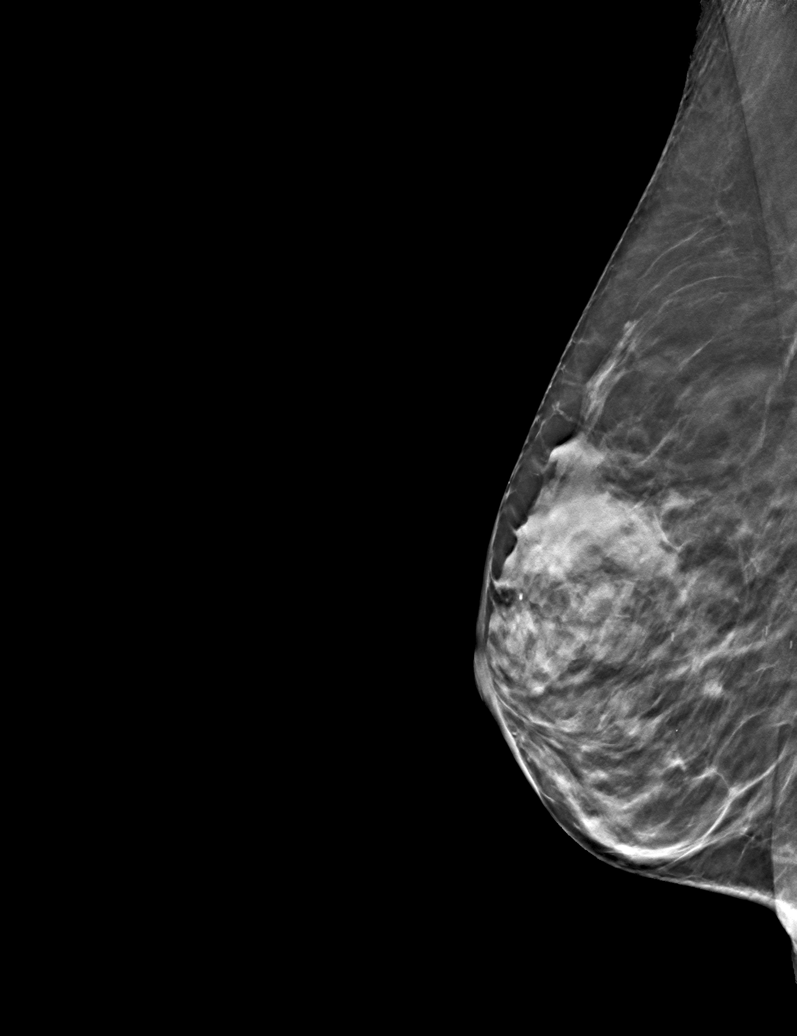

[6 of 18 positions shown; findings below may reference images not displayed]

ACR Breast Density Category c: The breast tissue is heterogeneously
dense, which may obscure small masses.
FINDINGS: Spot-compression MLO view of the area of concern and a full field
mediolateral view were obtained.

The asymmetry in the upper breast at posterior depth persists but
partially disperses on the spot compression view, likely indicating
an island of fibroglandular tissue. There is no convincing
underlying mass and there is no underlying architectural distortion.

On the full field mediolateral view, the asymmetry has the
appearance of fibroglandular tissue and is more inferiorly located
relative to its location on the screening MLO view, indicating its
location in the outer breast, therefore UPPER OUTER QUADRANT.

Targeted ultrasound is performed, demonstrating normal
fibroglandular tissue throughout the UPPER OUTER QUADRANT. At the
10:30 o'clock location approximately 5 cm from nipple at posterior
depth (adjacent to the pectoralis muscle) is an island of
fibroglandular tissue with an oval morphology which likely
corresponds to the screening mammographic finding. No cyst, solid
mass or abnormal acoustic shadowing is identified.
IMPRESSION: No mammographic or sonographic evidence of malignancy involving the
RIGHT breast.

RECOMMENDATION:
Screening mammogram in one year as long as the patient has a life
expectancy of 10+ years.(Code:[id number]-[id number])

I have discussed the findings and recommendations with the patient.
If applicable, a reminder letter will be sent to the patient
regarding the next appointment.

BI-RADS CATEGORY  1: Negative.

## 2022-11-27 DIAGNOSIS — I129 Hypertensive chronic kidney disease with stage 1 through stage 4 chronic kidney disease, or unspecified chronic kidney disease: Secondary | ICD-10-CM | POA: Diagnosis not present

## 2022-11-27 DIAGNOSIS — Z7984 Long term (current) use of oral hypoglycemic drugs: Secondary | ICD-10-CM | POA: Diagnosis not present

## 2022-11-27 DIAGNOSIS — N181 Chronic kidney disease, stage 1: Secondary | ICD-10-CM | POA: Diagnosis not present

## 2022-11-27 DIAGNOSIS — E039 Hypothyroidism, unspecified: Secondary | ICD-10-CM | POA: Diagnosis not present

## 2022-11-27 DIAGNOSIS — Z008 Encounter for other general examination: Secondary | ICD-10-CM | POA: Diagnosis not present

## 2022-11-27 DIAGNOSIS — Z809 Family history of malignant neoplasm, unspecified: Secondary | ICD-10-CM | POA: Diagnosis not present

## 2022-11-27 DIAGNOSIS — E1122 Type 2 diabetes mellitus with diabetic chronic kidney disease: Secondary | ICD-10-CM | POA: Diagnosis not present

## 2022-11-27 DIAGNOSIS — Z9181 History of falling: Secondary | ICD-10-CM | POA: Diagnosis not present

## 2022-11-27 DIAGNOSIS — H409 Unspecified glaucoma: Secondary | ICD-10-CM | POA: Diagnosis not present

## 2022-11-27 DIAGNOSIS — E785 Hyperlipidemia, unspecified: Secondary | ICD-10-CM | POA: Diagnosis not present

## 2022-11-27 DIAGNOSIS — Z823 Family history of stroke: Secondary | ICD-10-CM | POA: Diagnosis not present

## 2022-11-27 DIAGNOSIS — Z8249 Family history of ischemic heart disease and other diseases of the circulatory system: Secondary | ICD-10-CM | POA: Diagnosis not present

## 2022-11-27 DIAGNOSIS — E1151 Type 2 diabetes mellitus with diabetic peripheral angiopathy without gangrene: Secondary | ICD-10-CM | POA: Diagnosis not present

## 2023-01-01 ENCOUNTER — Other Ambulatory Visit: Payer: Self-pay | Admitting: Family Medicine

## 2023-01-01 DIAGNOSIS — E119 Type 2 diabetes mellitus without complications: Secondary | ICD-10-CM

## 2023-01-02 NOTE — Telephone Encounter (Signed)
Requested Prescriptions  Pending Prescriptions Disp Refills   glucose blood (ONETOUCH VERIO) test strip [Pharmacy Med Name: ONE TOUCH VERIO TEST STRIP] 100 strip 0    Sig: USE TO CHECK BLOOD SUGAR ONCE A DAY. DX E11.9     Endocrinology: Diabetes - Testing Supplies Passed - 01/01/2023 10:35 AM      Passed - Valid encounter within last 12 months    Recent Outpatient Visits           3 months ago Type 2 diabetes mellitus with other specified complication, without long-term current use of insulin [facility])   [place] [facility] [place], Netta Neat, DO   9 months ago Annual physical exam   [place] [facility] Smitty Cords, DO   1 year ago Type 2 diabetes mellitus with other specified complication, without long-term current use of insulin [facility])   [place] [facility] Smitty Cords, DO   1 year ago Annual physical exam   [place] [facility] Smitty Cords, DO   2 years ago Type 2 diabetes mellitus with other specified complication, without long-term current use of insulin [facility])   [place] [facility], Netta Neat, DO       Future Appointments             In 3 months Althea Charon, Netta Neat, DO [place] [facility], [place]   In 6 months Deirdre Evener, MD [facility]

## 2023-01-26 ENCOUNTER — Ambulatory Visit (INDEPENDENT_AMBULATORY_CARE_PROVIDER_SITE_OTHER): Payer: Medicare HMO

## 2023-01-26 VITALS — BP 120/58 | Ht 61.0 in | Wt 86.6 lb

## 2023-01-26 DIAGNOSIS — Z23 Encounter for immunization: Secondary | ICD-10-CM

## 2023-01-26 DIAGNOSIS — Z0000 Encounter for general adult medical examination without abnormal findings: Secondary | ICD-10-CM

## 2023-01-26 NOTE — Patient Instructions (Addendum)
Barbara Santos , Thank you for taking time to come for your Medicare Wellness Visit. I appreciate your ongoing commitment to your health goals. Please review the following plan we discussed and let me know if I can assist you in the future.   Referrals/Orders/Follow-Ups/Clinician Recommendations: none  This is a list of the screening recommended for you and due dates:  Health Maintenance  Topic Date Due   Flu Shot  11/23/2022   COVID-19 Vaccine (6 - 2023-24 season) 12/24/2022   Yearly kidney function blood test for diabetes  03/29/2023   Complete foot exam   04/05/2023   Hemoglobin A1C  04/05/2023   Eye exam for diabetics  09/14/2023   Yearly kidney health urinalysis for diabetes  10/04/2023   DTaP/Tdap/Td vaccine (2 - Td or Tdap) 12/04/2023   Medicare Annual Wellness Visit  01/26/2024   Pneumonia Vaccine  Completed   DEXA scan (bone density measurement)  Completed   Zoster (Shingles) Vaccine  Completed   HPV Vaccine  Aged Out    Advanced directives: (ACP Link)Information on Advanced Care Planning can be found at [facility] Advance Health Care Directives Advance Health Care Directives ([email or web address]   Next Medicare Annual Wellness Visit scheduled for next year: Yes   01/31/24 @ 2:20 pm in person

## 2023-01-26 NOTE — Progress Notes (Signed)
Subjective:   Barbara Santos is a 87 y.o. female who presents for Medicare Annual (Subsequent) preventive examination.  Visit Complete: In person   Cardiac Risk Factors include: advanced age (>90men, >21 women);diabetes mellitus;hypertension     Objective:    Today's Vitals   01/26/23 0906  BP: (!) 120/58  Weight: 86 lb 9.6 oz (39.3 kg)  Height: 5\' 1"  (1.549 m)   Body mass index is 16.36 kg/m.     01/26/2023    9:14 AM 01/20/2022    9:34 AM 01/11/2021    2:40 PM 01/28/2020    6:25 AM 01/06/2020    1:23 PM 12/31/2019    6:44 AM 11/19/2018    9:28 AM  Advanced Directives  Does Patient Have a Medical Advance Directive? No No Yes Yes Yes Yes Yes  Type of Surveyor, minerals;Living will Healthcare Power of [place];Living will Living will;Healthcare Power of [employer] Power of [place];Living will Living will;Healthcare Power of Attorney  Does patient want to make changes to medical advance directive?    No - Patient declined  No - Patient declined   Copy of Healthcare Power of Attorney in Chart?   No - copy requested Yes - validated most recent copy scanned in chart (See row information) No - copy requested Yes - validated most recent copy scanned in chart (See row information) No - copy requested  Would patient like information on creating a medical advance directive? No - Patient declined No - Patient declined         Current Medications (verified) Outpatient Encounter Medications as of 01/26/2023  Medication Sig   Blood Glucose Monitoring Suppl (ONETOUCH VERIO IQ SYSTEM) w/Device KIT Use to check blood sugar up to 2 x daily   Cholecalciferol (VITAMIN D) 2000 UNITS tablet Take 2,000 Units by mouth daily.   diphenhydrAMINE (BENADRYL) 25 MG tablet Take 25 mg by mouth every 6 (six) hours as needed.   dorzolamide-timolol (COSOPT) 22.3-6.8 MG/ML ophthalmic solution Place 1 drop into both eyes 2 (two) times daily.    glucose blood (ONETOUCH  VERIO) test strip USE TO CHECK BLOOD SUGAR ONCE A DAY. DX E11.9   ketoconazole (NIZORAL) 2 % cream Apply 1 Application topically at bedtime. Apply to feet   lactose free nutrition (BOOST) LIQD Take 237 mLs by mouth 3 (three) times daily between meals.   levothyroxine (SYNTHROID) 50 MCG tablet TAKE 1 TABLET DAILY BEFORE BREAKFAST   metFORMIN (GLUCOPHAGE-XR) 500 MG 24 hr tablet Take 1 tablet (500 mg total) by mouth 2 (two) times daily with a meal.   Multiple Vitamin (MULTIVITAMIN) tablet Take 1 tablet by mouth daily.   Nutritional Supplements (JUICE PLUS FIBRE PO) Take by mouth.   ONETOUCH DELICA LANCETS FINE MISC 1 each by Other route 3 (three) times daily.   rosuvastatin (CRESTOR) 5 MG tablet Take 1 tablet (5 mg total) by mouth at bedtime.   valsartan (DIOVAN) 40 MG tablet Take 1.5 tablets (60 mg total) by mouth daily.   No facility-administered encounter medications on file as of 01/26/2023.    Allergies (verified) Amoxicillin and Penicillins   History: Past Medical History:  Diagnosis Date   Arthritis    Basal cell carcinoma 08/23/2009   Right dorsum lat. distal forearm.    Basal cell carcinoma 06/29/2021   R forehead - tx with ED&C   Diabetes mellitus, type 2 (HCC)    Dysplastic nevus 08/03/2008   Right ant. thigh, just above knee. Moderate  to marked atypia, extends to base. Excised 09/23/2008, margins free.   Dysplastic nevus 08/23/2009   Right post. sup. thigh. MIld atypia, limited margins free.   Glaucoma    Hx of basal cell carcinoma    R cheek   Hx of squamous cell carcinoma of skin 11/01/2009   L medial mid calf   Hyperlipidemia    Hypothyroidism    Insomnia    Left hip pain    Protein calorie malnutrition (HCC)    Squamous cell carcinoma of skin 08/23/2009   Left medial mid calf. SCCis   Past Surgical History:  Procedure Laterality Date   CATARACT EXTRACTION W/PHACO Left 12/31/2019   Procedure: CATARACT EXTRACTION PHACO AND INTRAOCULAR LENS PLACEMENT (IOC) LEFT  DIABETIC 10.20  01:17.6  13.1%;  Surgeon: Lockie Mola, MD;  [facility];  Service: Ophthalmology;  Laterality: Left;  Diabetic - oral meds   CATARACT EXTRACTION W/PHACO Right 01/28/2020   Procedure: CATARACT EXTRACTION PHACO AND INTRAOCULAR LENS PLACEMENT (IOC) RIGHT DIABETIC 13.67 01:34.5 14.5%;  Surgeon: Lockie Mola, MD;  Location: [facility];  Service: Ophthalmology;  Laterality: Right;   COLONOSCOPY WITH PROPOFOL N/A 12/31/2017   Procedure: COLONOSCOPY WITH PROPOFOL;  Surgeon: Scot Jun, MD;  Location: [facility];  Service: Endoscopy;  Laterality: N/A;   HERNIA REPAIR     TOTAL ABDOMINAL HYSTERECTOMY  2009   Family History  Problem Relation Age of Onset   Diabetes Mellitus II Mother    Stroke Father    Breast cancer Sister 93   Breast cancer Sister    Stroke Maternal Grandmother    Heart disease Paternal Grandfather    Social History   Socioeconomic History   Marital status: Widowed    Spouse name: Not on file   Number of children: Not on file   Years of education: Not on file   Highest education level: High school graduate  Occupational History   Not on file  Tobacco Use   Smoking status: Never   Smokeless tobacco: Never  Vaping Use   Vaping status: Never Used  Substance and Sexual Activity   Alcohol use: No    Alcohol/week: 0.0 standard drinks of alcohol   Drug use: No   Sexual activity: Not on file  Other Topics Concern   Not on file  Social History Narrative   Not on file   Social Determinants of Health   Financial Resource Strain: Low Risk  (01/26/2023)   Overall Financial Resource Strain (CARDIA)    Difficulty of Paying Living Expenses: Not hard at all  Food Insecurity: No Food Insecurity (01/26/2023)   Hunger Vital Sign    Worried About Running Out of Food in the Last Year: Never true    Ran Out of Food in the Last Year: Never true  Transportation Needs: No Transportation Needs (01/26/2023)   PRAPARE -  Administrator, Civil Service (Medical): No    Lack of Transportation (Non-Medical): No  Physical Activity: Insufficiently Active (01/26/2023)   Exercise Vital Sign    Days of Exercise per Week: 3 days    Minutes of Exercise per Session: 30 min  Stress: No Stress Concern Present (01/26/2023)   [employer] of Occupational Health - Occupational Stress Questionnaire    Feeling of Stress : Only a little  Social Connections: Moderately Isolated (01/26/2023)   Social Connection and Isolation Panel [NHANES]    Frequency of Communication with Friends and Family: More than three times a week  Frequency of Social Gatherings with Friends and Family: Never    Attends Religious Services: More than 4 times per year    Active Member of Clubs or Organizations: No    Attends Banker Meetings: Never    Marital Status: Widowed    Tobacco Counseling Counseling given: Not Answered   Clinical Intake:  Pre-visit preparation completed: Yes  Pain : No/denies pain     Nutritional Status: BMI <19  Underweight Nutritional Risks: None Diabetes: Yes CBG done?: No Did pt. bring in CBG monitor from home?: No  How often do you need to have someone help you when you read instructions, pamphlets, or other written materials from your doctor or pharmacy?: 1 - Never  Interpreter Needed?: No  Information entered by :: Kennedy Bucker, LPN   Activities of Daily Living    01/26/2023    9:15 AM 10/04/2022    8:45 AM  In your present state of health, do you have any difficulty performing the following activities:  Hearing? 0 0  Vision? 0 0  Difficulty concentrating or making decisions? 0 0  Walking or climbing stairs? 0 0  Dressing or bathing? 0 0  Doing errands, shopping? 0 0  Preparing Food and eating ? N   Using the Toilet? N   In the past six months, have you accidently leaked urine? N   Do you have problems with loss of bowel control? N   Managing your Medications? N    Managing your Finances? N   Housekeeping or managing your Housekeeping? N     Patient Care Team: Smitty Cords, DO as PCP - General (Family Medicine) [facility],  Eye Care (Optometry)  Indicate any recent Medical Services you may have received from other than [facility] providers in the past year (date may be approximate).     Assessment:   This is a routine wellness examination for Clarine.  Hearing/Vision screen Hearing Screening - Comments:: No aids Vision Screening - Comments:: Readers- Dr.Brasington   Goals Addressed             This Visit's Progress    DIET - INCREASE WATER INTAKE         Depression Screen    01/26/2023    9:12 AM 10/04/2022    8:45 AM 04/04/2022    8:38 AM 01/20/2022    9:32 AM 01/11/2021    2:41 PM 03/17/2020    9:00 AM 01/06/2020    1:25 PM  PHQ 2/9 Scores  PHQ - 2 Score 0 0 0 1 0 0 0  PHQ- 9 Score 0 0 0 1       Fall Risk    01/26/2023    9:14 AM 10/04/2022    8:46 AM 01/20/2022    9:35 AM 01/11/2021    2:41 PM 10/26/2020    9:31 AM  Fall Risk   Falls in the past year? 1 0 1 1 1   Comment    tripped   Number falls in past yr: 0 0 1 0 1  Injury with Fall? 0 0 0 0 0  Risk for fall due to : History of fall(s) No Fall Risks History of fall(s) Medication side effect   Follow up Falls evaluation completed;Falls prevention discussed Falls evaluation completed Falls evaluation completed;Falls prevention discussed Falls evaluation completed;Education provided;Falls prevention discussed     MEDICARE RISK AT HOME: Medicare Risk at Home Any stairs in or around the home?: Yes If so, are there any without handrails?:  No Home free of loose throw rugs in walkways, pet beds, electrical cords, etc?: Yes Adequate lighting in your home to reduce risk of falls?: Yes Life alert?: No Use of a cane, walker or w/c?: Yes (cane occasionally) Grab bars in the bathroom?: Yes Shower chair or bench in shower?: Yes Elevated toilet seat or a handicapped  toilet?: Yes  TIMED UP AND GO:  Was the test performed?  Yes  Length of time to ambulate 10 feet: 4 sec Gait steady and fast without use of assistive device    Cognitive Function:    11/06/2014    2:24 PM  MMSE - Mini Mental State Exam  Orientation to time 5  Orientation to Place 5  Registration 3  Attention/ Calculation 5  Recall 3  Language- name 2 objects 2  Language- repeat 1  Language- follow 3 step command 3  Language- read & follow direction 1  Write a sentence 1  Copy design 1  Total score 30        01/26/2023    9:16 AM 01/20/2022    9:40 AM 01/11/2021    2:42 PM 01/06/2020    1:27 PM 11/19/2018    9:28 AM  6CIT Screen  What Year? 0 points 0 points 0 points 0 points 0 points  What month? 0 points 0 points 0 points 0 points 0 points  What time? 0 points 0 points 0 points 0 points 0 points  Count back from 20 0 points 0 points 0 points 0 points 0 points  Months in reverse 0 points 0 points 0 points 2 points 0 points  Repeat phrase 0 points 0 points 0 points 0 points 0 points  Total Score 0 points 0 points 0 points 2 points 0 points    Immunizations Immunization History  Administered Date(s) Administered   Fluad Quad(high Dose 65+) 01/07/2020, 03/22/2021, 04/04/2022   Influenza, High Dose Seasonal PF 01/05/2015, 02/24/2016, 01/16/2017, 01/14/2018, 01/06/2019   Influenza-Unspecified 01/28/2014, 01/14/2018   PFIZER Comirnaty(Gray Top)Covid-19 Tri-Sucrose Vaccine 05/14/2019, 06/07/2019   PFIZER(Purple Top)SARS-COV-2 Vaccination 05/14/2019, 06/07/2019, 02/25/2020   Pneumococcal Conjugate-13 12/03/2013   Pneumococcal Polysaccharide-23 10/24/2016   Tdap 12/03/2013   Zoster Recombinant(Shingrix) 12/04/2018, 02/17/2019, 03/06/2019   Zoster, Live 04/24/2010    TDAP status: Up to date  Flu Vaccine status: Completed at today's visit  Pneumococcal vaccine status: Up to date  Covid-19 vaccine status: Completed vaccines  Qualifies for Shingles Vaccine? Yes    Zostavax completed Yes   Shingrix Completed?: Yes  Screening Tests Health Maintenance  Topic Date Due   INFLUENZA VACCINE  11/23/2022   COVID-19 Vaccine (6 - 2023-24 season) 12/24/2022   Diabetic kidney evaluation - eGFR measurement  03/29/2023   FOOT EXAM  04/05/2023   HEMOGLOBIN A1C  04/05/2023   OPHTHALMOLOGY EXAM  09/14/2023   Diabetic kidney evaluation - Urine ACR  10/04/2023   DTaP/Tdap/Td (2 - Td or Tdap) 12/04/2023   Medicare Annual Wellness (AWV)  01/26/2024   Pneumonia Vaccine 76+ Years old  Completed   DEXA SCAN  Completed   Zoster Vaccines- Shingrix  Completed   HPV VACCINES  Aged Out    Health Maintenance  Health Maintenance Due  Topic Date Due   INFLUENZA VACCINE  11/23/2022   COVID-19 Vaccine (6 - 2023-24 season) 12/24/2022    Colorectal cancer screening: No longer required.   Mammogram status: No longer required due to age.   Lung Cancer Screening: (Low Dose CT Chest recommended if Age 30-80 years, 20 pack-year  currently smoking OR have quit w/in 15years.) does not qualify.    Additional Screening:  Hepatitis C Screening: does not qualify; Completed no  Vision Screening: Recommended annual ophthalmology exams for early detection of glaucoma and other disorders of the eye. Is the patient up to date with their annual eye exam?  Yes  Who is the provider or what is the name of the office in which the patient attends annual eye exams? Dr.Brasington If pt is not established with a provider, would they like to be referred to a provider to establish care? No .   Dental Screening: Recommended annual dental exams for proper oral hygiene  Diabetic Foot Exam: Diabetic Foot Exam: Completed 04/04/22  Community Resource Referral / Chronic Care Management: CRR required this visit?  No   CCM required this visit?  No     Plan:     I have personally reviewed and noted the following in the patient's chart:   Medical and social history Use of alcohol,  tobacco or illicit drugs  Current medications and supplements including opioid prescriptions. Patient is not currently taking opioid prescriptions. Functional ability and status Nutritional status Physical activity Advanced directives List of other physicians Hospitalizations, surgeries, and ER visits in previous 12 months Vitals Screenings to include cognitive, depression, and falls Referrals and appointments  In addition, I have reviewed and discussed with patient certain preventive protocols, quality metrics, and best practice recommendations. A written personalized care plan for preventive services as well as general preventive health recommendations were provided to patient.     Hal Hope, LPN   [id number]/11/[id number]   After Visit Summary: no  Nurse Notes: none- flu shot given

## 2023-03-19 DIAGNOSIS — H40001 Preglaucoma, unspecified, right eye: Secondary | ICD-10-CM | POA: Diagnosis not present

## 2023-03-19 DIAGNOSIS — Z961 Presence of intraocular lens: Secondary | ICD-10-CM | POA: Diagnosis not present

## 2023-03-19 DIAGNOSIS — H43822 Vitreomacular adhesion, left eye: Secondary | ICD-10-CM | POA: Diagnosis not present

## 2023-03-19 DIAGNOSIS — H401122 Primary open-angle glaucoma, left eye, moderate stage: Secondary | ICD-10-CM | POA: Diagnosis not present

## 2023-03-30 ENCOUNTER — Other Ambulatory Visit: Payer: Medicare HMO

## 2023-03-30 ENCOUNTER — Other Ambulatory Visit: Payer: Self-pay | Admitting: Family Medicine

## 2023-03-30 DIAGNOSIS — E440 Moderate protein-calorie malnutrition: Secondary | ICD-10-CM

## 2023-03-30 DIAGNOSIS — E1169 Type 2 diabetes mellitus with other specified complication: Secondary | ICD-10-CM

## 2023-03-30 DIAGNOSIS — I10 Essential (primary) hypertension: Secondary | ICD-10-CM

## 2023-03-30 DIAGNOSIS — E7800 Pure hypercholesterolemia, unspecified: Secondary | ICD-10-CM

## 2023-03-30 DIAGNOSIS — E119 Type 2 diabetes mellitus without complications: Secondary | ICD-10-CM

## 2023-03-30 DIAGNOSIS — E039 Hypothyroidism, unspecified: Secondary | ICD-10-CM

## 2023-03-30 DIAGNOSIS — Z0000 Encounter for general adult medical examination without abnormal findings: Secondary | ICD-10-CM

## 2023-03-30 NOTE — Telephone Encounter (Signed)
Requested Prescriptions  Pending Prescriptions Disp Refills   ONETOUCH VERIO test strip [Pharmacy Med Name: ONE TOUCH VERIO TEST STRIP] 100 strip 0    Sig: USE TO CHECK BLOOD SUGAR ONCE A DAY. DX E11.9     Endocrinology: Diabetes - Testing Supplies Passed - 03/30/2023  1:44 AM      Passed - Valid encounter within last 12 months    Recent Outpatient Visits           5 months ago Type 2 diabetes mellitus with other specified complication, without long-term current use of insulin [facility])   [facility] [place], Netta Neat, DO   12 months ago Annual physical exam   [place] [facility] Smitty Cords, DO   1 year ago Type 2 diabetes mellitus with other specified complication, without long-term current use of insulin [facility])   [place] [facility] Althea Charon, Netta Neat, DO   2 years ago Annual physical exam   [place] [facility] Smitty Cords, DO   2 years ago Type 2 diabetes mellitus with other specified complication, without long-term current use of insulin [facility])   [place] [facility] [place], Netta Neat, DO       Future Appointments             In 1 week Althea Charon, Netta Neat, DO [facility], [place]   In 3 months Deirdre Evener, MD [facility]

## 2023-03-31 LAB — COMPLETE METABOLIC PANEL WITH GFR
AG Ratio: 1.6 (calc) (ref 1.0–2.5)
ALT: 24 U/L (ref 6–29)
AST: 22 U/L (ref 10–35)
Albumin: 4.5 g/dL (ref 3.6–5.1)
Alkaline phosphatase (APISO): 99 U/L (ref 37–153)
BUN/Creatinine Ratio: 25 (calc) — ABNORMAL HIGH (ref 6–22)
BUN: 14 mg/dL (ref 7–25)
CO2: 29 mmol/L (ref 20–32)
Calcium: 10.1 mg/dL (ref 8.6–10.4)
Chloride: 95 mmol/L — ABNORMAL LOW (ref 98–110)
Creat: 0.57 mg/dL — ABNORMAL LOW (ref 0.60–0.95)
Globulin: 2.9 g/dL (ref 1.9–3.7)
Glucose, Bld: 168 mg/dL — ABNORMAL HIGH (ref 65–99)
Potassium: 5.1 mmol/L (ref 3.5–5.3)
Sodium: 132 mmol/L — ABNORMAL LOW (ref 135–146)
Total Bilirubin: 0.5 mg/dL (ref 0.2–1.2)
Total Protein: 7.4 g/dL (ref 6.1–8.1)
eGFR: 89 mL/min/{1.73_m2} (ref >=60)

## 2023-03-31 LAB — CBC WITH DIFFERENTIAL/PLATELET
Absolute Lymphocytes: 1638 {cells}/uL (ref 850–3900)
Absolute Monocytes: 387 {cells}/uL (ref 200–950)
Basophils Absolute: 32 {cells}/uL (ref 0–200)
Basophils Relative: 0.7 %
Eosinophils Absolute: 50 {cells}/uL (ref 15–500)
Eosinophils Relative: 1.1 %
HCT: 43.2 % (ref 35.0–45.0)
Hemoglobin: 14.3 g/dL (ref 11.7–15.5)
MCH: 31 pg (ref 27.0–33.0)
MCHC: 33.1 g/dL (ref 32.0–36.0)
MCV: 93.5 fL (ref 80.0–100.0)
MPV: 10.1 fL (ref 7.5–12.5)
Monocytes Relative: 8.6 %
Neutro Abs: 2394 {cells}/uL (ref 1500–7800)
Neutrophils Relative %: 53.2 %
Platelets: 292 10*3/uL (ref 140–400)
RBC: 4.62 10*6/uL (ref 3.80–5.10)
RDW: 11.8 % (ref 11.0–15.0)
Total Lymphocyte: 36.4 %
WBC: 4.5 10*3/uL (ref 3.8–10.8)

## 2023-03-31 LAB — LIPID PANEL
Cholesterol: 138 mg/dL (ref <200)
HDL: 71 mg/dL (ref >=50)
LDL Cholesterol (Calc): 53 mg/dL
Non-HDL Cholesterol (Calc): 67 mg/dL (ref <130)
Total CHOL/HDL Ratio: 1.9 (calc) (ref <5.0)
Triglycerides: 61 mg/dL (ref <150)

## 2023-03-31 LAB — TSH: TSH: 1.95 m[IU]/L (ref 0.40–4.50)

## 2023-03-31 LAB — HEMOGLOBIN A1C
Hgb A1c MFr Bld: 8.7 %{Hb} — ABNORMAL HIGH (ref <5.7)
Mean Plasma Glucose: 203 mg/dL
eAG (mmol/L): 11.2 mmol/L

## 2023-03-31 LAB — T4, FREE: Free T4: 1.2 ng/dL (ref 0.8–1.8)

## 2023-04-06 ENCOUNTER — Encounter: Payer: Self-pay | Admitting: Family Medicine

## 2023-04-06 ENCOUNTER — Ambulatory Visit (INDEPENDENT_AMBULATORY_CARE_PROVIDER_SITE_OTHER): Payer: Medicare HMO | Admitting: Family Medicine

## 2023-04-06 VITALS — BP 138/68 | HR 70 | Ht 61.0 in | Wt 87.0 lb

## 2023-04-06 DIAGNOSIS — K5900 Constipation, unspecified: Secondary | ICD-10-CM | POA: Diagnosis not present

## 2023-04-06 DIAGNOSIS — I10 Essential (primary) hypertension: Secondary | ICD-10-CM | POA: Diagnosis not present

## 2023-04-06 DIAGNOSIS — E039 Hypothyroidism, unspecified: Secondary | ICD-10-CM | POA: Diagnosis not present

## 2023-04-06 DIAGNOSIS — E7800 Pure hypercholesterolemia, unspecified: Secondary | ICD-10-CM | POA: Diagnosis not present

## 2023-04-06 DIAGNOSIS — Z0000 Encounter for general adult medical examination without abnormal findings: Secondary | ICD-10-CM

## 2023-04-06 DIAGNOSIS — E440 Moderate protein-calorie malnutrition: Secondary | ICD-10-CM | POA: Diagnosis not present

## 2023-04-06 DIAGNOSIS — E1169 Type 2 diabetes mellitus with other specified complication: Secondary | ICD-10-CM | POA: Diagnosis not present

## 2023-04-06 MED ORDER — POLYETHYLENE GLYCOL 3350 17 GM/SCOOP PO POWD: 17.0000 g | Freq: Every day | ORAL | 3 refills | Status: AC | PRN

## 2023-04-06 NOTE — Assessment & Plan Note (Signed)
Stable, controlled hypothyroidism Continue Levothyroxine 75mg daily

## 2023-04-06 NOTE — Assessment & Plan Note (Signed)
Hypertension Blood pressure 142/66 today. Improved repeat Patient currently on Valsartan 60mg  daily. Discussed the risks/benefits of increasing Valsartan if blood pressure readings increase to 140s/150s. -Continue Valsartan 60mg  daily. -Increase to 80mg  daily if blood pressure readings consistently in the 140s/150s.

## 2023-04-06 NOTE — Assessment & Plan Note (Signed)
Elevated A1c to 8.7 now, prior 7.7 Age 87, so we have discussed ranges of A1c < 8.0 acceptable Complicated by hypothyroidism and hyperlipidemia  Plan -Increase Metformin XR to 500mg  in AM and 1000mg  in PM with meals -Check blood glucose levels and report back in one month. -She will need new order Metformin for inc pill count if she prefers this dose increase plan

## 2023-04-06 NOTE — Patient Instructions (Addendum)
Thank you for coming to the office today.  For the Metformin Take one tablet 500mg  in AM with meal Take TWO tablets now 500mg  x 2 = 1000mg  with PM meal 3 per day  Call after 1 month with some blood sugar numbers, and general update, if doing well, without side effect or issues, request a new order with 3 per day.  --------------------------  Monitor BP closely, once a week  If readings >140-150+ you can increase to TWO pills Valsartan 40mg  x 2 = 80mg   For now, if BP normal - Continue to Take Valsartan 40mg  + half Valsartan = 40 + 20 = 60mg  daily  ---------------------------------------  For Constipation (less frequent bowel movement that can be hard dry or involve straining).  Recommend trying OTC Miralax 17g = 1 capful in large glass water once daily for now, try several days to see if working, goal is soft stool or BM 1-2 times daily, if too loose then reduce dose or try every other day. If not effective may need to increase it to 2 doses at once in AM or may do 1 in morning and 1 in afternoon/evening  - This medicine is very safe and can be used often without any problem and will not make you dehydrated. It is good for use on AS NEEDED BASIS or even MAINTENANCE therapy for longer term for several days to weeks at a time to help regulate bowel movements  Other more natural remedies or preventative treatment: - Increase hydration with water - Increase fiber in diet (high fiber foods = vegetables, leafy greens, oats/grains) - May take OTC Fiber supplement (metamucil powder or pill/gummy) - May try OTC Probiotic    Please schedule a Follow-up Appointment to: No follow-ups on file.  If you have any other questions or concerns, please feel free to call the office or send a message through MyChart. You may also schedule an earlier appointment if necessary.  Additionally, you may be receiving a survey about your experience at our office within a few days to 1 week by e-mail or mail.  We value your feedback.  Saralyn Pilar, DO [facility], [place]

## 2023-04-06 NOTE — Progress Notes (Signed)
Subjective:    Patient ID: Barbara Santos, female    DOB: November 19, 1935, 87 y.o.   MRN: [id number]  [name] is a 87 y.o. female presenting on 04/06/2023 for Follow-up (Difficulty with sugar regulation)   HPI  Discussed the use of AI scribe software for clinical note transcription with the patient, who gave verbal consent to proceed.      CHRONIC DM, Type 2: BMI >16 moderate protein calorie malnutrition - Today reports doing well on medicine overall CBGs: Elevated,  OneTouch Verio Meds: Metformin XR 500mg  BID Currently on ARB Lifestyle: - Diet - often eating baked chicken and higher protein supplement drinks. 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  The patient also mentions concerns about her toenails, which have become thick and difficult to manage. She expresses a desire to seek professional help for this issue due to her diabetes.   CHRONIC HTN: BP remains stable at home. Home Reading 130s-140s. Sometimes higher Current Meds - Valsartan 60mg  daily - (takes 1.5 tab of 40mg )   Reports good compliance, took meds today. Tolerating well, w/o complaints. Lifestyle: - Diet: Limited sodium diet now, avoid salt added. Limited meats Admits mild HA in AM self limited Denies CP, dyspnea, edema, dizziness / lightheadedness   HYPERLIPIDEMIA: - Reports no concerns. Last lipid panel 03/2023, controlled  - Currently taking Rosuvastatin 5mg , tolerating well without side effects or myalgias   Hypothyroidism Controlled on labs, Continues Levothyroxine dose daily.   The patient lives alone and does not own a cell phone, but she has a home alert system for emergencies. She expresses some resistance to new technology but acknowledges the  importance of being able to reach out for help when needed. She also mentions the stress of family members' health issues and the impact of rising costs on her ability to manage her health.   Health Maintenance: Updated on vaccines.     04/06/2023    9:52 AM 01/26/2023    9:12 AM 10/04/2022    8:45 AM  Depression screen PHQ 2/9  Decreased Interest 0 0 0  Down, Depressed, Hopeless 0 0 0  PHQ - 2 Score 0 0 0  Altered sleeping 3 0 0  Tired, decreased energy 0 0 0  Change in appetite 0 0 0  Feeling bad or failure about yourself  0 0 0  Trouble concentrating 0 0 0  Moving slowly or fidgety/restless 0 0 0  Suicidal thoughts 0 0 0  PHQ-9 Score 3 0 0  Difficult doing work/chores  Not difficult at all Not difficult at all       04/06/2023    9:52 AM 10/04/2022    8:45 AM 04/04/2022    8:38 AM  GAD 7 : Generalized Anxiety Score  Nervous, Anxious, on Edge 0 0 0  Control/stop worrying 0 0 0  Worry too much - different things 0 0 0  Trouble relaxing 0 0 0  Restless 0 0 0  Easily annoyed or irritable 0 0 0  Afraid - awful might happen 0 0 0  Total GAD 7 Score 0 0 0  Anxiety Difficulty  Not difficult at all Not difficult at all     Past Medical History:  Diagnosis Date   Arthritis  Basal cell carcinoma 08/23/2009   Right dorsum lat. distal forearm.    Basal cell carcinoma 06/29/2021   R forehead - tx with ED&C   Diabetes mellitus, type 2 (HCC)    Dysplastic nevus 08/03/2008   Right ant. thigh, just above knee. Moderate to marked atypia, extends to base. Excised 09/23/2008, margins free.   Dysplastic nevus 08/23/2009   Right post. sup. thigh. MIld atypia, limited margins free.   Glaucoma    Hx of basal cell carcinoma    R cheek   Hx of squamous cell carcinoma of skin 11/01/2009   L medial mid calf   Hyperlipidemia    Hypothyroidism    Insomnia    Left hip pain    Protein calorie malnutrition (HCC)    Squamous cell carcinoma of skin 08/23/2009   Left medial mid calf.  SCCis   Past Surgical History:  Procedure Laterality Date   CATARACT EXTRACTION W/PHACO Left 12/31/2019   Procedure: CATARACT EXTRACTION PHACO AND INTRAOCULAR LENS PLACEMENT (IOC) LEFT DIABETIC 10.20  01:17.6  13.1%;  Surgeon: Lockie Mola, MD;  Location: [facility];  Service: Ophthalmology;  Laterality: Left;  Diabetic - oral meds   CATARACT EXTRACTION W/PHACO Right 01/28/2020   Procedure: CATARACT EXTRACTION PHACO AND INTRAOCULAR LENS PLACEMENT (IOC) RIGHT DIABETIC 13.67 01:34.5 14.5%;  Surgeon: Lockie Mola, MD;  Location: [facility];  Service: Ophthalmology;  Laterality: Right;   COLONOSCOPY WITH PROPOFOL N/A 12/31/2017   Procedure: COLONOSCOPY WITH PROPOFOL;  Surgeon: Scot Jun, MD;  Location: [facility];  Service: Endoscopy;  Laterality: N/A;   HERNIA REPAIR     TOTAL ABDOMINAL HYSTERECTOMY  2009   Social History   Socioeconomic History   Marital status: Widowed    Spouse name: Not on file   Number of children: Not on file   Years of education: Not on file   Highest education level: High school graduate  Occupational History   Not on file  Tobacco Use   Smoking status: Never   Smokeless tobacco: Never  Vaping Use   Vaping status: Never Used  Substance and Sexual Activity   Alcohol use: No    Alcohol/week: 0.0 standard drinks of alcohol   Drug use: No   Sexual activity: Not on file  Other Topics Concern   Not on file  Social History Narrative   Not on file   Social Drivers of Health   Financial Resource Strain: Low Risk  (04/06/2023)   Overall Financial Resource Strain (CARDIA)    Difficulty of Paying Living Expenses: Not hard at all  Food Insecurity: No Food Insecurity (01/26/2023)   Hunger Vital Sign    Worried About Running Out of Food in the Last Year: Never true    Ran Out of Food in the Last Year: Never true  Transportation Needs: No Transportation Needs (01/26/2023)   PRAPARE - Administrator, Civil Service  (Medical): No    Lack of Transportation (Non-Medical): No  Physical Activity: Insufficiently Active (04/06/2023)   Exercise Vital Sign    Days of Exercise per Week: 1 day    Minutes of Exercise per Session: 10 min  Stress: No Stress Concern Present (04/06/2023)   [employer] of Occupational Health - Occupational Stress Questionnaire    Feeling of Stress : Only a little  Social Connections: Moderately Isolated (04/06/2023)   Social Connection and Isolation Panel [NHANES]    Frequency of Communication with Friends and Family: More than three times a week  Frequency of Social Gatherings with Friends and Family: Once a week    Attends Religious Services: More than 4 times per year    Active Member of [employer] or Organizations: No    Attends Banker Meetings: Never    Marital Status: Widowed  Intimate Partner Violence: Not At Risk (04/06/2023)   Humiliation, Afraid, Rape, and Kick questionnaire    Fear of Current or Ex-Partner: No    Emotionally Abused: No    Physically Abused: No    Sexually Abused: No   Family History  Problem Relation Age of Onset   Diabetes Mellitus II Mother    Stroke Father    Breast cancer Sister 22   Breast cancer Sister    Stroke Maternal Grandmother    Heart disease Paternal Grandfather    Current Outpatient Medications on File Prior to Visit  Medication Sig   Blood Glucose Monitoring Suppl (ONETOUCH VERIO IQ SYSTEM) w/Device KIT Use to check blood sugar up to 2 x daily   Cholecalciferol (VITAMIN D) 2000 UNITS tablet Take 2,000 Units by mouth daily.   diphenhydrAMINE (BENADRYL) 25 MG tablet Take 25 mg by mouth every 6 (six) hours as needed.   dorzolamide-timolol (COSOPT) 22.3-6.8 MG/ML ophthalmic solution Place 1 drop into both eyes 2 (two) times daily.    ketoconazole (NIZORAL) 2 % cream Apply 1 Application topically at bedtime. Apply to feet   lactose free nutrition (BOOST) LIQD Take 237 mLs by mouth 3 (three) times daily between  meals.   levothyroxine (SYNTHROID) 50 MCG tablet TAKE 1 TABLET DAILY BEFORE BREAKFAST   metFORMIN (GLUCOPHAGE-XR) 500 MG 24 hr tablet Take 1 tablet (500 mg total) by mouth 2 (two) times daily with a meal.   Multiple Vitamin (MULTIVITAMIN) tablet Take 1 tablet by mouth daily.   Nutritional Supplements (JUICE PLUS FIBRE PO) Take by mouth.   ONETOUCH DELICA LANCETS FINE MISC 1 each by Other route 3 (three) times daily.   ONETOUCH VERIO test strip USE TO CHECK BLOOD SUGAR ONCE A DAY. DX E11.9   rosuvastatin (CRESTOR) 5 MG tablet Take 1 tablet (5 mg total) by mouth at bedtime.   valsartan (DIOVAN) 40 MG tablet Take 1.5 tablets (60 mg total) by mouth daily.   No current facility-administered medications on file prior to visit.    Review of Systems Per HPI unless specifically indicated above     Objective:    BP 138/68 (BP Location: Left Arm, Cuff Size: Normal)   Pulse 70   Ht 5\' 1"  (1.549 m)   Wt 87 lb (39.5 kg)   BMI 16.44 kg/m   Wt Readings from Last 3 Encounters:  04/06/23 87 lb (39.5 kg)  01/26/23 86 lb 9.6 oz (39.3 kg)  10/04/22 88 lb 3.2 oz (40 kg)    Physical Exam Vitals and nursing note reviewed.  Constitutional:      General: She is not in acute distress.    Appearance: She is well-developed. She is not diaphoretic.     Comments: Well-appearing, comfortable, cooperative. Thin elderly 87 yr female.  HENT:     Head: Normocephalic and atraumatic.  Eyes:     General:        Right eye: No discharge.        Left eye: No discharge.     Conjunctiva/sclera: Conjunctivae normal.  Neck:     Thyroid: No thyromegaly.  Cardiovascular:     Rate and Rhythm: Normal rate and regular rhythm.     Heart sounds:  Normal heart sounds. No murmur heard. Pulmonary:     Effort: Pulmonary effort is normal. No respiratory distress.     Breath sounds: Normal breath sounds. No wheezing or rales.  Musculoskeletal:        General: Normal range of motion.     Cervical back: Normal range of  motion and neck supple.  Lymphadenopathy:     Cervical: No cervical adenopathy.  Skin:    General: Skin is warm and dry.     Findings: No erythema or rash.  Neurological:     Mental Status: She is alert and oriented to person, place, and time.  Psychiatric:        Behavior: Behavior normal.     Comments: Well groomed, good eye contact, normal speech and thoughts     Diabetic Foot Exam - Simple   Simple Foot Form Diabetic Foot exam was performed with the following findings: Yes 04/06/2023 10:38 AM  Visual Inspection See comments: Yes Sensation Testing Intact to touch and monofilament testing bilaterally: Yes Pulse Check Posterior Tibialis and Dorsalis pulse intact bilaterally: Yes Comments Thickened toenails bilateral great toenails.      Results for orders placed or performed in visit on 03/30/23  T4, free   Collection Time: 03/30/23  8:17 AM  Result Value Ref Range   Free T4 1.2 0.8 - 1.8 ng/dL  TSH   Collection Time: 03/30/23  8:17 AM  Result Value Ref Range   TSH 1.95 0.40 - 4.50 mIU/L  Hemoglobin A1c   Collection Time: 03/30/23  8:17 AM  Result Value Ref Range   Hgb A1c MFr Bld 8.7 (H) <5.7 % of total Hgb   Mean Plasma Glucose 203 mg/dL   eAG (mmol/L) 16.1 mmol/L  Lipid panel   Collection Time: 03/30/23  8:17 AM  Result Value Ref Range   Cholesterol 138 <200 mg/dL   HDL 71 > OR = 50 mg/dL   Triglycerides 61 <096 mg/dL   LDL Cholesterol (Calc) 53 mg/dL (calc)   Total CHOL/HDL Ratio 1.9 <5.0 (calc)   Non-HDL Cholesterol (Calc) 67 <045 mg/dL (calc)  COMPLETE METABOLIC PANEL WITH GFR   Collection Time: 03/30/23  8:17 AM  Result Value Ref Range   Glucose, Bld 168 (H) 65 - 99 mg/dL   BUN 14 7 - 25 mg/dL   Creat 4.09 (L) 8.11 - 0.95 mg/dL   eGFR 89 > OR = 60 BJ/YNW/2.95A2   BUN/Creatinine Ratio 25 (H) 6 - 22 (calc)   Sodium 132 (L) 135 - 146 mmol/L   Potassium 5.1 3.5 - 5.3 mmol/L   Chloride 95 (L) 98 - 110 mmol/L   CO2 29 20 - 32 mmol/L   Calcium 10.1  8.6 - 10.4 mg/dL   Total Protein 7.4 6.1 - 8.1 g/dL   Albumin 4.5 3.6 - 5.1 g/dL   Globulin 2.9 1.9 - 3.7 g/dL (calc)   AG Ratio 1.6 1.0 - 2.5 (calc)   Total Bilirubin 0.5 0.2 - 1.2 mg/dL   Alkaline phosphatase (APISO) 99 37 - 153 U/L   AST 22 10 - 35 U/L   ALT 24 6 - 29 U/L  CBC with Differential/Platelet   Collection Time: 03/30/23  8:17 AM  Result Value Ref Range   WBC 4.5 3.8 - 10.8 Thousand/uL   RBC 4.62 3.80 - 5.10 Million/uL   Hemoglobin 14.3 11.7 - 15.5 g/dL   HCT 13.0 86.5 - 78.4 %   MCV 93.5 80.0 - 100.0 fL   MCH 31.0 27.0 -  33.0 pg   MCHC 33.1 32.0 - 36.0 g/dL   RDW 40.9 81.1 - 91.4 %   Platelets 292 140 - 400 Thousand/uL   MPV 10.1 7.5 - 12.5 fL   Neutro Abs 2,394 1,500 - 7,800 cells/uL   Absolute Lymphocytes 1,638 850 - 3,900 cells/uL   Absolute Monocytes 387 200 - 950 cells/uL   Eosinophils Absolute 50 15 - 500 cells/uL   Basophils Absolute 32 0 - 200 cells/uL   Neutrophils Relative % 53.2 %   Total Lymphocyte 36.4 %   Monocytes Relative 8.6 %   Eosinophils Relative 1.1 %   Basophils Relative 0.7 %      Assessment & Plan:   Problem List Items Addressed This Visit     Constipation   Relevant Medications   polyethylene glycol powder (GLYCOLAX/MIRALAX) 17 GM/SCOOP powder   Elevated LDL cholesterol level   Essential hypertension   Hypertension Blood pressure 142/66 today. Improved repeat Patient currently on Valsartan 60mg  daily. Discussed the risks/benefits of increasing Valsartan if blood pressure readings increase to 140s/150s. -Continue Valsartan 60mg  daily. -Increase to 80mg  daily if blood pressure readings consistently in the 140s/150s.      Hypothyroidism   Stable, controlled hypothyroidism Continue Levothyroxine daily      Protein calorie malnutrition (HCC)   Type 2 diabetes mellitus with other specified complication (HCC)   Elevated A1c to 8.7 now, prior 7.7 Age 68, so we have discussed ranges of A1c < 8.0 acceptable Complicated by  hypothyroidism and hyperlipidemia  Plan -Increase Metformin XR to 500mg  in AM and 1000mg  in PM with meals -Check blood glucose levels and report back in one month. -She will need new order Metformin for inc pill count if she prefers this dose increase plan      Other Visit Diagnoses       Annual physical exam    -  Primary        Updated Health Maintenance information Reviewed recent lab results with patient Encouraged improvement to lifestyle with diet and exercise Goal of weight loss  Chronic Constipation Patient reports chronic constipation. Discussed the risks/benefits of increasing Miralax dose. -Increase Miralax to 3-4 capfuls as needed for constipation. -Report back in one month regarding efficacy and tolerance.  Hyponatremia Chronic issue. Monitor trends Counsel on electrolyte / hydration intake  Foot Care in Diabetes No current issues. Patient has difficulty cutting toenails due to thickness. -Consider referral to podiatry for foot care.  Follow-up in 6 months or sooner if needed.         No orders of the defined types were placed in this encounter.   Meds ordered this encounter  Medications   polyethylene glycol powder (GLYCOLAX/MIRALAX) 17 GM/SCOOP powder    Sig: Take 17-34 g by mouth daily as needed for moderate constipation. May increase up to 3 to 4 capfuls if needed short term for constipation.    Dispense:  238 g    Refill:  3     Follow up plan: Return in about 6 months (around 10/05/2023) for 6 month DM A1c, HTN.  Saralyn Pilar, DO [facility] 04/06/2023, 10:18 AM

## 2023-05-15 ENCOUNTER — Telehealth: Payer: Self-pay | Admitting: Family Medicine

## 2023-05-15 DIAGNOSIS — E1169 Type 2 diabetes mellitus with other specified complication: Secondary | ICD-10-CM

## 2023-05-15 MED ORDER — METFORMIN HCL ER 500 MG PO TB24: ORAL_TABLET | ORAL | 3 refills | Status: DC

## 2023-05-15 NOTE — Telephone Encounter (Signed)
Pt is calling in because she has been on Metformin for a little over a month and says Dr. Kirtland Bouchard told her to call him and tell him how the medication has been working. Pt is requesting Dr. Kirtland Bouchard give her a call when he gets a chance.

## 2023-05-15 NOTE — Telephone Encounter (Signed)
I attempted to call her but she did not answer 05/14/22 1145am  From chart review last seen 03/2023, we increased her dose of Metformin XR 500mg , she was on one tab TWICE a day with meal. And I asked her to take an EXTRA tab in evening with meal.  So now total should be 1 pill in AM and 2 pill in PM for dinner.  So mostly I just need to know if she tolerates the double dose in the evening and if feels like it is helping manage her sugars.  Last reported glucose of 118 sounds good to me.  I can re order her Metformin for 3 pills per day now.  Saralyn Pilar, DO [facility] 05/15/2023, 11:50 AM

## 2023-05-31 DIAGNOSIS — E119 Type 2 diabetes mellitus without complications: Secondary | ICD-10-CM | POA: Diagnosis not present

## 2023-05-31 DIAGNOSIS — M79674 Pain in right toe(s): Secondary | ICD-10-CM | POA: Diagnosis not present

## 2023-05-31 DIAGNOSIS — L600 Ingrowing nail: Secondary | ICD-10-CM | POA: Diagnosis not present

## 2023-05-31 DIAGNOSIS — M79675 Pain in left toe(s): Secondary | ICD-10-CM | POA: Diagnosis not present

## 2023-05-31 DIAGNOSIS — B351 Tinea unguium: Secondary | ICD-10-CM | POA: Diagnosis not present

## 2023-06-04 ENCOUNTER — Encounter: Payer: Self-pay | Admitting: Family Medicine

## 2023-06-04 ENCOUNTER — Ambulatory Visit: Payer: Medicare HMO | Admitting: Family Medicine

## 2023-06-04 VITALS — BP 122/78 | HR 90 | Ht 61.0 in | Wt 88.0 lb

## 2023-06-04 DIAGNOSIS — J0110 Acute frontal sinusitis, unspecified: Secondary | ICD-10-CM | POA: Diagnosis not present

## 2023-06-04 MED ORDER — AZITHROMYCIN 250 MG PO TABS: ORAL_TABLET | ORAL | 0 refills | Status: DC

## 2023-06-04 NOTE — Patient Instructions (Addendum)
 Thank you for coming to the office today.  Start nasal steroid Flonase 2 sprays in each nostril daily for 4-6 weeks, may repeat course seasonally or as needed  Keep on Mucinex   To prevent lower respiratory infection  Start Azithromycin  Z pak (antibiotic) 2 tabs day 1, then 1 tab x 4 days, complete entire course even if improved   Please schedule a Follow-up Appointment to: Return if symptoms worsen or fail to improve.  If you have any other questions or concerns, please feel free to call the office or send a message through MyChart. You may also schedule an earlier appointment if necessary.  Additionally, you may be receiving a survey about your experience at our office within a few days to 1 week by e-mail or mail. We value your feedback.  Domingo Friend, DO [facility], [place]

## 2023-06-04 NOTE — Progress Notes (Signed)
 Subjective:    Patient ID: Barbara Santos, female    DOB: 1935/05/09, 88 y.o.   MRN: [id number]  Barbara Santos is a 88 y.o. female presenting on 06/04/2023 for URI  Patient presents for a same day appointment.   HPI  Discussed the use of AI scribe software for clinical note transcription with the patient, who gave verbal consent to proceed.  History of Present Illness    Barbara Santos is an 88 year old female who presents with persistent cough and hoarseness.  She has been experiencing a persistent dry cough and hoarseness since late Saturday evening. The cough is dry, and she has difficulty expectorating mucus. The cough can lead to a headache, which she describes as a 'coughing spell'.  She experiences a headache and mild left ear pain. No current fever or chills, although she might have had them initially when the symptoms started. No sinus drainage or congestion.  She has been taking regular Mucinex  and using throat lozenges to manage her symptoms. She inquired about the difference between Mucinex  and Mucinex  DM but has only been using the regular Mucinex . Additionally, she uses a nasal spray, Flonase, two sprays on each side daily.  She has a history of not being able to take amoxicillin or penicillin due to allergies   Denies fever chills nausea vomiting            04/06/2023    9:52 AM 01/26/2023    9:12 AM 10/04/2022    8:45 AM  Depression screen PHQ 2/9  Decreased Interest 0 0 0  Down, Depressed, Hopeless 0 0 0  PHQ - 2 Score 0 0 0  Altered sleeping 3 0 0  Tired, decreased energy 0 0 0  Change in appetite 0 0 0  Feeling bad or failure about yourself  0 0 0  Trouble concentrating 0 0 0  Moving slowly or fidgety/restless 0 0 0  Suicidal thoughts 0 0 0  PHQ-9 Score 3 0 0  Difficult doing work/chores  Not difficult at all Not difficult at all       04/06/2023    9:52 AM 10/04/2022    8:45 AM 04/04/2022    8:38 AM  GAD 7 : Generalized Anxiety  Score  Nervous, Anxious, on Edge 0 0 0  Control/stop worrying 0 0 0  Worry too much - different things 0 0 0  Trouble relaxing 0 0 0  Restless 0 0 0  Easily annoyed or irritable 0 0 0  Afraid - awful might happen 0 0 0  Total GAD 7 Score 0 0 0  Anxiety Difficulty  Not difficult at all Not difficult at all    Social History   Tobacco Use   Smoking status: Never   Smokeless tobacco: Never  Vaping Use   Vaping status: Never Used  Substance Use Topics   Alcohol use: No    Alcohol/week: 0.0 standard drinks of alcohol   Drug use: No    Review of Systems Per HPI unless specifically indicated above     Objective:    BP 122/78   Pulse 90   Ht 5\' 1"  (1.549 m)   Wt 88 lb (39.9 kg)   SpO2 98%   BMI 16.63 kg/m   Wt Readings from Last 3 Encounters:  06/04/23 88 lb (39.9 kg)  04/06/23 87 lb (39.5 kg)  01/26/23 86 lb 9.6 oz (39.3 kg)    Physical Exam Vitals and nursing note reviewed.  Constitutional:      General: She is not in acute distress.    Appearance: She is well-developed. She is not diaphoretic.     Comments: Well-appearing, comfortable, cooperative  HENT:     Head: Normocephalic and atraumatic.  Eyes:     General:        Right eye: No discharge.        Left eye: No discharge.     Conjunctiva/sclera: Conjunctivae normal.  Neck:     Thyroid : No thyromegaly.  Cardiovascular:     Rate and Rhythm: Normal rate and regular rhythm.     Heart sounds: Normal heart sounds. No murmur heard. Pulmonary:     Effort: Pulmonary effort is normal. No respiratory distress.     Breath sounds: Normal breath sounds. No wheezing or rales.  Musculoskeletal:        General: Normal range of motion.     Cervical back: Normal range of motion and neck supple.  Lymphadenopathy:     Cervical: No cervical adenopathy.  Skin:    General: Skin is warm and dry.     Findings: No erythema or rash.  Neurological:     Mental Status: She is alert and oriented to person, place, and time.   Psychiatric:        Behavior: Behavior normal.     Comments: Well groomed, good eye contact, normal speech and thoughts     Results for orders placed or performed in visit on 03/30/23  T4, free   Collection Time: 03/30/23  8:17 AM  Result Value Ref Range   Free T4 1.2 0.8 - 1.8 ng/dL  TSH   Collection Time: 03/30/23  8:17 AM  Result Value Ref Range   TSH 1.95 0.40 - 4.50 mIU/L  Hemoglobin A1c   Collection Time: 03/30/23  8:17 AM  Result Value Ref Range   Hgb A1c MFr Bld 8.7 (H) <5.7 % of total Hgb   Mean Plasma Glucose 203 mg/dL   eAG (mmol/L) 78.2 mmol/L  Lipid panel   Collection Time: 03/30/23  8:17 AM  Result Value Ref Range   Cholesterol 138 <200 mg/dL   HDL 71 > OR = 50 mg/dL   Triglycerides 61 <956 mg/dL   LDL Cholesterol (Calc) 53 mg/dL (calc)   Total CHOL/HDL Ratio 1.9 <5.0 (calc)   Non-HDL Cholesterol (Calc) 67 <213 mg/dL (calc)  COMPLETE METABOLIC PANEL WITH GFR   Collection Time: 03/30/23  8:17 AM  Result Value Ref Range   Glucose, Bld 168 (H) 65 - 99 mg/dL   BUN 14 7 - 25 mg/dL   Creat 0.86 (L) 5.78 - 0.95 mg/dL   eGFR 89 > OR = 60 IO/NGE/9.52W4   BUN/Creatinine Ratio 25 (H) 6 - 22 (calc)   Sodium 132 (L) 135 - 146 mmol/L   Potassium 5.1 3.5 - 5.3 mmol/L   Chloride 95 (L) 98 - 110 mmol/L   CO2 29 20 - 32 mmol/L   Calcium  10.1 8.6 - 10.4 mg/dL   Total Protein 7.4 6.1 - 8.1 g/dL   Albumin 4.5 3.6 - 5.1 g/dL   Globulin 2.9 1.9 - 3.7 g/dL (calc)   AG Ratio 1.6 1.0 - 2.5 (calc)   Total Bilirubin 0.5 0.2 - 1.2 mg/dL   Alkaline phosphatase (APISO) 99 37 - 153 U/L   AST 22 10 - 35 U/L   ALT 24 6 - 29 U/L  CBC with Differential/Platelet   Collection Time: 03/30/23  8:17 AM  Result Value Ref  Range   WBC 4.5 3.8 - 10.8 Thousand/uL   RBC 4.62 3.80 - 5.10 Million/uL   Hemoglobin 14.3 11.7 - 15.5 g/dL   HCT 14.7 82.9 - 56.2 %   MCV 93.5 80.0 - 100.0 fL   MCH 31.0 27.0 - 33.0 pg   MCHC 33.1 32.0 - 36.0 g/dL   RDW 13.0 86.5 - 78.4 %   Platelets 292 140 -  400 Thousand/uL   MPV 10.1 7.5 - 12.5 fL   Neutro Abs 2,394 1,500 - 7,800 cells/uL   Absolute Lymphocytes 1,638 850 - 3,900 cells/uL   Absolute Monocytes 387 200 - 950 cells/uL   Eosinophils Absolute 50 15 - 500 cells/uL   Basophils Absolute 32 0 - 200 cells/uL   Neutrophils Relative % 53.2 %   Total Lymphocyte 36.4 %   Monocytes Relative 8.6 %   Eosinophils Relative 1.1 %   Basophils Relative 0.7 %      Assessment & Plan:   Problem List Items Addressed This Visit   None Visit Diagnoses       Acute non-recurrent frontal sinusitis    -  Primary   Relevant Medications   azithromycin  (ZITHROMAX  Z-PAK) 250 MG tablet        Sinusitis Symptoms started late Saturday evening, with hoarseness, dry cough, and headache. No fever currently. No signs of pneumonia or ear infection on examination. Patient is currently taking Mucinex  and throat lozenges.  -Continue Mucinex  and throat lozenges. -Start Flonase, two sprays on each side daily. -If symptoms do not improve, start Azithromycin  5-day course, prescription sent to [facility].         No orders of the defined types were placed in this encounter.   Meds ordered this encounter  Medications   azithromycin  (ZITHROMAX  Z-PAK) 250 MG tablet    Sig: Take 2 tabs (500mg  total) on Day 1. Take 1 tab (250mg ) daily for next 4 days.    Dispense:  6 tablet    Refill:  0    Follow up plan: Return if symptoms worsen or fail to improve.   Domingo Friend, DO [facility] 06/04/2023, 10:26 AM

## 2023-06-07 DIAGNOSIS — Z809 Family history of malignant neoplasm, unspecified: Secondary | ICD-10-CM | POA: Diagnosis not present

## 2023-06-07 DIAGNOSIS — Z823 Family history of stroke: Secondary | ICD-10-CM | POA: Diagnosis not present

## 2023-06-07 DIAGNOSIS — Z9181 History of falling: Secondary | ICD-10-CM | POA: Diagnosis not present

## 2023-06-07 DIAGNOSIS — I10 Essential (primary) hypertension: Secondary | ICD-10-CM | POA: Diagnosis not present

## 2023-06-07 DIAGNOSIS — E46 Unspecified protein-calorie malnutrition: Secondary | ICD-10-CM | POA: Diagnosis not present

## 2023-06-07 DIAGNOSIS — E119 Type 2 diabetes mellitus without complications: Secondary | ICD-10-CM | POA: Diagnosis not present

## 2023-06-07 DIAGNOSIS — H409 Unspecified glaucoma: Secondary | ICD-10-CM | POA: Diagnosis not present

## 2023-06-07 DIAGNOSIS — E039 Hypothyroidism, unspecified: Secondary | ICD-10-CM | POA: Diagnosis not present

## 2023-06-07 DIAGNOSIS — E785 Hyperlipidemia, unspecified: Secondary | ICD-10-CM | POA: Diagnosis not present

## 2023-06-07 DIAGNOSIS — Z7984 Long term (current) use of oral hypoglycemic drugs: Secondary | ICD-10-CM | POA: Diagnosis not present

## 2023-06-07 DIAGNOSIS — Z8249 Family history of ischemic heart disease and other diseases of the circulatory system: Secondary | ICD-10-CM | POA: Diagnosis not present

## 2023-06-07 DIAGNOSIS — Z880 Allergy status to penicillin: Secondary | ICD-10-CM | POA: Diagnosis not present

## 2023-06-08 ENCOUNTER — Ambulatory Visit: Payer: Medicare HMO | Admitting: Family Medicine

## 2023-06-08 ENCOUNTER — Encounter: Payer: Self-pay | Admitting: Family Medicine

## 2023-06-08 VITALS — BP 120/78 | HR 95 | Ht 61.0 in

## 2023-06-08 DIAGNOSIS — J0110 Acute frontal sinusitis, unspecified: Secondary | ICD-10-CM | POA: Diagnosis not present

## 2023-06-08 DIAGNOSIS — J9801 Acute bronchospasm: Secondary | ICD-10-CM | POA: Diagnosis not present

## 2023-06-08 MED ORDER — GUAIFENESIN-CODEINE 100-10 MG/5ML PO SOLN: 5.0000 mL | Freq: Three times a day (TID) | ORAL | 0 refills | Status: DC | PRN

## 2023-06-08 MED ORDER — PREDNISONE 10 MG PO TABS: ORAL_TABLET | ORAL | 0 refills | Status: DC

## 2023-06-08 NOTE — Patient Instructions (Addendum)
Thank you for coming to the office today.  Start Prednisone taper 5 day with food Cannot take with Advil Ibuprofen Motrin Aleve  Try to get the stronger codeine cough syrup as well, if they can do it at the pharmacy  Finish Azithromycin  We cannot do X-ray on Fridays. If you are worsening and want to check for pneumonia we can order it next week, let us know.  Please schedule a Follow-up Appointment to: Return if symptoms worsen or fail to improve.  If you have any other questions or concerns, please feel free to call the office or send a message through MyChart. You may also schedule an earlier appointment if necessary.  Additionally, you may be receiving a survey about your experience at our office within a few days to 1 week by e-mail or mail. We value your feedback.  Saralyn Pilar, DO [facility], [place]

## 2023-06-08 NOTE — Progress Notes (Signed)
Subjective:    Patient ID: Barbara Santos, female    DOB: 09-Jan-1936, 88 y.o.   MRN: [id number]  Barbara Santos is a 88 y.o. female presenting on 06/08/2023 for Cough   HPI  Discussed the use of AI scribe software for clinical note transcription with the patient, who gave verbal consent to proceed.  History of Present Illness   Barbara Santos is a 88 year old female who presents with persistent cough and concerns about respiratory symptoms.  She has a persistent cough that has been ongoing for several days. The cough is deep and she has difficulty expectorating sputum, although occasionally she is able to cough up a small amount. The cough is less frequent during the day but worsens at night, disrupting her sleep. No shortness of breath or fever is present.  She has been using Mucinex and a nasal spray as part of her treatment regimen. She completed a course of Z-Pak today, which was prescribed for suspected pneumonia. She recalls using Tessalon Perles in the past, around 2019, but is unsure about previous use of codeine-containing cough syrups.  She has a history of elevated blood sugar levels, which she attributes to her current condition. She is concerned about the potential impact of prednisone on her blood sugar levels.  No shortness of breath or fever. She feels weak, which she attributes to the persistent coughing.           04/06/2023    9:52 AM 01/26/2023    9:12 AM 10/04/2022    8:45 AM  Depression screen PHQ 2/9  Decreased Interest 0 0 0  Down, Depressed, Hopeless 0 0 0  PHQ - 2 Score 0 0 0  Altered sleeping 3 0 0  Tired, decreased energy 0 0 0  Change in appetite 0 0 0  Feeling bad or failure about yourself  0 0 0  Trouble concentrating 0 0 0  Moving slowly or fidgety/restless 0 0 0  Suicidal thoughts 0 0 0  PHQ-9 Score 3 0 0  Difficult doing work/chores  Not difficult at all Not difficult at all       04/06/2023    9:52 AM 10/04/2022    8:45  AM 04/04/2022    8:38 AM  GAD 7 : Generalized Anxiety Score  Nervous, Anxious, on Edge 0 0 0  Control/stop worrying 0 0 0  Worry too much - different things 0 0 0  Trouble relaxing 0 0 0  Restless 0 0 0  Easily annoyed or irritable 0 0 0  Afraid - awful might happen 0 0 0  Total GAD 7 Score 0 0 0  Anxiety Difficulty  Not difficult at all Not difficult at all    Social History   Tobacco Use   Smoking status: Never   Smokeless tobacco: Never  Vaping Use   Vaping status: Never Used  Substance Use Topics   Alcohol use: No    Alcohol/week: 0.0 standard drinks of alcohol   Drug use: No    Review of Systems Per HPI unless specifically indicated above     Objective:    BP 120/78   Pulse 95   Ht 5\' 1"  (1.549 m)   SpO2 99%   BMI 16.63 kg/m   Wt Readings from Last 3 Encounters:  06/04/23 88 lb (39.9 kg)  04/06/23 87 lb (39.5 kg)  01/26/23 86 lb 9.6 oz (39.3 kg)    Physical Exam Vitals and nursing note reviewed.  Constitutional:      General: She is not in acute distress.    Appearance: She is well-developed. She is not diaphoretic.     Comments: Well-appearing, comfortable, cooperative  HENT:     Head: Normocephalic and atraumatic.  Eyes:     General:        Right eye: No discharge.        Left eye: No discharge.     Conjunctiva/sclera: Conjunctivae normal.  Neck:     Thyroid: No thyromegaly.  Cardiovascular:     Rate and Rhythm: Normal rate and regular rhythm.     Heart sounds: Normal heart sounds. No murmur heard. Pulmonary:     Effort: Pulmonary effort is normal. No respiratory distress.     Breath sounds: Normal breath sounds. No wheezing or rales.  Musculoskeletal:        General: Normal range of motion.     Cervical back: Normal range of motion and neck supple.  Lymphadenopathy:     Cervical: No cervical adenopathy.  Skin:    General: Skin is warm and dry.     Findings: No erythema or rash.  Neurological:     Mental Status: She is alert and  oriented to person, place, and time.  Psychiatric:        Behavior: Behavior normal.     Comments: Well groomed, good eye contact, normal speech and thoughts     Results for orders placed or performed in visit on 03/30/23  T4, free   Collection Time: 03/30/23  8:17 AM  Result Value Ref Range   Free T4 1.2 0.8 - 1.8 ng/dL  TSH   Collection Time: 03/30/23  8:17 AM  Result Value Ref Range   TSH 1.95 0.40 - 4.50 mIU/L  Hemoglobin A1c   Collection Time: 03/30/23  8:17 AM  Result Value Ref Range   Hgb A1c MFr Bld 8.7 (H) <5.7 % of total Hgb   Mean Plasma Glucose 203 mg/dL   eAG (mmol/L) 81.8 mmol/L  Lipid panel   Collection Time: 03/30/23  8:17 AM  Result Value Ref Range   Cholesterol 138 <200 mg/dL   HDL 71 > OR = 50 mg/dL   Triglycerides 61 <299 mg/dL   LDL Cholesterol (Calc) 53 mg/dL (calc)   Total CHOL/HDL Ratio 1.9 <5.0 (calc)   Non-HDL Cholesterol (Calc) 67 <371 mg/dL (calc)  COMPLETE METABOLIC PANEL WITH GFR   Collection Time: 03/30/23  8:17 AM  Result Value Ref Range   Glucose, Bld 168 (H) 65 - 99 mg/dL   BUN 14 7 - 25 mg/dL   Creat 6.96 (L) 7.89 - 0.95 mg/dL   eGFR 89 > OR = 60 FY/BOF/7.51W2   BUN/Creatinine Ratio 25 (H) 6 - 22 (calc)   Sodium 132 (L) 135 - 146 mmol/L   Potassium 5.1 3.5 - 5.3 mmol/L   Chloride 95 (L) 98 - 110 mmol/L   CO2 29 20 - 32 mmol/L   Calcium 10.1 8.6 - 10.4 mg/dL   Total Protein 7.4 6.1 - 8.1 g/dL   Albumin 4.5 3.6 - 5.1 g/dL   Globulin 2.9 1.9 - 3.7 g/dL (calc)   AG Ratio 1.6 1.0 - 2.5 (calc)   Total Bilirubin 0.5 0.2 - 1.2 mg/dL   Alkaline phosphatase (APISO) 99 37 - 153 U/L   AST 22 10 - 35 U/L   ALT 24 6 - 29 U/L  CBC with Differential/Platelet   Collection Time: 03/30/23  8:17 AM  Result Value Ref  Range   WBC 4.5 3.8 - 10.8 Thousand/uL   RBC 4.62 3.80 - 5.10 Million/uL   Hemoglobin 14.3 11.7 - 15.5 g/dL   HCT 16.1 09.6 - 04.5 %   MCV 93.5 80.0 - 100.0 fL   MCH 31.0 27.0 - 33.0 pg   MCHC 33.1 32.0 - 36.0 g/dL   RDW 40.9  81.1 - 91.4 %   Platelets 292 140 - 400 Thousand/uL   MPV 10.1 7.5 - 12.5 fL   Neutro Abs 2,394 1,500 - 7,800 cells/uL   Absolute Lymphocytes 1,638 850 - 3,900 cells/uL   Absolute Monocytes 387 200 - 950 cells/uL   Eosinophils Absolute 50 15 - 500 cells/uL   Basophils Absolute 32 0 - 200 cells/uL   Neutrophils Relative % 53.2 %   Total Lymphocyte 36.4 %   Monocytes Relative 8.6 %   Eosinophils Relative 1.1 %   Basophils Relative 0.7 %      Assessment & Plan:   Problem List Items Addressed This Visit   None Visit Diagnoses       Acute non-recurrent frontal sinusitis    -  Primary   Relevant Medications   predniSONE (DELTASONE) 10 MG tablet   guaiFENesin-codeine (VIRTUSSIN A/C) 100-10 MG/5ML syrup     Bronchospasm, acute       Relevant Medications   predniSONE (DELTASONE) 10 MG tablet        Persistent Cough Deep, non-productive cough with some wheezing noted on examination. No fever or shortness of breath. Recently completed a course of Z-Pak. Patient has been using Mucinex and nasal spray with limited improvement. -Start prednisone taper over 5 days to open up airways and facilitate clearance of congestion. Rx Cough syrup w codeine to help with sleep -Consider chest x-ray next week if symptoms persist or worsen.  Hyperglycemia Patient has a history of elevated blood sugars. -Monitor blood sugar levels while on prednisone due to potential for temporary hyperglycemia.  Follow-up Patient to call office if symptoms persist or worsen, or if there are any concerns about medication side effects.         No orders of the defined types were placed in this encounter.   Meds ordered this encounter  Medications   predniSONE (DELTASONE) 10 MG tablet    Sig: Take 5 tabs with breakfast Day 1, 4 tabs Day 2, 3 tabs Day 3, 2 tabs Day 4, 1 tabs Day 5    Dispense:  15 tablet    Refill:  0   guaiFENesin-codeine (VIRTUSSIN A/C) 100-10 MG/5ML syrup    Sig: Take 5 mLs by mouth 3  (three) times daily as needed.    Dispense:  120 mL    Refill:  0    Follow up plan: Return if symptoms worsen or fail to improve.   Saralyn Pilar, DO [facility] 06/08/2023, 2:43 PM

## 2023-06-21 ENCOUNTER — Other Ambulatory Visit: Payer: Self-pay | Admitting: Family Medicine

## 2023-06-21 DIAGNOSIS — E1169 Type 2 diabetes mellitus with other specified complication: Secondary | ICD-10-CM

## 2023-06-21 NOTE — Telephone Encounter (Signed)
 Copied from CRM [id number]. Topic: Clinical - Medication Refill >> Jun 21, 2023  1:38 PM Alessandra Bevels wrote: Most Recent Primary Care Visit:  Provider: Smitty Cords  Department: [facility]  Visit Type: PHYSICAL 20  Date: 04/06/2023  Medication: valsartan (DIOVAN) 40 MG tablet [[id number]]  Has the patient contacted their pharmacy? Yes (Agent: If no, request that the patient contact the pharmacy for the refill. If patient does not wish to contact the pharmacy document the reason why and proceed with request.) (Agent: If yes, when and what did the pharmacy advise?)  Is this the correct pharmacy for this prescription? Yes If no, delete pharmacy and type the correct one.  This is the patient's preferred pharmacy:    [facility] [place], [place] - [address] [facility] AT Portal to Registered [facility]mark Sites [address] [place] [address] Phone: [phone] Fax: [phone]   Has the prescription been filled recently? Yes  Is the patient out of the medication? Yes  Has the patient been seen for an appointment in the last year OR does the patient have an upcoming appointment? Yes  Can we respond through MyChart? Yes  Agent: Please be advised that Rx refills may take up to 3 business days. We ask that you follow-up with your pharmacy.

## 2023-06-22 NOTE — Telephone Encounter (Signed)
 [facility]/3 RF  Requested Prescriptions  Pending Prescriptions Disp Refills   valsartan (DIOVAN) 40 MG tablet 135 tablet 3    Sig: Take 1.5 tablets (60 mg total) by mouth daily.     Cardiovascular:  Angiotensin Receptor Blockers Failed - 06/22/2023  3:08 PM      Failed - Cr in normal range and within 180 days    Creat  Date Value Ref Range Status  03/30/2023 0.57 (L) 0.60 - 0.95 mg/dL Final   Creatinine, Urine  Date Value Ref Range Status  10/04/2022 40 20 - 275 mg/dL Final         Passed - K in normal range and within 180 days    Potassium  Date Value Ref Range Status  03/30/2023 5.1 3.5 - 5.3 mmol/L Final         Passed - Patient is not pregnant      Passed - Last BP in normal range    BP Readings from Last 1 Encounters:  06/08/23 120/78         Passed - Valid encounter within last 6 months    Recent Outpatient Visits           2 months ago Annual physical exam   [place] [facility], Netta Neat, DO   8 months ago Type 2 diabetes mellitus with other specified complication, without long-term current use of insulin [facility])   [place] [facility] Althea Charon, Netta Neat, DO   1 year ago Annual physical exam   [place] [facility] Smitty Cords, DO   1 year ago Type 2 diabetes mellitus with other specified complication, without long-term current use of insulin [facility])   [place] [facility] Althea Charon, Netta Neat, DO   2 years ago Annual physical exam   [place] [facility] Smitty Cords, DO       Future Appointments             In 1 week Deirdre Evener, MD [facility]   In 3 months Althea Charon, Netta Neat, DO [place] [facility]

## 2023-07-02 ENCOUNTER — Other Ambulatory Visit: Payer: Self-pay

## 2023-07-02 DIAGNOSIS — E119 Type 2 diabetes mellitus without complications: Secondary | ICD-10-CM

## 2023-07-02 MED ORDER — ONETOUCH VERIO VI STRP: ORAL_STRIP | 0 refills | Status: DC

## 2023-07-05 ENCOUNTER — Ambulatory Visit: Payer: Medicare HMO | Admitting: Dermatology

## 2023-07-05 ENCOUNTER — Encounter: Payer: Self-pay | Admitting: Dermatology

## 2023-07-05 DIAGNOSIS — Z1283 Encounter for screening for malignant neoplasm of skin: Secondary | ICD-10-CM | POA: Diagnosis not present

## 2023-07-05 DIAGNOSIS — D1801 Hemangioma of skin and subcutaneous tissue: Secondary | ICD-10-CM | POA: Diagnosis not present

## 2023-07-05 DIAGNOSIS — L814 Other melanin hyperpigmentation: Secondary | ICD-10-CM | POA: Diagnosis not present

## 2023-07-05 DIAGNOSIS — L578 Other skin changes due to chronic exposure to nonionizing radiation: Secondary | ICD-10-CM

## 2023-07-05 DIAGNOSIS — Z86018 Personal history of other benign neoplasm: Secondary | ICD-10-CM

## 2023-07-05 DIAGNOSIS — L820 Inflamed seborrheic keratosis: Secondary | ICD-10-CM

## 2023-07-05 DIAGNOSIS — B353 Tinea pedis: Secondary | ICD-10-CM

## 2023-07-05 DIAGNOSIS — Z85828 Personal history of other malignant neoplasm of skin: Secondary | ICD-10-CM

## 2023-07-05 DIAGNOSIS — W908XXA Exposure to other nonionizing radiation, initial encounter: Secondary | ICD-10-CM

## 2023-07-05 DIAGNOSIS — B351 Tinea unguium: Secondary | ICD-10-CM | POA: Diagnosis not present

## 2023-07-05 DIAGNOSIS — L821 Other seborrheic keratosis: Secondary | ICD-10-CM | POA: Diagnosis not present

## 2023-07-05 DIAGNOSIS — D692 Other nonthrombocytopenic purpura: Secondary | ICD-10-CM

## 2023-07-05 DIAGNOSIS — Z8589 Personal history of malignant neoplasm of other organs and systems: Secondary | ICD-10-CM

## 2023-07-05 DIAGNOSIS — L570 Actinic keratosis: Secondary | ICD-10-CM | POA: Diagnosis not present

## 2023-07-05 DIAGNOSIS — Z7189 Other specified counseling: Secondary | ICD-10-CM

## 2023-07-05 DIAGNOSIS — Z79899 Other long term (current) drug therapy: Secondary | ICD-10-CM

## 2023-07-05 NOTE — Progress Notes (Signed)
 Follow-Up Visit   Subjective  Barbara Santos is a 88 y.o. female who presents for the following: Skin Cancer Screening and Full Body Skin Exam The patient presents for Total-Body Skin Exam (TBSE) for skin cancer screening and mole check. The patient has spots, moles and lesions to be evaluated, some may be new or changing and the patient may have concern these could be cancer.  Hx SCC, BCC, DN, AK. Patient advises she does have some roughness at face. Patient uses ketoconazole cr at feet at bedtime for tinea.   The following portions of the chart were reviewed this encounter and updated as appropriate: medications, allergies, medical history  Review of Systems:  No other skin or systemic complaints except as noted in HPI or Assessment and Plan.  Objective  Well appearing patient in no apparent distress; mood and affect are within normal limits.  A full examination was performed including scalp, head, eyes, ears, nose, lips, neck, chest, axillae, abdomen, back, buttocks, bilateral upper extremities, bilateral lower extremities, hands, feet, fingers, toes, fingernails, and toenails. All findings within normal limits unless otherwise noted below.   Relevant physical exam findings are noted in the Assessment and Plan.  Right Forearm Erythematous stuck-on, waxy papule or plaque face x 12 (12) Erythematous thin papules/macules with gritty scale.   Assessment & Plan   SKIN CANCER SCREENING PERFORMED TODAY.  ACTINIC DAMAGE - Chronic condition, secondary to cumulative UV/sun exposure - diffuse scaly erythematous macules with underlying dyspigmentation - Recommend daily broad spectrum sunscreen SPF 30+ to sun-exposed areas, reapply every 2 hours as needed.  - Staying in the shade or wearing long sleeves, sun glasses (UVA+UVB protection) and wide brim hats (4-inch brim around the entire circumference of the hat) are also recommended for sun protection.  - Call for new or changing  lesions.  LENTIGINES, SEBORRHEIC KERATOSES, HEMANGIOMAS - Benign normal skin lesions - Benign-appearing - Call for any changes  MELANOCYTIC NEVI - Tan-brown and/or pink-flesh-colored symmetric macules and papules - Benign appearing on exam today - Observation - Call clinic for new or changing moles - Recommend daily use of broad spectrum spf 30+ sunscreen to sun-exposed areas.   History of Squamous Cell Carcinoma of the Skin - No evidence of recurrence today - No lymphadenopathy - Recommend regular full body skin exams - Recommend daily broad spectrum sunscreen SPF 30+ to sun-exposed areas, reapply every 2 hours as needed.  - Call if any new or changing lesions are noted between office visits   History of Basal Cell Carcinoma of the Skin - No evidence of recurrence today - Recommend regular full body skin exams - Recommend daily broad spectrum sunscreen SPF 30+ to sun-exposed areas, reapply every 2 hours as needed.  - Call if any new or changing lesions are noted between office visits   History of Dysplastic Nevi - No evidence of recurrence today - Recommend regular full body skin exams - Recommend daily broad spectrum sunscreen SPF 30+ to sun-exposed areas, reapply every 2 hours as needed.  - Call if any new or changing lesions are noted between office visits  Purpura - Chronic; persistent and recurrent.  Treatable, but not curable. - Violaceous macules and patches - Benign - Related to trauma, age, sun damage and/or use of blood thinners, chronic use of topical and/or oral steroids - Observe - Can use OTC arnica containing moisturizer such as Dermend Bruise Formula if desired - Call for worsening or other concerns  TINEA PEDIS/UNGUIUM Exam: Scaling and maceration  web spaces and over distal and lateral soles. Chronic and persistent condition with duration or expected duration over one year. Condition is symptomatic / bothersome to patient. Not to goal. Treatment Plan:   ketoconazole 2% cr 1-2 times daily to feet and in between toes  INFLAMED SEBORRHEIC KERATOSIS Right Forearm Symptomatic, irritating, patient would like treated.  Benign-appearing.  Call clinic for new or changing lesions. \  Destruction of lesion - Right Forearm Complexity: simple   Destruction method: cryotherapy   Informed consent: discussed and consent obtained   Timeout:  patient name, date of birth, surgical site, and procedure verified Lesion destroyed using liquid nitrogen: Yes   Region frozen until ice ball extended beyond lesion: Yes   Outcome: patient tolerated procedure well with no complications   Post-procedure details: wound care instructions given   AK (ACTINIC KERATOSIS) (12) face x 12 (12) Actinic keratoses are precancerous spots that appear secondary to cumulative UV radiation exposure/sun exposure over time. They are chronic with expected duration over 1 year. A portion of actinic keratoses will progress to squamous cell carcinoma of the skin. It is not possible to reliably predict which spots will progress to skin cancer and so treatment is recommended to prevent development of skin cancer.  Recommend daily broad spectrum sunscreen SPF 30+ to sun-exposed areas, reapply every 2 hours as needed.  Recommend staying in the shade or wearing long sleeves, sun glasses (UVA+UVB protection) and wide brim hats (4-inch brim around the entire circumference of the hat). Call for new or changing lesions. Destruction of lesion - face x 12 (12) Complexity: simple   Destruction method: cryotherapy   Informed consent: discussed and consent obtained   Timeout:  patient name, date of birth, surgical site, and procedure verified Lesion destroyed using liquid nitrogen: Yes   Region frozen until ice ball extended beyond lesion: Yes   Outcome: patient tolerated procedure well with no complications   Post-procedure details: wound care instructions given   ACTINIC SKIN DAMAGE   HISTORY  OF SQUAMOUS CELL CARCINOMA   HISTORY OF BASAL CELL CARCINOMA   HISTORY OF DYSPLASTIC NEVUS   PURPURA (HCC)   SKIN CANCER SCREENING   TINEA PEDIS OF BOTH FEET   Related Medications ketoconazole (NIZORAL) 2 % cream Apply 1 Application topically at bedtime. Apply to feet TINEA UNGUIUM   COUNSELING AND COORDINATION OF CARE   MEDICATION MANAGEMENT   Return in about 1 year (around 07/04/2024) for TBSE, with Dr. Kirtland Bouchard, HxSCC, HxDN, HxBCC, HxAK.  Anise Salvo, RMA, am acting as scribe for Armida Sans, MD .   Documentation: I have reviewed the above documentation for accuracy and completeness, and I agree with the above.  Armida Sans, MD

## 2023-07-05 NOTE — Patient Instructions (Signed)

## 2023-07-14 ENCOUNTER — Other Ambulatory Visit: Payer: Self-pay | Admitting: Family Medicine

## 2023-07-14 DIAGNOSIS — E119 Type 2 diabetes mellitus without complications: Secondary | ICD-10-CM

## 2023-07-16 NOTE — Telephone Encounter (Signed)
 Duplicate request, last refill 07/02/23.  Requested Prescriptions  Pending Prescriptions Disp Refills   ONETOUCH VERIO test strip [Pharmacy Med Name: ONE TOUCH VERIO TEST STRIP] 100 strip 0    Sig: USE TO CHECK BLOOD SUGAR ONCE A DAY. DX E11.9     Endocrinology: Diabetes - Testing Supplies Passed - 07/16/2023  2:00 PM      Passed - Valid encounter within last 12 months    Recent Outpatient Visits           3 months ago Annual physical exam   [place] [facility] [place], Netta Neat, DO   9 months ago Type 2 diabetes mellitus with other specified complication, without long-term current use of insulin [facility])   [place] [facility] Althea Charon, Netta Neat, DO   1 year ago Annual physical exam   [place] [facility] Smitty Cords, DO   1 year ago Type 2 diabetes mellitus with other specified complication, without long-term current use of insulin [facility])   [place] [facility] Smitty Cords, DO   2 years ago Annual physical exam   [place] [facility] Smitty Cords, DO       Future Appointments             In 2 months Althea Charon, Netta Neat, DO [facility], [place]   In 11 months Deirdre Evener, MD [facility]

## 2023-08-03 ENCOUNTER — Other Ambulatory Visit: Payer: Self-pay | Admitting: Family Medicine

## 2023-08-03 DIAGNOSIS — E039 Hypothyroidism, unspecified: Secondary | ICD-10-CM

## 2023-08-03 NOTE — Telephone Encounter (Signed)
 Requested Prescriptions  Pending Prescriptions Disp Refills   levothyroxine (SYNTHROID) 50 MCG tablet [Pharmacy Med Name: LEVOTHYROXIN TAB 50MCG] 90 tablet 0    Sig: TAKE 1 TABLET DAILY BEFORE BREAKFAST     Endocrinology:  Hypothyroid Agents Passed - 08/03/2023  1:57 PM      Passed - TSH in normal range and within 360 days    TSH  Date Value Ref Range Status  03/30/2023 1.95 0.40 - 4.50 mIU/L Final         Passed - Valid encounter within last 12 months    Recent Outpatient Visits           1 month ago Acute non-recurrent frontal sinusitis   [place] [facility] [place], Netta Neat, DO   2 months ago Acute non-recurrent frontal sinusitis   [place] [facility] Althea Charon, Netta Neat, DO       Future Appointments             In 2 months Althea Charon, Netta Neat, DO [place] [facility] [place]   In 11 months Deirdre Evener, MD [facility]

## 2023-08-23 ENCOUNTER — Other Ambulatory Visit: Payer: Self-pay | Admitting: Family Medicine

## 2023-08-23 DIAGNOSIS — E1169 Type 2 diabetes mellitus with other specified complication: Secondary | ICD-10-CM

## 2023-08-26 NOTE — Telephone Encounter (Signed)
 Rx 11/16/22 #135 3RF- 1 year Rx- too soon Requested Prescriptions  Pending Prescriptions Disp Refills   valsartan  (DIOVAN ) 40 MG tablet [Pharmacy Med Name: VALSARTAN  TAB 40MG ] 90 tablet 3    Sig: TAKE 1 TABLET DAILY.     Cardiovascular:  Angiotensin Receptor Blockers Failed - 08/26/2023 11:57 AM      Failed - Cr in normal range and within 180 days    Creat  Date Value Ref Range Status  03/30/2023 0.57 (L) 0.60 - 0.95 mg/dL Final   Creatinine, Urine  Date Value Ref Range Status  10/04/2022 40 20 - 275 mg/dL Final         Passed - K in normal range and within 180 days    Potassium  Date Value Ref Range Status  03/30/2023 5.1 3.5 - 5.3 mmol/L Final         Passed - Patient is not pregnant      Passed - Last BP in normal range    BP Readings from Last 1 Encounters:  06/08/23 120/78         Passed - Valid encounter within last 6 months    Recent Outpatient Visits           2 months ago Acute non-recurrent frontal sinusitis   [place] [facility] Raina Bunting, DO   2 months ago Acute non-recurrent frontal sinusitis   [place] [facility] [place], Kayleen Party, DO       Future Appointments             In 1 month [place], Kayleen Party, DO [place] [facility], [place]   In 10 months Elta Halter, MD [facility]

## 2023-08-27 ENCOUNTER — Other Ambulatory Visit: Payer: Self-pay | Admitting: Family Medicine

## 2023-08-27 DIAGNOSIS — E1169 Type 2 diabetes mellitus with other specified complication: Secondary | ICD-10-CM

## 2023-08-27 NOTE — Telephone Encounter (Signed)
 Copied from CRM [id number]. Topic: Clinical - Medication Refill >> Aug 27, 2023  9:32 AM Hassie Lint wrote: Most Recent Primary Care Visit:  Provider: Raina Bunting  Department: [facility]  Visit Type: OFFICE VISIT  Date: 06/08/2023  Medication: valsartan  (DIOVAN ) 40 MG tablet  Has the patient contacted their pharmacy? Yes - they advised they did not get approval from the Dr. (Agent: If no, request that the patient contact the pharmacy for the refill. If patient does not wish to contact the pharmacy document the reason why and proceed with request.) (Agent: If yes, when and what did the pharmacy advise?)  Is this the correct pharmacy for this prescription? Yes If no, delete pharmacy and type the correct one.  This is the patient's preferred pharmacy:   [facility] [place], [place] - [address] [facility] AT Portal to Registered Caremark Sites [address] [place] [address] Phone: [phone] Fax: [phone]  Has the prescription been filled recently? No  Is the patient out of the medication? No, will be out before the end of the week.  Has the patient been seen for an appointment in the last year OR does the patient have an upcoming appointment? Yes  Can we respond through MyChart? No Can be reached at [phone]  Agent: Please be advised that Rx refills may take up to 3 business days. We ask that you follow-up with your pharmacy.

## 2023-08-29 MED ORDER — VALSARTAN 40 MG PO TABS: 60.0000 mg | ORAL_TABLET | Freq: Every day | ORAL | 0 refills | Status: DC

## 2023-08-29 NOTE — Telephone Encounter (Signed)
 Change of pharmacy  Requested Prescriptions  Pending Prescriptions Disp Refills   valsartan  (DIOVAN ) 40 MG tablet 135 tablet 0    Sig: Take 1.5 tablets (60 mg total) by mouth daily.     Cardiovascular:  Angiotensin Receptor Blockers Failed - 08/29/2023  8:03 AM      Failed - Cr in normal range and within 180 days    Creat  Date Value Ref Range Status  03/30/2023 0.57 (L) 0.60 - 0.95 mg/dL Final   Creatinine, Urine  Date Value Ref Range Status  10/04/2022 40 20 - 275 mg/dL Final         Passed - K in normal range and within 180 days    Potassium  Date Value Ref Range Status  03/30/2023 5.1 3.5 - 5.3 mmol/L Final         Passed - Patient is not pregnant      Passed - Last BP in normal range    BP Readings from Last 1 Encounters:  06/08/23 120/78         Passed - Valid encounter within last 6 months    Recent Outpatient Visits           2 months ago Acute non-recurrent frontal sinusitis   [place] [facility] Raina Bunting, DO   2 months ago Acute non-recurrent frontal sinusitis   [place] [facility] [place], Kayleen Party, DO       Future Appointments             In 1 month Romeo Co, Kayleen Party, DO  [facility], [place]   In 10 months Elta Halter, MD [facility]

## 2023-09-11 DIAGNOSIS — H401122 Primary open-angle glaucoma, left eye, moderate stage: Secondary | ICD-10-CM | POA: Diagnosis not present

## 2023-09-20 DIAGNOSIS — H40001 Preglaucoma, unspecified, right eye: Secondary | ICD-10-CM | POA: Diagnosis not present

## 2023-09-20 DIAGNOSIS — H43822 Vitreomacular adhesion, left eye: Secondary | ICD-10-CM | POA: Diagnosis not present

## 2023-09-20 DIAGNOSIS — H401122 Primary open-angle glaucoma, left eye, moderate stage: Secondary | ICD-10-CM | POA: Diagnosis not present

## 2023-09-20 DIAGNOSIS — E119 Type 2 diabetes mellitus without complications: Secondary | ICD-10-CM | POA: Diagnosis not present

## 2023-09-20 LAB — HM DIABETES EYE EXAM

## 2023-09-21 ENCOUNTER — Other Ambulatory Visit: Payer: Self-pay | Admitting: Dermatology

## 2023-09-21 ENCOUNTER — Other Ambulatory Visit: Payer: Self-pay | Admitting: Family Medicine

## 2023-09-21 DIAGNOSIS — E119 Type 2 diabetes mellitus without complications: Secondary | ICD-10-CM

## 2023-09-21 DIAGNOSIS — B353 Tinea pedis: Secondary | ICD-10-CM

## 2023-09-22 NOTE — Telephone Encounter (Signed)
 Requested Prescriptions  Pending Prescriptions Disp Refills   glucose blood (ONETOUCH VERIO) test strip [Pharmacy Med Name: ONE TOUCH VERIO TEST STRIP] 100 strip 1    Sig: USE TO CHECK BLOOD SUGAR ONCE A DAY. DX E11.9     Endocrinology: Diabetes - Testing Supplies Passed - 09/22/2023  5:16 PM      Passed - Valid encounter within last 12 months    Recent Outpatient Visits           3 months ago Acute non-recurrent frontal sinusitis   [facility] [place], Kayleen Party, DO   3 months ago Acute non-recurrent frontal sinusitis   [facility] [place], Kayleen Party, DO       Future Appointments             In 2 weeks Romeo Co, Kayleen Party, DO [facility], [place]   In 9 months Elta Halter, MD [facility] [place] [facility]

## 2023-09-25 ENCOUNTER — Encounter: Payer: Self-pay | Admitting: Family Medicine

## 2023-10-09 ENCOUNTER — Ambulatory Visit (INDEPENDENT_AMBULATORY_CARE_PROVIDER_SITE_OTHER): Payer: Self-pay | Admitting: Family Medicine

## 2023-10-09 ENCOUNTER — Encounter: Payer: Self-pay | Admitting: Family Medicine

## 2023-10-09 ENCOUNTER — Other Ambulatory Visit: Payer: Self-pay | Admitting: Family Medicine

## 2023-10-09 VITALS — BP 138/84 | HR 80 | Ht 61.0 in | Wt 83.4 lb

## 2023-10-09 DIAGNOSIS — I10 Essential (primary) hypertension: Secondary | ICD-10-CM

## 2023-10-09 DIAGNOSIS — E7800 Pure hypercholesterolemia, unspecified: Secondary | ICD-10-CM

## 2023-10-09 DIAGNOSIS — E039 Hypothyroidism, unspecified: Secondary | ICD-10-CM | POA: Diagnosis not present

## 2023-10-09 DIAGNOSIS — E119 Type 2 diabetes mellitus without complications: Secondary | ICD-10-CM

## 2023-10-09 DIAGNOSIS — Z7984 Long term (current) use of oral hypoglycemic drugs: Secondary | ICD-10-CM | POA: Diagnosis not present

## 2023-10-09 DIAGNOSIS — E440 Moderate protein-calorie malnutrition: Secondary | ICD-10-CM

## 2023-10-09 DIAGNOSIS — E1169 Type 2 diabetes mellitus with other specified complication: Secondary | ICD-10-CM

## 2023-10-09 DIAGNOSIS — Z0000 Encounter for general adult medical examination without abnormal findings: Secondary | ICD-10-CM

## 2023-10-09 LAB — POCT GLYCOSYLATED HEMOGLOBIN (HGB A1C): Hemoglobin A1C: 7.3 % — AB (ref 4.0–5.6)

## 2023-10-09 MED ORDER — VALSARTAN 40 MG PO TABS: 60.0000 mg | ORAL_TABLET | Freq: Every day | ORAL | 3 refills | Status: AC

## 2023-10-09 MED ORDER — ROSUVASTATIN CALCIUM 5 MG PO TABS: 5.0000 mg | ORAL_TABLET | Freq: Every day | ORAL | 3 refills | Status: AC

## 2023-10-09 MED ORDER — LEVOTHYROXINE SODIUM 50 MCG PO TABS: 50.0000 ug | ORAL_TABLET | Freq: Every day | ORAL | 3 refills | Status: AC

## 2023-10-09 NOTE — Progress Notes (Signed)
 Subjective:    Patient ID: Barbara Santos, female    DOB: [birth year], 88 y.o.   MRN: [id number]  Barbara Santos is a 88 y.o. female presenting on 10/09/2023 for Diabetes   HPI  Discussed the use of AI scribe software for clinical note transcription with the patient, who gave verbal consent to proceed.  History of Present Illness   Barbara Santos is an 88 year old female with diabetes who presents for a routine follow-up visit.   She experiences urgency and occasional incontinence at night, particularly during the first couple of times she gets up to use the bathroom.  She is concerned about her balance and inquires about exercises to improve it, noting that she tries to be careful while walking to avoid falls.      CHRONIC DM, Type 2: BMI >15 moderate protein calorie malnutrition Last A1c 7.3 She has been experiencing fluctuations in her blood sugar levels, with some readings as high as 311 mg/dL after consuming high-carbohydrate meals CBGs: Elevated,  OneTouch Verio Meds: Metformin  XR 500mg  x 1 in AM and x 2 in PM Currently on ARB Lifestyle: - Diet - often eating baked chicken and higher protein supplement drinks. 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 Current Meds - Valsartan  60mg  daily - (takes 1.5 tab of 40mg )   Reports good compliance, took meds today. Tolerating well, w/o complaints. Lifestyle: - Diet: Limited sodium diet now, avoid salt added. Limited meats Admits mild HA in AM self limited Denies CP, dyspnea, edema, dizziness / lightheadedness   HYPERLIPIDEMIA: - Reports no concerns. Last lipid panel 03/2023, controlled  - Currently taking Rosuvastatin  5mg , tolerating well without side effects or myalgias   Hypothyroidism Controlled on labs, Continues  Levothyroxine  dose 50mcg daily.            10/09/2023   10:29 AM 04/06/2023    9:52 AM 01/26/2023    9:12 AM  Depression screen PHQ 2/9  Decreased Interest 0 0 0  Down, Depressed, Hopeless 0 0 0  PHQ - 2 Score 0 0 0  Altered sleeping 0 3 0  Tired, decreased energy 0 0 0  Change in appetite 0 0 0  Feeling bad or failure about yourself  0 0 0  Trouble concentrating 0 0 0  Moving slowly or fidgety/restless 0 0 0  Suicidal thoughts 0 0 0  PHQ-9 Score 0 3 0  Difficult doing work/chores Not difficult at all  Not difficult at all       10/09/2023   10:29 AM 04/06/2023    9:52 AM 10/04/2022    8:45 AM 04/04/2022    8:38 AM  GAD 7 : Generalized Anxiety Score  Nervous, Anxious, on Edge 0 0 0 0  Control/stop worrying 0 0 0 0  Worry too much - different things 0 0 0 0  Trouble relaxing 0 0 0 0  Restless 0 0 0 0  Easily annoyed or irritable 0 0 0 0  Afraid - awful might happen 0 0 0 0  Total GAD 7 Score 0 0 0 0  Anxiety Difficulty Not difficult at all  Not difficult at all Not difficult at all    Social History   Tobacco Use   Smoking status: Never   Smokeless tobacco: Never  Vaping Use   Vaping status: Never Used  Substance Use Topics   Alcohol use: No    Alcohol/week: 0.0 standard drinks of alcohol   Drug use: No    Review of Systems Per HPI unless specifically indicated above     Objective:    BP 138/84 (BP Location: Left Arm, Cuff Size: Normal)   Pulse 80   Ht 5' 1 (1.549 m)   Wt 83 lb 6 oz (37.8 kg)   SpO2 100%   BMI 15.75 kg/m   Wt Readings from Last 3 Encounters:  10/09/23 83 lb 6 oz (37.8 kg)  06/04/23 88 lb (39.9 kg)  04/06/23 87 lb (39.5 kg)    Physical Exam Vitals and nursing note reviewed.  Constitutional:      General: She is not in acute distress.    Appearance: She is well-developed. She is not diaphoretic.     Comments: Well-appearing, thin appearance, comfortable, cooperative  HENT:     Head: Normocephalic and atraumatic.   Eyes:      General:        Right eye: No discharge.        Left eye: No discharge.     Conjunctiva/sclera: Conjunctivae normal.   Neck:     Thyroid : No thyromegaly.   Cardiovascular:     Rate and Rhythm: Normal rate and regular rhythm.     Heart sounds: Normal heart sounds. No murmur heard. Pulmonary:     Effort: Pulmonary effort is normal. No respiratory distress.     Breath sounds: Normal breath sounds. No wheezing or rales.   Musculoskeletal:        General: Normal range of motion.     Cervical back: Normal range of motion and neck supple.  Lymphadenopathy:     Cervical: No cervical adenopathy.   Skin:    General: Skin is warm and dry.     Findings: No erythema or rash.   Neurological:     Mental Status: She is alert and oriented to person, place, and time.   Psychiatric:        Behavior: Behavior normal.     Comments: Well groomed, good eye contact, normal speech and thoughts     Recent Labs    03/30/23 0817 10/09/23 0951  HGBA1C 8.7* 7.3*    Results for orders placed or performed in visit on 10/09/23  POCT HgB A1C   Collection Time: 10/09/23  9:51 AM  Result Value Ref Range   Hemoglobin A1C 7.3 (A) 4.0 - 5.6 %   HbA1c POC (<> result, manual entry)     HbA1c, POC (prediabetic range)     HbA1c, POC (controlled diabetic range)        Assessment & Plan:   Problem List Items Addressed This Visit     Elevated LDL cholesterol level   Relevant Medications   rosuvastatin  (CRESTOR ) 5 MG tablet   Hypothyroidism   Relevant Medications   levothyroxine  (SYNTHROID ) 50 MCG tablet   Type 2 diabetes mellitus with other specified complication (HCC)   Relevant Medications   rosuvastatin  (CRESTOR ) 5 MG tablet   valsartan  (DIOVAN ) 40 MG tablet   Other Visit Diagnoses       Controlled type 2 diabetes mellitus without complication, without long-term current use of insulin (HCC)    -  Primary   Relevant Medications   rosuvastatin  (CRESTOR ) 5 MG tablet   valsartan  (DIOVAN ) 40  MG tablet   Other Relevant Orders   POCT HgB A1C (  Completed)     Long term current use of oral hypoglycemic drug           Type 2 Diabetes Mellitus HbA1c improved to 7.3, indicating better glycemic control. Discussed potential addition of Farxiga or Januvia for improved control. Prefers to continue metformin  due to satisfaction and side effect concerns. - Continue metformin  as currently prescribed. XR 500mg  1 in AM and 2 in PM - Check cost and coverage of Januvia, Farxiga, and Jardiance with pharmacy and insurance. - Consider adding Januvia if further glycemic control is needed.  Hypertension Blood pressure slightly elevated but within normal range. Current regimen maintained. - Continue current antihypertensive medication regimen. - Valsartan  60mg  (40mg  x 1.5 tab)  Hyperlipidemia Controlled on Rosuvastatin  5mg  nightly Refill  Hypothyroidism On thyroid  medication Levothyroxine  50mcg daily - Order refills for thyroid  medication through mail order.  Balance Issues Reports balance concerns, no history of injurious falls. Core strength exercises recommended. - Provide core strength exercises to perform at home. - Consider referral to a physical therapist for balance training if needed.        Orders Placed This Encounter  Procedures   POCT HgB A1C    Meds ordered this encounter  Medications   rosuvastatin  (CRESTOR ) 5 MG tablet    Sig: Take 1 tablet (5 mg total) by mouth at bedtime.    Dispense:  90 tablet    Refill:  3    Add future refills   valsartan  (DIOVAN ) 40 MG tablet    Sig: Take 1.5 tablets (60 mg total) by mouth daily.    Dispense:  135 tablet    Refill:  3    Add future refills   levothyroxine  (SYNTHROID ) 50 MCG tablet    Sig: Take 1 tablet (50 mcg total) by mouth daily before breakfast.    Dispense:  90 tablet    Refill:  3    Add future refills    Follow up plan: Return for 6 month fasting lab > 1 week later Annual Physical.  Future labs ordered for  04/04/24 add urine micro next time   Domingo Friend, DO [facility] 10/09/2023, 10:02 AM

## 2023-10-09 NOTE — Patient Instructions (Addendum)
 Thank you for coming to the office today.  Try to check the cost and coverage for the following  Januvia 100mg  daily (preferred) Tradjenta 5mg  daily  ------------  Elvina Hammers 10mg  daily Jardiance 25mg  daily  Call back if you want to pursue ordering one of these.  Recent Labs    03/30/23 0817 10/09/23 0951  HGBA1C 8.7* 7.3*   Keep on the metformin  currently  Try the Core Strength exercises at home, if need we can refer to a Balance Therapist.  DUE for FASTING BLOOD WORK (no food or drink after midnight before the lab appointment, only water or coffee without cream/sugar on the morning of)  SCHEDULE Lab Only visit in the morning at the clinic for lab draw in 6 MONTHS   - Make sure Lab Only appointment is at about 1 week before your next appointment, so that results will be available  For Lab Results, once available within 2-3 days of blood draw, you can can log in to MyChart online to view your results and a brief explanation. Also, we can discuss results at next follow-up visit.   Please schedule a Follow-up Appointment to: Return for 6 month fasting lab > 1 week later Annual Physical.  If you have any other questions or concerns, please feel free to call the office or send a message through MyChart. You may also schedule an earlier appointment if necessary.  Additionally, you may be receiving a survey about your experience at our office within a few days to 1 week by e-mail or mail. We value your feedback.  Domingo Friend, DO [facility], [place]

## 2023-10-16 ENCOUNTER — Telehealth: Payer: Self-pay

## 2023-10-16 NOTE — Telephone Encounter (Signed)
 Copied from CRM [id number]. Topic: Clinical - Medication Question >> Oct 16, 2023 11:27 AM Winona R wrote: Pt calling to ask Dr.K for assistance finding a diabetic medication that is not soo expensive as she has done some research on a few but can't afford it.

## 2023-10-17 NOTE — Telephone Encounter (Signed)
 Called patient, based on A1c and current medication with Metformin , I advised her that only other reasonable option is low dose daily basal insulin for sugar control, low cost and may promote weight gain. Given her low BMI weight. She will defer for now. Discuss at next visit consider sample basal insulin  Marsa Officer, DO [facility] 10/17/2023, 12:31 PM

## 2023-11-25 ENCOUNTER — Other Ambulatory Visit: Payer: Self-pay | Admitting: Family Medicine

## 2023-11-25 DIAGNOSIS — E1169 Type 2 diabetes mellitus with other specified complication: Secondary | ICD-10-CM

## 2023-11-27 NOTE — Telephone Encounter (Signed)
 Too soon for RF. LRF 10/09/23 for 90 and 1 RF.  Requested Prescriptions  Pending Prescriptions Disp Refills   valsartan  (DIOVAN ) 40 MG tablet [Pharmacy Med Name: VALSARTAN  40 MG TABLET] 135 tablet 3    Sig: TAKE 1.5 TABLETS BY MOUTH DAILY.     Cardiovascular:  Angiotensin Receptor Blockers Failed - 11/27/2023  9:04 AM      Failed - Cr in normal range and within 180 days    Creat  Date Value Ref Range Status  03/30/2023 0.57 (L) 0.60 - 0.95 mg/dL Final   Creatinine, Urine  Date Value Ref Range Status  10/04/2022 40 20 - 275 mg/dL Final         Failed - K in normal range and within 180 days    Potassium  Date Value Ref Range Status  03/30/2023 5.1 3.5 - 5.3 mmol/L Final         Passed - Patient is not pregnant      Passed - Last BP in normal range    BP Readings from Last 1 Encounters:  10/09/23 138/84         Passed - Valid encounter within last 6 months    Recent Outpatient Visits           1 month ago Controlled type 2 diabetes mellitus without complication, without long-term current use of insulin [facility])   [facility] [place] Marsa PARAS, DO   5 months ago Acute non-recurrent frontal sinusitis   [place] [facility] [place], Marsa PARAS, DO   5 months ago Acute non-recurrent frontal sinusitis   [place] [facility] [place] Marsa PARAS, DO       Future Appointments             In 7 months Hester Alm BROCKS, MD [facility]

## 2023-11-30 ENCOUNTER — Encounter: Payer: Self-pay | Admitting: Family Medicine

## 2023-11-30 ENCOUNTER — Other Ambulatory Visit: Payer: Self-pay | Admitting: Family Medicine

## 2023-11-30 DIAGNOSIS — Z1231 Encounter for screening mammogram for malignant neoplasm of breast: Secondary | ICD-10-CM

## 2023-12-18 ENCOUNTER — Ambulatory Visit
Admission: RE | Admit: 2023-12-18 | Discharge: 2023-12-18 | Disposition: A | Discharge status: Home / Self Care | Source: Ambulatory Visit | Attending: Family Medicine | Admitting: Family Medicine

## 2023-12-18 DIAGNOSIS — Z1231 Encounter for screening mammogram for malignant neoplasm of breast: Secondary | ICD-10-CM | POA: Diagnosis not present

## 2024-01-14 ENCOUNTER — Ambulatory Visit: Admitting: Family Medicine

## 2024-01-14 ENCOUNTER — Ambulatory Visit: Payer: Self-pay

## 2024-01-14 ENCOUNTER — Encounter: Payer: Self-pay | Admitting: Family Medicine

## 2024-01-14 VITALS — BP 118/68 | HR 74 | Ht 61.0 in | Wt 82.5 lb

## 2024-01-14 DIAGNOSIS — Z23 Encounter for immunization: Secondary | ICD-10-CM

## 2024-01-14 DIAGNOSIS — E1169 Type 2 diabetes mellitus with other specified complication: Secondary | ICD-10-CM

## 2024-01-14 DIAGNOSIS — Z7984 Long term (current) use of oral hypoglycemic drugs: Secondary | ICD-10-CM | POA: Diagnosis not present

## 2024-01-14 DIAGNOSIS — R739 Hyperglycemia, unspecified: Secondary | ICD-10-CM

## 2024-01-14 LAB — POCT GLYCOSYLATED HEMOGLOBIN (HGB A1C): Hemoglobin A1C: 7.7 % — AB (ref 4.0–5.6)

## 2024-01-14 MED ORDER — METFORMIN HCL ER 500 MG PO TB24: 1000.0000 mg | ORAL_TABLET | Freq: Two times a day (BID) | ORAL | 3 refills | Status: DC

## 2024-01-14 NOTE — Telephone Encounter (Signed)
 FYI Only or Action Required?: FYI only for provider.  Patient was last seen in primary care on 10/09/2023 by [place] Marsa PARAS, DO.  Called Nurse Triage reporting Hyperglycemia.  Symptoms began several weeks ago.  Interventions attempted: Prescription medications: metformin .  Symptoms are: unchanged.  Triage Disposition: See Physician Within 24 Hours (overriding Home Care)  Patient/caregiver understands and will follow disposition?: Yes   Copied from CRM [id number]. Topic: Clinical - Red Word Triage >> Jan 14, 2024 11:37 AM Treva T wrote: [facility] that prompted transfer to Nurse Triage: Received call from patient, reports her blood sugars are running high, 200 range, patient reports no present symptoms, but is concerned with high  sugar levels.   States she has been taking Metformin , but feels like that medication is not helping. Reason for Disposition  Blood glucose 70-240 mg/dL (3.9 -86.6 mmol/L)  Answer Assessment - Initial Assessment Questions Additional info:  For about 2 weeks her blood sugars have been running 170-200's. Asymptomatic. She is wondering if she needs a medication adjustment. Requesting appointment with pcp-scheduled   1. BLOOD GLUCOSE: What is your blood glucose level?      200  2. ONSET: When did you check the blood glucose?     This morning  3. USUAL RANGE: What is your glucose level usually? (e.g., usual fasting morning value, usual evening value)      4. KETONES: Do you check for ketones (urine or blood test strips)? If Yes, ask: What does the test show now?       5. TYPE 1 or 2:  Do you know what type of diabetes you have?  (e.g., Type 1, Type 2, Gestational; doesn't know)      2 6. INSULIN: Do you take insulin? What type of insulin(s) do you use? What is the mode of delivery? (syringe, pen; injection or pump)?       7. DIABETES PILLS: Do you take any pills for your diabetes? If Yes, ask: Have you missed taking any pills  recently?     Metformin -taking as prescribed, no missed doses 8. OTHER SYMPTOMS: Do you have any symptoms? (e.g., fever, frequent urination, difficulty breathing, dizziness, weakness, vomiting)     denies  Protocols used: Diabetes - High Blood Sugar-A-AH

## 2024-01-14 NOTE — Patient Instructions (Addendum)
 Thank you for coming to the office today.  Recent Labs    03/30/23 0817 10/09/23 0951 01/14/24 1500  HGBA1C 8.7* 7.3* 7.7*   Average around 170-180  Goal to increase Metformin  2 pills in AM and 2 pills in PM  [place] (pharmacist [facility]) will call you to schedule / setup time to talk on the medications prescriptions coverage.  Back up plan, Glipizide if we run out of options.  Please schedule a Follow-up Appointment to: Return if symptoms worsen or fail to improve.  If you have any other questions or concerns, please feel free to call the office or send a message through MyChart. You may also schedule an earlier appointment if necessary.  Additionally, you may be receiving a survey about your experience at our office within a few days to 1 week by e-mail or mail. We value your feedback.  Marsa Officer, DO [facility], [place]

## 2024-01-14 NOTE — Progress Notes (Signed)
 Subjective:    Patient ID: Barbara Santos, female    DOB: [birth year], 88 y.o.   MRN: [id number]  Barbara Santos is a 88 y.o. female presenting on 01/14/2024 for Medical Management of Chronic Issues  Patient presents for a same day appointment.   HPI  Discussed the use of AI scribe software for clinical note transcription with the patient, who gave verbal consent to proceed.  History of Present Illness   Barbara Santos is an 88 year old female with diabetes who presents with elevated blood sugar levels.      CHRONIC DM, Type 2: BMI >15 moderate protein calorie malnutrition Last A1c 7.3, now elevated today to 7.7 - Elevated blood glucose levels for the past two weeks without significant dietary changes - Fasting blood glucose readings are the lowest of the day, with some values in the 140s - Highest readings 250-300, rarely following meals She checks CBG x 1 daily, usually fasting AM and alternates to different times a day every few days CBGs: Elevated,  OneTouch Verio Meds: Metformin  XR 500mg  x 1 in AM and x 2 in PM - No gastrointestinal side effects from metformin  - Previous trials of newer diabetes medications discontinued due to prohibitive cost  Lifestyle: - Diet - often eating baked chicken and higher protein supplement drinks. 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) - Desires weight gain but finds it challenging due to diabetes - Careful dietary monitoring in an effort to manage both weight and glycemic control   UTD DM Eye visit  Eye twice per year, last 07/2021 - Nocturia occurs approximately three times per night - Attempts to limit evening fluid intake, but requires water to take evening medications  Denies hypoglycemia   HM  Flu Shot today     10/09/2023   10:29 AM 04/06/2023    9:52 AM 01/26/2023    9:12 AM  Depression screen PHQ 2/9  Decreased Interest 0  0 0  Down, Depressed, Hopeless 0 0 0  PHQ - 2 Score 0 0 0  Altered sleeping 0 3 0  Tired, decreased energy 0 0 0  Change in appetite 0 0 0  Feeling bad or failure about yourself  0 0 0  Trouble concentrating 0 0 0  Moving slowly or fidgety/restless 0 0 0  Suicidal thoughts 0 0 0  PHQ-9 Score 0 3 0  Difficult doing work/chores Not difficult at all  Not difficult at all       10/09/2023   10:29 AM 04/06/2023    9:52 AM 10/04/2022    8:45 AM 04/04/2022    8:38 AM  GAD 7 : Generalized Anxiety Score  Nervous, Anxious, on Edge 0 0 0 0  Control/stop worrying 0 0 0 0  Worry too much - different things 0 0 0 0  Trouble relaxing 0 0 0 0  Restless 0 0 0 0  Easily annoyed or irritable 0 0 0 0  Afraid - awful might happen 0 0 0 0  Total GAD 7 Score 0 0 0 0  Anxiety Difficulty Not difficult at all  Not difficult at all Not difficult at all    Social History   Tobacco Use   Smoking status: Never   Smokeless tobacco: Never  Vaping Use   Vaping status: Never Used  Substance Use Topics   Alcohol use: No    Alcohol/week: 0.0 standard drinks of alcohol  Drug use: No    Review of Systems Per HPI unless specifically indicated above     Objective:    BP 118/68 (BP Location: Right Arm, Patient Position: Sitting, Cuff Size: Normal)   Pulse 74   Ht 5' 1 (1.549 m)   Wt 82 lb 8 oz (37.4 kg)   SpO2 94%   BMI 15.59 kg/m   Wt Readings from Last 3 Encounters:  01/14/24 82 lb 8 oz (37.4 kg)  10/09/23 83 lb 6 oz (37.8 kg)  06/04/23 88 lb (39.9 kg)    Physical Exam Vitals and nursing note reviewed.  Constitutional:      General: She is not in acute distress.    Appearance: Normal appearance. She is well-developed. She is not diaphoretic.     Comments: Well-appearing, comfortable, cooperative  HENT:     Head: Normocephalic and atraumatic.  Eyes:     General:        Right eye: No discharge.        Left eye: No discharge.     Conjunctiva/sclera: Conjunctivae normal.   Cardiovascular:     Rate and Rhythm: Normal rate.  Pulmonary:     Effort: Pulmonary effort is normal.  Skin:    General: Skin is warm and dry.     Findings: No erythema or rash.  Neurological:     Mental Status: She is alert and oriented to person, place, and time.  Psychiatric:        Mood and Affect: Mood normal.        Behavior: Behavior normal.        Thought Content: Thought content normal.     Comments: Well groomed, good eye contact, normal speech and thoughts     Results for orders placed or performed in visit on 01/14/24  POCT HgB A1C   Collection Time: 01/14/24  3:00 PM  Result Value Ref Range   Hemoglobin A1C 7.7 (A) 4.0 - 5.6 %   HbA1c POC (<> result, manual entry)     HbA1c, POC (prediabetic range)     HbA1c, POC (controlled diabetic range)        Assessment & Plan:   Problem List Items Addressed This Visit     Type 2 diabetes mellitus with other specified complication (HCC) - Primary   Relevant Medications   metFORMIN  (GLUCOPHAGE -XR) 500 MG 24 hr tablet   Other Relevant Orders   POCT HgB A1C (Completed)   AMB Referral VBCI Care Management   Other Visit Diagnoses       Flu vaccine need       Relevant Orders   Flu vaccine HIGH DOSE PF(Fluzone Trivalent) (Completed)     Hyperglycemia            Type 2 diabetes mellitus with hyperglycemia Chronic type 2 diabetes with rising blood glucose levels. A1c increased to 7.7 from prior 7.3. She has fear of Hyperglycemia We discusssed her home readings despite some higher postprandial 200-300. She often has 140s fasting. Avg with 7.7 A1c converts to about 170-180 CBG Reassurance provided  Current metformin  regimen insufficient. Discussed hypoglycemia risks with glipizide and cost barriers to newer medications (she has looked into GLP1, SGLT2 DPP4)  - Increase metformin  to 2 pills in the morning and 2 pills in the evening.  - Refer to [facility] Clinical pharmacy [place], the pharmacist, for insurance coverage  and medication affordability discussion.  - Prefer to avoid sulfonylurea oral in this patient. However depending on progress, we can consider  glipizide very low dose only as as needed if blood sugar consistently 250-300 mg/dL, with strict hypoglycemia precautions. - Monitor blood sugar levels, focus on morning readings.       Orders Placed This Encounter  Procedures   Flu vaccine HIGH DOSE PF(Fluzone Trivalent)   AMB Referral VBCI Care Management    Referral Priority:   Routine    Referral Type:   Consultation    Referral Reason:   Care Coordination    Number of Visits Requested:   1   POCT HgB A1C    Meds ordered this encounter  Medications   metFORMIN  (GLUCOPHAGE -XR) 500 MG 24 hr tablet    Sig: Take 2 tablets (1,000 mg total) by mouth 2 (two) times daily with a meal.    Dispense:  360 tablet    Refill:  3    Add future refills    Follow up plan: Return if symptoms worsen or fail to improve.   Marsa Officer, DO [facility] 01/14/2024, 3:01 PM

## 2024-01-15 ENCOUNTER — Telehealth: Payer: Self-pay

## 2024-01-15 NOTE — Progress Notes (Signed)
 Care Guide Pharmacy Note  01/15/2024 Name: [name] MRN: [id number] DOB: Aug 22, 1935  Referred By: Edman Marsa PARAS, DO Reason for referral: Complex Care Management (Outreach to schedule with Pharm d )   Barbara Santos is a 88 y.o. year old female who is a primary care patient of Edman Marsa PARAS, DO.  ESTEFANY GOEBEL was referred to the pharmacist for assistance related to: DMII  Successful contact was made with the patient to discuss pharmacy services including being ready for the pharmacist to call at least 5 minutes before the scheduled appointment time and to have medication bottles and any blood pressure readings ready for review. The patient agreed to meet with the pharmacist via face to face  on (date/time).  Jeoffrey Buffalo , RMA     [facility]  [facility] Guide  Direct Dial: [phone]  Website: [email or web address]

## 2024-01-15 NOTE — Progress Notes (Signed)
 Care Guide Pharmacy Note  01/15/2024 Name: NOHELI MELDER MRN: [id number] DOB: [birth year]  Referred By: Edman Marsa PARAS, DO Reason for referral: Complex Care Management (Outreach to schedule with Pharm d )   GENETTE HUERTAS is a 88 y.o. year old female who is a primary care patient of Edman, Marsa PARAS, DO.  JENAI SCALETTA was referred to the pharmacist for assistance related to: DMII  An unsuccessful telephone outreach was attempted today to contact the patient who was referred to the pharmacy team for assistance with medication management. Additional attempts will be made to contact the patient.  Jeoffrey Buffalo , RMA     [facility]  [facility] Guide  Direct Dial: [phone]  Website: [email or web address]

## 2024-01-21 ENCOUNTER — Ambulatory Visit

## 2024-01-30 ENCOUNTER — Ambulatory Visit: Admitting: Pharmacist

## 2024-01-30 VITALS — BP 139/62 | HR 80

## 2024-01-30 DIAGNOSIS — E1169 Type 2 diabetes mellitus with other specified complication: Secondary | ICD-10-CM

## 2024-01-30 DIAGNOSIS — Z7984 Long term (current) use of oral hypoglycemic drugs: Secondary | ICD-10-CM

## 2024-01-30 NOTE — Patient Instructions (Signed)
 Goals Addressed             This Visit's Progress    Pharmacy Goals       The goal A1c is less than 7%. This is the best way to reduce the risk of the long term complications of diabetes, including heart disease, kidney disease, eye disease, strokes, and nerve damage. An A1c of less than 7% corresponds with fasting sugars less than 130 and 2 hour after meal sugars less than 180.   Our goal bad cholesterol, or LDL, is less than 70 . This is why it is important to continue taking your rosuvastatin .  Check your blood pressure once weekly, and any time you have concerning symptoms like headache, chest pain, dizziness, shortness of breath, or vision changes.   To appropriately check your blood pressure, make sure you do the following:  1) Avoid caffeine, exercise, or tobacco products for 30 minutes before checking. Empty your bladder. 2) Sit with your back supported in a flat-backed chair. Rest your arm on something flat (arm of the chair, table, etc). 3) Sit still with your feet flat on the floor, resting, for at least 5 minutes.  4) Check your blood pressure. Take 1-2 readings.  5) Write down these readings and bring with you to any provider appointments.  Bring your home blood pressure machine with you to a provider's office for accuracy comparison at least once a year.   Make sure you take your blood pressure medications before you come to any office visit, even if you were asked to fast for labs.  Sharyle Sia, PharmD, [facility] [phone]

## 2024-01-30 NOTE — Progress Notes (Addendum)
 01/30/2024 Name: Barbara Santos MRN: [id number] DOB: [birth year]  Chief Complaint  Patient presents with   Medication Management   Medication Assistance    Barbara Santos is a 88 y.o. year old female who presented for a face to face visit.   They were referred to the pharmacist by their PCP for assistance in managing diabetes and medication access.    Subjective:  Care Team: Primary Care Provider: Edman Marsa PARAS, DO ; Next Scheduled Visit: 04/10/2024  Medication Access/Adherence  Current Pharmacy:  [id number] - [place], [place] - [address] [place] [address] Phone: [phone] Fax: [phone]  [facility] - [place], [place] - [facility] AT Portal to Registered [address] [place] [address] Phone: [phone] Fax: [phone]  [id number] - [place], [place] - [address] [place] [address] Phone: [phone] Fax: [phone]   Patient reports affordability concerns with their medications: Yes  Patient reports access/transportation concerns to their pharmacy: No  Patient reports adherence concerns with their medications:  No     Diabetes:  Current medications: metformin  ER 500 mg - 2 tablets (1000 mg) twice daily (Increased to current dose on 01/15/2024)  Current glucose readings: recent morning fasting ranging: 117-194  Patient denies hypoglycemic s/sx including dizziness, shakiness, sweating.   Patient interested in adding an additional medication for tighter blood sugar control - Prefer to avoid medications, such as GLP-1 receptor agonist, that may decrease appetite  Current meal patterns:  - Breakfast: 1/2 bowl of Cheerios + 3 slices of banana + slice of toast with sugar free jelly + Boost or Ensure (low sugar) + < 1 cup decaf coffee - Lunch: 1/2 chicken salad or tomato sandwich + a few chips + sugar free cookie - Supper:  piece of baked chicken with sugar free BBQ sauce + green beans + squash casserole (onion + cream of chicken soup + squash + butter + bread crumbs on top)  - Snacks: peanuts or peanut butter cracker - Drinks: water + occasionally lemonade  Current physical activity: walks throughout the day (uses cane). Has walker to use when needed   Macrovascular and Microvascular Risk Reduction:  Statin? yes (rosuvastatin  5 mg daily); ACEi/ARB? yes (valsartan  60 mg daily) Last urinary albumin/creatinine ratio:  Lab Results  Component Value Date   MICRALBCREAT 33 (H) 10/04/2022   MICRALBCREAT 54 (H) 09/27/2021   Last eye exam:  Lab Results  Component Value Date   HMDIABEYEEXA No Retinopathy 09/20/2023   Last foot exam: 04/06/2023 Tobacco Use:  Tobacco Use: Low Risk  (01/14/2024)   Patient History    Smoking Tobacco Use: Never    Smokeless Tobacco Use: Never    Passive Exposure: Not on file    Hypertension:  Current medications: valsartan  40 mg - 1.5 tablets (60 mg) daily  Patient has an automated, upper arm home BP cuff; last checked today; recalls reading: 143/68  In Office today: 139/62, HR 80  Patient denies hypotensive s/sx including dizziness, lightheadedness.    Current physical activity: walks throughout the day (uses cane). Has walker to use when needed    Objective:  Lab Results  Component Value Date   HGBA1C 7.7 (A) 01/14/2024    Lab Results  Component Value Date   CREATININE 0.57 (L) 03/30/2023   BUN 14 03/30/2023   NA 132 (L) 03/30/2023   K 5.1 03/30/2023   CL 95 (L) 03/30/2023  CO2 29 03/30/2023    Lab Results  Component Value Date   CHOL 138 03/30/2023   HDL 71 03/30/2023   LDLCALC 53 03/30/2023   TRIG 61 03/30/2023   CHOLHDL 1.9 03/30/2023   BP Readings from Last 3 Encounters:  01/30/24 139/62  01/14/24 118/68  10/09/23 138/84   Pulse Readings from Last 3 Encounters:  01/30/24 80  01/14/24 74  10/09/23 80     Medications Reviewed Today      Reviewed by Alana Sharyle LABOR, RPH-CPP (Pharmacist) on 01/30/24 at 1348  Med List Status: <None>   Medication Order Taking? Sig Documenting Provider Last Dose Status Informant  Blood Glucose Monitoring Suppl (ONETOUCH VERIO IQ SYSTEM) w/Device KIT [id number]  Use to check blood sugar up to 2 x daily Edman Marsa PARAS, DO  Active   Cholecalciferol (VITAMIN D) 2000 UNITS tablet [id number] Yes Take 2,000 Units by mouth daily. [provider]  Active   diphenhydrAMINE (BENADRYL) 25 MG tablet [id number]  Take 25 mg by mouth every 6 (six) hours as needed.  Patient not taking: Reported on 01/14/2024   [provider]  Active   dorzolamide-timolol  (COSOPT) 22.3-6.8 MG/ML ophthalmic solution [id number] Yes Place 1 drop into both eyes 2 (two) times daily.  [provider]  Active            Med Note Barbara Santos, Barbara Santos Schaumann Oct 21, 2015  8:48 AM) Received from: External Pharmacy Received Sig: INSTILL ONE DROP IN OU BID  glucose blood (ONETOUCH VERIO) test strip [id number]  USE TO CHECK BLOOD SUGAR ONCE A DAY. DX E11.9 Edman Marsa PARAS, DO  Active   ketoconazole  (NIZORAL ) 2 % cream [id number] Yes APPLY 1 APPLICATION TOPICALLY AT BEDTIME. APPLY TO FEET Hester Alm BROCKS, MD  Active   lactose free nutrition (BOOST) LIQD [id number]  Take 237 mLs by mouth 3 (three) times daily between meals. [provider]  Active            Med Note (HILL, TIFFANY A   Tue Oct 24, 2016 10:06 AM) Drinks 1 a day  levothyroxine  (SYNTHROID ) 50 MCG tablet [id number] Yes Take 1 tablet (50 mcg total) by mouth daily before breakfast. Edman Marsa PARAS, DO  Active   metFORMIN  (GLUCOPHAGE -XR) 500 MG 24 hr tablet [id number] Yes Take 2 tablets (1,000 mg total) by mouth 2 (two) times daily with a meal. Edman, Marsa PARAS, DO  Active   Multiple Vitamin (MULTIVITAMIN) tablet [id number] Yes Take 1 tablet by mouth daily. [provider]  Active   Nutritional Supplements (JUICE  PLUS FIBRE PO) [id number]  Take by mouth. [provider]  Active   [facility] LANCETS FINE MISC [id number]  1 each by Other route 3 (three) times daily. [provider]  Active            Med Note Barbara Santos, Barbara Santos   Thu Oct 21, 2015  8:48 AM) Received from: External Pharmacy Received Sig: TEST ONCE A DAY AS DIRECTED.  polyethylene glycol powder (GLYCOLAX /MIRALAX ) 17 GM/SCOOP powder [id number] Yes Take 17-34 g by mouth daily as needed for moderate constipation. May increase up to 3 to 4 capfuls if needed short term for constipation. Edman Marsa PARAS, DO  Active   rosuvastatin  (CRESTOR ) 5 MG tablet [id number] Yes Take 1 tablet (5 mg total) by mouth at bedtime. Edman Marsa PARAS, DO  Active   valsartan  (DIOVAN ) 40 MG tablet [id number] Yes Take 1.5 tablets (60 mg total) by mouth daily. Karamalegos,  Marsa PARAS, DO  Active               Assessment/Plan:   Comprehensive medication review performed; medication list updated in electronic medical record - Counsel patient to use OTC second-generation antihistamine medication such as generic Zyrtec or Claritin for his allergies, rather than diphenhydramine (Benadryl) to reduce risk of sedation, confusion and other anticholinergic effects, particularly given patient's age >= 63.  Reports that she plans to review Medicare plans for 2026 and may switch to a new plan  Diabetes: - Reviewed long term cardiovascular and renal outcomes of uncontrolled blood sugar. - Reviewed dietary modifications including importance of having regular well-balanced meals and snacks throughout the day, while controlling carbohydrate portion sizes             Encourage patient to avoid consumption of sugary beverages  Work on balance for breakfast meal (recommend adding protein)  Encourage to review nutrition labels for carbohydrate content of foods - Discuss with patient diabetes medication management options. Patient interested in starting  DPP4-inhibitor, such as Januvia, for tighter blood sugar control Based on reported income, patient meets criteria for Extra Help through [employer]. Unable to complete this application today as patient does not have financial information with her. Schedule an appointment to complete Advise that even if she/he is denied, to retain denial letter as this will be needed for applying for manufacturer patient assistance program.  - Recommend to continue to monitor home blood sugar, keep log of results and have this record to review at upcoming medical appointments. Patient to contact provider office sooner if needed for readings outside of established parameters or symptoms   Hypertension: - Reviewed appropriate blood pressure monitoring technique and reviewed goal blood pressure.  - Recommend to monitor home blood pressure, keep log of results and have this record to review at upcoming medical appointments. Patient to contact provider office sooner if needed for readings outside of established parameters or symptoms    Follow Up Plan: Clinical Pharmacist will follow up with patient by telephone on 02/01/2024 at 9:00 AM   Sharyle Sia, PharmD, JAQUELINE, CPP Clinical Pharmacist [facility] [phone]

## 2024-01-31 ENCOUNTER — Ambulatory Visit: Payer: Medicare HMO

## 2024-01-31 VITALS — Ht 61.0 in | Wt 82.0 lb

## 2024-01-31 DIAGNOSIS — Z0000 Encounter for general adult medical examination without abnormal findings: Secondary | ICD-10-CM

## 2024-01-31 NOTE — Patient Instructions (Addendum)
 [name],  Thank you for taking the time for your Medicare Wellness Visit. I appreciate your continued commitment to your health goals. Please review the care plan we discussed, and feel free to reach out if I can assist you further.  Medicare recommends these wellness visits once per year to help you and your care team stay ahead of potential health issues. These visits are designed to focus on prevention, allowing your provider to concentrate on managing your acute and chronic conditions during your regular appointments.  Please note that Annual Wellness Visits do not include a physical exam. Some assessments may be limited, especially if the visit was conducted virtually. If needed, we may recommend a separate in-person follow-up with your provider.  Ongoing Care Seeing your primary care provider every 3 to 6 months helps us  monitor your health and provide consistent, personalized care.   Referrals If a referral was made during today's visit and you haven't received any updates within two weeks, please contact the referred provider directly to check on the status.  Recommended Screenings:  Health Maintenance  Topic Date Due   DTaP/Tdap/Td vaccine (2 - Td or Tdap) 12/04/2023   COVID-19 Vaccine (6 - 2025-26 season) 12/24/2023   Complete foot exam   04/05/2024   Hemoglobin A1C  07/13/2024   Eye exam for diabetics  09/19/2024   Medicare Annual Wellness Visit  01/30/2025   Pneumococcal Vaccine for age over 36  Completed   Flu Shot  Completed   DEXA scan (bone density measurement)  Completed   Zoster (Shingles) Vaccine  Completed   Meningitis B Vaccine  Aged Out       01/31/2024    2:16 PM  Advanced Directives  Does Patient Have a Medical Advance Directive? Yes  Type of Estate agent of [place];Living will  Copy of Healthcare Power of Attorney in Chart? No - copy requested   Advance Care Planning is important because it: Ensures you receive medical care  that aligns with your values, goals, and preferences. Provides guidance to your family and loved ones, reducing the emotional burden of decision-making during critical moments.  Vision: Annual vision screenings are recommended for early detection of glaucoma, cataracts, and diabetic retinopathy. These exams can also reveal signs of chronic conditions such as diabetes and high blood pressure.  Dental: Annual dental screenings help detect early signs of oral cancer, gum disease, and other conditions linked to overall health, including heart disease and diabetes.  Please see the attached documents for additional preventive care recommendations.

## 2024-01-31 NOTE — Progress Notes (Signed)
 Subjective:   Barbara Santos is a 88 y.o. who presents for a Medicare Wellness preventive visit.  As a reminder, Annual Wellness Visits don't include a physical exam, and some assessments may be limited, especially if this visit is performed virtually. We may recommend an in-person follow-up visit with your provider if needed.  Visit Complete: Virtual I connected with  Der D Goody on 01/31/24 by a audio enabled telemedicine application and verified that I am speaking with the correct person using two identifiers.  Patient Location: Home  Provider Location: Home Office  I discussed the limitations of evaluation and management by telemedicine. The patient expressed understanding and agreed to proceed.  Vital Signs: Because this visit was a virtual/telehealth visit, some criteria may be missing or patient reported. Any vitals not documented were not able to be obtained and vitals that have been documented are patient reported.    Persons Participating in Visit: Patient.  AWV Questionnaire: No: Patient Medicare AWV questionnaire was not completed prior to this visit.  Cardiac Risk Factors include: advanced age (>74men, >89 women);diabetes mellitus;hypertension     Objective:    Today's Vitals   01/31/24 1408  Weight: 82 lb (37.2 kg)  Height: 5' 1 (1.549 m)   Body mass index is 15.49 kg/m.     01/31/2024    2:16 PM 01/26/2023    9:14 AM 01/20/2022    9:34 AM 01/11/2021    2:40 PM 01/28/2020    6:25 AM 01/06/2020    1:23 PM 12/31/2019    6:44 AM  Advanced Directives  Does Patient Have a Medical Advance Directive? Yes No No Yes Yes Yes Yes  Type of Estate agent of [place];Living will   Healthcare Power of [place];Living will Healthcare Power of [place];Living will Living will;Healthcare Power of [employer] Power of [place];Living will  Does patient want to make changes to medical advance directive?     No - Patient declined  No -  Patient declined  Copy of Healthcare Power of Attorney in Chart? No - copy requested   No - copy requested Yes - validated most recent copy scanned in chart (See row information) No - copy requested Yes - validated most recent copy scanned in chart (See row information)  Would patient like information on creating a medical advance directive?  No - Patient declined No - Patient declined        Current Medications (verified) Outpatient Encounter Medications as of 01/31/2024  Medication Sig   Blood Glucose Monitoring Suppl (ONETOUCH VERIO IQ SYSTEM) w/Device KIT Use to check blood sugar up to 2 x daily   Cholecalciferol (VITAMIN D) 2000 UNITS tablet Take 2,000 Units by mouth daily.   diphenhydrAMINE (BENADRYL) 25 MG tablet Take 25 mg by mouth every 6 (six) hours as needed. (Patient not taking: Reported on 01/14/2024)   dorzolamide-timolol  (COSOPT) 22.3-6.8 MG/ML ophthalmic solution Place 1 drop into both eyes 2 (two) times daily.    glucose blood (ONETOUCH VERIO) test strip USE TO CHECK BLOOD SUGAR ONCE A DAY. DX E11.9   ketoconazole  (NIZORAL ) 2 % cream APPLY 1 APPLICATION TOPICALLY AT BEDTIME. APPLY TO FEET   lactose free nutrition (BOOST) LIQD Take 237 mLs by mouth 3 (three) times daily between meals.   levothyroxine  (SYNTHROID ) 50 MCG tablet Take 1 tablet (50 mcg total) by mouth daily before breakfast.   metFORMIN  (GLUCOPHAGE -XR) 500 MG 24 hr tablet Take 2 tablets (1,000 mg total) by mouth 2 (two) times daily with a  meal.   Multiple Vitamin (MULTIVITAMIN) tablet Take 1 tablet by mouth daily.   Nutritional Supplements (JUICE PLUS FIBRE PO) Take by mouth.   ONETOUCH DELICA LANCETS FINE MISC 1 each by Other route 3 (three) times daily.   polyethylene glycol powder (GLYCOLAX /MIRALAX ) 17 GM/SCOOP powder Take 17-34 g by mouth daily as needed for moderate constipation. May increase up to 3 to 4 capfuls if needed short term for constipation.   rosuvastatin  (CRESTOR ) 5 MG tablet Take 1 tablet (5 mg total)  by mouth at bedtime.   valsartan  (DIOVAN ) 40 MG tablet Take 1.5 tablets (60 mg total) by mouth daily.   No facility-administered encounter medications on file as of 01/31/2024.    Allergies (verified) Amoxicillin and Penicillins   History: Past Medical History:  Diagnosis Date   Arthritis    Basal cell carcinoma 08/23/2009   Right dorsum lat. distal forearm.    Basal cell carcinoma 06/29/2021   R forehead - tx with ED&C   Diabetes mellitus, type 2 (HCC)    Dysplastic nevus 08/03/2008   Right ant. thigh, just above knee. Moderate to marked atypia, extends to base. Excised 09/23/2008, margins free.   Dysplastic nevus 08/23/2009   Right post. sup. thigh. MIld atypia, limited margins free.   Glaucoma    Hx of basal cell carcinoma    R cheek   Hx of squamous cell carcinoma of skin 11/01/2009   L medial mid calf   Hyperlipidemia    Hypothyroidism    Insomnia    Left hip pain    Protein calorie malnutrition    Squamous cell carcinoma of skin 08/23/2009   Left medial mid calf. SCCis   Past Surgical History:  Procedure Laterality Date   CATARACT EXTRACTION W/PHACO Left 12/31/2019   Procedure: CATARACT EXTRACTION PHACO AND INTRAOCULAR LENS PLACEMENT (IOC) LEFT DIABETIC 10.20  01:17.6  13.1%;  Surgeon: Mittie Gaskin, MD;  Location: [facility];  Service: Ophthalmology;  Laterality: Left;  Diabetic - oral meds   CATARACT EXTRACTION W/PHACO Right 01/28/2020   Procedure: CATARACT EXTRACTION PHACO AND INTRAOCULAR LENS PLACEMENT (IOC) RIGHT DIABETIC 13.67 01:34.5 14.5%;  Surgeon: Mittie Gaskin, MD;  Location: [facility];  Service: Ophthalmology;  Laterality: Right;   COLONOSCOPY WITH PROPOFOL  N/A 12/31/2017   Procedure: COLONOSCOPY WITH PROPOFOL ;  Surgeon: Viktoria Lamar DASEN, MD;  Location: [facility];  Service: Endoscopy;  Laterality: N/A;   HERNIA REPAIR     TOTAL ABDOMINAL HYSTERECTOMY  2009   Family History  Problem Relation Age of Onset   Diabetes  Mellitus II Mother    Stroke Father    Breast cancer Sister 58   Breast cancer Sister    Stroke Maternal Grandmother    Heart disease Paternal Grandfather    Social History   Socioeconomic History   Marital status: Widowed    Spouse name: Not on file   Number of children: Not on file   Years of education: Not on file   Highest education level: High school graduate  Occupational History   Not on file  Tobacco Use   Smoking status: Never   Smokeless tobacco: Never  Vaping Use   Vaping status: Never Used  Substance and Sexual Activity   Alcohol use: No    Alcohol/week: 0.0 standard drinks of alcohol   Drug use: No   Sexual activity: Not on file  Other Topics Concern   Not on file  Social History Narrative   Not on file   Social Drivers of  Health   Financial Resource Strain: Low Risk  (01/31/2024)   Overall Financial Resource Strain (CARDIA)    Difficulty of Paying Living Expenses: Not hard at all  Food Insecurity: No Food Insecurity (01/31/2024)   Hunger Vital Sign    Worried About Running Out of Food in the Last Year: Never true    Ran Out of Food in the Last Year: Never true  Transportation Needs: No Transportation Needs (01/31/2024)   PRAPARE - Administrator, Civil Service (Medical): No    Lack of Transportation (Non-Medical): No  Physical Activity: Inactive (01/31/2024)   Exercise Vital Sign    Days of Exercise per Week: 0 days    Minutes of Exercise per Session: 0 min  Stress: No Stress Concern Present (01/31/2024)   [employer] of Occupational Health - Occupational Stress Questionnaire    Feeling of Stress: Not at all  Social Connections: Moderately Integrated (01/31/2024)   Social Connection and Isolation Panel    Frequency of Communication with Friends and Family: More than three times a week    Frequency of Social Gatherings with Friends and Family: More than three times a week    Attends Religious Services: More than 4 times per year     Active Member of [employer] or Organizations: Yes    Attends Banker Meetings: More than 4 times per year    Marital Status: Widowed    Tobacco Counseling Counseling given: Not Answered    Clinical Intake:  Pre-visit preparation completed: Yes  Pain : No/denies pain     BMI - recorded: 15.49 Nutritional Status: BMI <19  Underweight Nutritional Risks: None Diabetes: Yes CBG done?: Yes (CBG 131 Per patient) CBG resulted in Enter/ Edit results?: Yes Did pt. bring in CBG monitor from home?: No  Lab Results  Component Value Date   HGBA1C 7.7 (A) 01/14/2024   HGBA1C 7.3 (A) 10/09/2023   HGBA1C 8.7 (H) 03/30/2023     How often do you need to have someone help you when you read instructions, pamphlets, or other written materials from your doctor or pharmacy?: 1 - Never  Interpreter Needed?: No  Information entered by :: Rojelio Blush LPN   Activities of Daily Living     01/31/2024    2:15 PM  In your present state of health, do you have any difficulty performing the following activities:  Hearing? 0  Vision? 0  Difficulty concentrating or making decisions? 0  Walking or climbing stairs? 1  Comment Uses a Cane  Dressing or bathing? 0  Doing errands, shopping? 0  Preparing Food and eating ? N  Using the Toilet? N  In the past six months, have you accidently leaked urine? N  Do you have problems with loss of bowel control? N  Managing your Medications? N  Managing your Finances? N  Housekeeping or managing your Housekeeping? N    Patient Care Team: Edman Marsa PARAS, DO as PCP - General (Family Medicine) [facility], [facility] (Optometry)  I have updated your Care Teams any recent Medical Services you may have received from other providers in the past year.     Assessment:   This is a routine wellness examination for Teckla.  Hearing/Vision screen Hearing Screening - Comments:: Denies hearing difficulties   Vision Screening - Comments::  Wears rx glasses - up to date with routine eye exams with  [facility]   Goals Addressed  This Visit's Progress     Increase physical activity (pt-stated)        Get more active.       Depression Screen     01/31/2024    2:14 PM 10/09/2023   10:29 AM 04/06/2023    9:52 AM 01/26/2023    9:12 AM 10/04/2022    8:45 AM 04/04/2022    8:38 AM 01/20/2022    9:32 AM  PHQ 2/9 Scores  PHQ - 2 Score 0 0 0 0 0 0 1  PHQ- 9 Score  0 3 0 0 0 1    Fall Risk     01/31/2024    2:16 PM 10/09/2023   10:28 AM 04/06/2023    9:52 AM 01/26/2023    9:14 AM 10/04/2022    8:46 AM  Fall Risk   Falls in the past year? 1 1 1 1  0  Number falls in past yr: 0 1 1 0 0  Injury with Fall? 0 0 0 0 0  Risk for fall due to : No Fall Risks History of fall(s)  History of fall(s) No Fall Risks  Follow up Falls evaluation completed Falls evaluation completed  Falls evaluation completed;Falls prevention discussed Falls evaluation completed    MEDICARE RISK AT HOME:  Medicare Risk at Home Any stairs in or around the home?: Yes If so, are there any without handrails?: No Home free of loose throw rugs in walkways, pet beds, electrical cords, etc?: Yes Adequate lighting in your home to reduce risk of falls?: Yes Life alert?: No Use of a cane, walker or w/c?: Yes Grab bars in the bathroom?: Yes Shower chair or bench in shower?: Yes Elevated toilet seat or a handicapped toilet?: Yes  TIMED UP AND GO:  Was the test performed?  No  Cognitive Function: 6CIT completed    11/06/2014    2:24 PM  MMSE - Mini Mental State Exam  Orientation to time 5   Orientation to Place 5   Registration 3   Attention/ Calculation 5   Recall 3   Language- name 2 objects 2   Language- repeat 1  Language- follow 3 step command 3   Language- read & follow direction 1   Write a sentence 1   Copy design 1   Total score 30      Data saved with a previous flowsheet row definition        01/31/2024     2:16 PM 01/26/2023    9:16 AM 01/20/2022    9:40 AM 01/11/2021    2:42 PM 01/06/2020    1:27 PM  6CIT Screen  What Year? 0 points 0 points 0 points 0 points 0 points  What month? 0 points 0 points 0 points 0 points 0 points  What time? 0 points 0 points 0 points 0 points 0 points  Count back from 20 0 points 0 points 0 points 0 points 0 points  Months in reverse 0 points 0 points 0 points 0 points 2 points  Repeat phrase 0 points 0 points 0 points 0 points 0 points  Total Score 0 points 0 points 0 points 0 points 2 points    Immunizations Immunization History  Administered Date(s) Administered   Fluad Quad(high Dose 65+) 01/07/2020, 03/22/2021, 04/04/2022   Fluad Trivalent(High Dose 65+) 01/26/2023   INFLUENZA, HIGH DOSE SEASONAL PF 01/05/2015, 02/24/2016, 01/16/2017, 01/14/2018, 01/06/2019, 01/14/2024   Influenza-Unspecified 01/28/2014, 01/14/2018   PFIZER Comirnaty(Gray Top)Covid-19 Tri-Sucrose Vaccine 05/14/2019, 06/07/2019  PFIZER(Purple Top)SARS-COV-2 Vaccination 05/14/2019, 06/07/2019, 02/25/2020   Pneumococcal Conjugate-13 12/03/2013   Pneumococcal Polysaccharide-23 10/24/2016   Tdap 12/03/2013   Zoster Recombinant(Shingrix) 12/04/2018, 02/17/2019, 03/06/2019   Zoster, Live 04/24/2010    Screening Tests Health Maintenance  Topic Date Due   DTaP/Tdap/Td (2 - Td or Tdap) 12/04/2023   COVID-19 Vaccine (6 - 2025-26 season) 12/24/2023   FOOT EXAM  04/05/2024   HEMOGLOBIN A1C  07/13/2024   OPHTHALMOLOGY EXAM  09/19/2024   Medicare Annual Wellness (AWV)  01/30/2025   Pneumococcal Vaccine: 50+ Years  Completed   Influenza Vaccine  Completed   DEXA SCAN  Completed   Zoster Vaccines- Shingrix  Completed   Meningococcal B Vaccine  Aged Out    Health Maintenance Items Addressed:   Additional Screening:  Vision Screening: Recommended annual ophthalmology exams for early detection of glaucoma and other disorders of the eye. Is the patient up to date with their annual eye  exam?  Yes  Who is the provider or what is the name of the office in which the patient attends annual eye exams? [facility]  Dental Screening: Recommended annual dental exams for proper oral hygiene  Community Resource Referral / Chronic Care Management: CRR required this visit?  No   CCM required this visit?  No   Plan:    I have personally reviewed and noted the following in the patient's chart:   Medical and social history Use of alcohol, tobacco or illicit drugs  Current medications and supplements including opioid prescriptions. Patient is not currently taking opioid prescriptions. Functional ability and status Nutritional status Physical activity Advanced directives List of other physicians Hospitalizations, surgeries, and ER visits in previous 12 months Vitals Screenings to include cognitive, depression, and falls Referrals and appointments  In addition, I have reviewed and discussed with patient certain preventive protocols, quality metrics, and best practice recommendations. A written personalized care plan for preventive services as well as general preventive health recommendations were provided to patient.   Rojelio LELON Blush, LPN   [id number]/7974   After Visit Summary: (MyChart) Due to this being a telephonic visit, the after visit summary with patients personalized plan was offered to patient via MyChart   Notes: Nothing significant to report at this time.

## 2024-02-01 ENCOUNTER — Other Ambulatory Visit (INDEPENDENT_AMBULATORY_CARE_PROVIDER_SITE_OTHER): Admitting: Pharmacist

## 2024-02-01 DIAGNOSIS — Z7984 Long term (current) use of oral hypoglycemic drugs: Secondary | ICD-10-CM

## 2024-02-01 DIAGNOSIS — E1169 Type 2 diabetes mellitus with other specified complication: Secondary | ICD-10-CM

## 2024-02-01 NOTE — Progress Notes (Signed)
 02/01/2024 Name: IDOLINA MANTELL MRN: [id number] DOB: Aug 07, 1935  Chief Complaint  Patient presents with   Medication Assistance    TERRILEE DUDZIK is a 88 y.o. year old female who presented for a telephone visit.   They were referred to the pharmacist by their PCP for assistance in managing diabetes and medication access.    Follow up today as planned to assist patient with completing Extra Help subsidy application online   Subjective:   Care Team: Primary Care Provider: Edman Marsa PARAS, DO ; Next Scheduled Visit: 04/10/2024    Medication Access/Adherence  [facility] Pharmacy:  [id number] [place] [place] - [address] [place] [address] Phone: [phone] Fax: [phone]  [facility] [place], [place] - [address] [facility] AT Portal to Registered [address] [place] [address] Phone: [phone] Fax: [phone]  [id number] [place], [place] - [address] [place] [address] Phone: [phone] Fax: [phone]   Patient reports affordability concerns with their medications: Yes  Patient reports access/transportation concerns to their pharmacy: No  Patient reports adherence concerns with their medications:  No       Diabetes:   Current medications: metformin  ER 500 mg - 2 tablets (1000 mg) twice daily (Increased to current dose on 01/15/2024)   Current glucose readings: today morning fasting: 124   Patient denies hypoglycemic s/sx including dizziness, shakiness, sweating.    Patient interested in starting Januvia, if affordable, to aid with blood sugar control   Current physical activity: walks throughout the day (uses cane). Has walker to use when needed   Objective:  Lab Results  Component Value Date   HGBA1C 7.7 (A) 01/14/2024    Lab Results  Component Value Date   CREATININE 0.57 (L) 03/30/2023   BUN 14 03/30/2023   NA 132  (L) 03/30/2023   K 5.1 03/30/2023   CL 95 (L) 03/30/2023   CO2 29 03/30/2023    Lab Results  Component Value Date   CHOL 138 03/30/2023   HDL 71 03/30/2023   LDLCALC 53 03/30/2023   TRIG 61 03/30/2023   CHOLHDL 1.9 03/30/2023    Current Outpatient Medications on File Prior to Visit  Medication Sig Dispense Refill   Blood Glucose Monitoring Suppl (ONETOUCH VERIO IQ SYSTEM) w/Device KIT Use to check blood sugar up to 2 x daily 1 kit 0   Cholecalciferol (VITAMIN D) 2000 UNITS tablet Take 2,000 Units by mouth daily.     diphenhydrAMINE (BENADRYL) 25 MG tablet Take 25 mg by mouth every 6 (six) hours as needed. (Patient not taking: Reported on 01/14/2024)     dorzolamide-timolol  (COSOPT) 22.3-6.8 MG/ML ophthalmic solution Place 1 drop into both eyes 2 (two) times daily.   0   glucose blood (ONETOUCH VERIO) test strip USE TO CHECK BLOOD SUGAR ONCE A DAY. DX E11.9 100 strip 1   ketoconazole  (NIZORAL ) 2 % cream APPLY 1 APPLICATION TOPICALLY AT BEDTIME. APPLY TO FEET 60 g 11   lactose free nutrition (BOOST) LIQD Take 237 mLs by mouth 3 (three) times daily between meals.     levothyroxine  (SYNTHROID ) 50 MCG tablet Take 1 tablet (50 mcg total) by mouth daily before breakfast. 90 tablet 3   metFORMIN  (GLUCOPHAGE -XR) 500 MG 24 hr tablet Take 2 tablets (1,000 mg total) by mouth 2 (two) times daily with a meal. 360 tablet 3   Multiple Vitamin (MULTIVITAMIN) tablet Take 1  tablet by mouth daily.     Nutritional Supplements (JUICE PLUS FIBRE PO) Take by mouth.     ONETOUCH DELICA LANCETS FINE MISC 1 each by Other route 3 (three) times daily.  0   polyethylene glycol powder (GLYCOLAX /MIRALAX ) 17 GM/SCOOP powder Take 17-34 g by mouth daily as needed for moderate constipation. May increase up to 3 to 4 capfuls if needed short term for constipation. 238 g 3   rosuvastatin  (CRESTOR ) 5 MG tablet Take 1 tablet (5 mg total) by mouth at bedtime. 90 tablet 3   valsartan  (DIOVAN ) 40 MG tablet Take 1.5 tablets (60 mg  total) by mouth daily. 135 tablet 3   No current facility-administered medications on file prior to visit.       Assessment/Plan:   Diabetes: - Have reviewed long term cardiovascular and renal outcomes of uncontrolled blood sugar. - Have reviewed dietary modifications including importance of having regular well-balanced meals and snacks throughout the day, while controlling carbohydrate portion sizes - Have discuss with patient diabetes medication management options. Patient interested in starting DPP4-inhibitor, such as Januvia, for tighter blood sugar control Based on reported income, patient meets criteria for Extra Help through [employer].  Assist patient with completing and submitting the application online today as requested Counsel to expect response regarding approval or denial from Social Security via mail within the next 4 to 6 weeks after submitting application online. Advise that even if she is denied, to retain denial letter as this will be needed for applying for manufacturer patient assistance program.   - Recommend to continue to monitor home blood sugar, keep log of results and have this record to review at upcoming medical appointments. Patient to contact provider office sooner if needed for readings outside of established parameters or symptoms    Follow Up Plan:   Clinical Pharmacist will follow up with patient by telephone on 03/14/2024 at 9:00 AM   Patient agrees to contact Clinical Pharmacist if receives response from Social Security in the mail sooner   Sharyle Sia, PharmD, [place], CPP Clinical Pharmacist [facility] [phone]

## 2024-02-01 NOTE — Patient Instructions (Signed)
 Please watch for a response from Social Security by mail within the next 4 to 6 weeks to let you know if this application is approved or denied. For either outcome, please keep this letter for your records.  If your application for Extra Help is denied, we can assist you with applying to receive Januvia directly from the manufacturer at no cost.  Sharyle Sia, PharmD, [place], CPP Clinical Pharmacist [facility] [phone]

## 2024-02-06 ENCOUNTER — Other Ambulatory Visit

## 2024-02-14 ENCOUNTER — Telehealth: Payer: Self-pay

## 2024-02-14 DIAGNOSIS — E1169 Type 2 diabetes mellitus with other specified complication: Secondary | ICD-10-CM

## 2024-02-14 MED ORDER — TRUE METRIX BLOOD GLUCOSE TEST VI STRP: ORAL_STRIP | 3 refills | Status: DC

## 2024-02-14 MED ORDER — TRUE METRIX METER W/DEVICE KIT: PACK | 0 refills | Status: DC

## 2024-02-14 NOTE — Telephone Encounter (Signed)
 Copied from CRM [id number]. Topic: Clinical - Medication Question >> Feb 14, 2024  9:46 AM Lonell PEDLAR wrote: Reason for CRM: [place] to update supplies for diabetes to trividia true matrix and matrix air. Patient is needing these supplies in place of the ones that she currently has in order for insurance to continue covering this.

## 2024-02-14 NOTE — Addendum Note (Signed)
 Addended by: EDMAN MARSA PARAS on: 02/14/2024 04:23 PM   Modules accepted: Orders

## 2024-03-03 ENCOUNTER — Telehealth: Payer: Self-pay

## 2024-03-03 NOTE — Telephone Encounter (Signed)
 Tried calling patient back regarding a message you left on the nurse line concerning a spot under her eye that is red. Patient states she was seen by Dr. Hester in March and spot has not gone away.  No answer when returning call and unable to leave a voice message. Will try to call again.

## 2024-03-05 ENCOUNTER — Other Ambulatory Visit: Payer: Self-pay | Admitting: Family Medicine

## 2024-03-05 DIAGNOSIS — E1169 Type 2 diabetes mellitus with other specified complication: Secondary | ICD-10-CM

## 2024-03-07 ENCOUNTER — Other Ambulatory Visit: Payer: Self-pay | Admitting: Family Medicine

## 2024-03-07 ENCOUNTER — Telehealth: Payer: Self-pay | Admitting: Family Medicine

## 2024-03-07 DIAGNOSIS — E1169 Type 2 diabetes mellitus with other specified complication: Secondary | ICD-10-CM

## 2024-03-07 NOTE — Telephone Encounter (Signed)
 Too soon for refilll.  Requested Prescriptions  Pending Prescriptions Disp Refills   ONETOUCH VERIO test strip [Pharmacy Med Name: ONE TOUCH VERIO TEST STRIP] 100 strip 1    Sig: USE TO CHECK BLOOD SUGAR ONCE A DAY. DX E11.9     Endocrinology: Diabetes - Testing Supplies Passed - 03/07/2024 11:28 AM      Passed - Valid encounter within last 12 months    Recent Outpatient Visits           1 month ago Type 2 diabetes mellitus with other specified complication, without long-term current use of insulin [facility])   [place] [facility] [place], Marsa PARAS, DO   5 months ago Controlled type 2 diabetes mellitus without complication, without long-term current use of insulin [facility])   [facility] Edman Marsa PARAS, DO   9 months ago Acute non-recurrent frontal sinusitis   [place] [facility] Edman Marsa PARAS, DO   9 months ago Acute non-recurrent frontal sinusitis   [facility] Edman Marsa PARAS, DO       Future Appointments             In 4 months Hester Alm BROCKS, MD [facility]

## 2024-03-07 NOTE — Telephone Encounter (Signed)
 Patient came into office and requested a refill for the following medications:  TRUE METRIX BLOOD GLUCOSE TEST test strip   [facility] [place]     Please advise.

## 2024-03-08 NOTE — Telephone Encounter (Signed)
 Already refilled on 02/14/24 #100/3 refill(s).Patient will need to call the pharmacy.

## 2024-03-10 NOTE — Telephone Encounter (Signed)
 Duplicate request, refilled 02/14/24.  Requested Prescriptions  Pending Prescriptions Disp Refills   ONETOUCH VERIO test strip [Pharmacy Med Name: ONE TOUCH VERIO TEST STRIP] 100 strip 1    Sig: USE TO CHECK BLOOD SUGAR ONCE A DAY. DX E11.9     Endocrinology: Diabetes - Testing Supplies Passed - 03/10/2024 11:10 AM      Passed - Valid encounter within last 12 months    Recent Outpatient Visits           1 month ago Type 2 diabetes mellitus with other specified complication, without long-term current use of insulin [facility])   [facility] [place], Marsa PARAS, DO   5 months ago Controlled type 2 diabetes mellitus without complication, without long-term current use of insulin [facility])   [facility] Marsa PARAS, DO   9 months ago Acute non-recurrent frontal sinusitis   [facility] [place] Marsa PARAS, DO   9 months ago Acute non-recurrent frontal sinusitis   [facility] [place] Ed[place] Marsa PARAS, DO       Future Appointments             In 4 months Hester Alm BROCKS, MD [facility]

## 2024-03-13 DIAGNOSIS — H40001 Preglaucoma, unspecified, right eye: Secondary | ICD-10-CM | POA: Diagnosis not present

## 2024-03-13 DIAGNOSIS — H401122 Primary open-angle glaucoma, left eye, moderate stage: Secondary | ICD-10-CM | POA: Diagnosis not present

## 2024-03-13 DIAGNOSIS — Z961 Presence of intraocular lens: Secondary | ICD-10-CM | POA: Diagnosis not present

## 2024-03-13 DIAGNOSIS — H43822 Vitreomacular adhesion, left eye: Secondary | ICD-10-CM | POA: Diagnosis not present

## 2024-03-14 ENCOUNTER — Other Ambulatory Visit (HOSPITAL_COMMUNITY): Payer: Self-pay

## 2024-03-14 ENCOUNTER — Other Ambulatory Visit: Admitting: Pharmacist

## 2024-03-14 DIAGNOSIS — E1169 Type 2 diabetes mellitus with other specified complication: Secondary | ICD-10-CM

## 2024-03-14 DIAGNOSIS — Z7984 Long term (current) use of oral hypoglycemic drugs: Secondary | ICD-10-CM

## 2024-03-14 MED ORDER — ONETOUCH VERIO VI STRP: ORAL_STRIP | 3 refills | Status: DC

## 2024-03-14 NOTE — Progress Notes (Signed)
 03/14/2024 Name: Barbara Santos MRN: [id number] DOB: Aug 26, 1935  Chief Complaint  Patient presents with   Medication Assistance   Medication Management    Barbara Santos is a 88 y.o. year old female who presented for a telephone visit.   They were referred to the pharmacist by their PCP for assistance in managing diabetes and medication access.      Subjective:   Care Team: Primary Care Provider: Edman Marsa PARAS, DO ; Next Scheduled Visit: 04/10/2024  Medication Access/Adherence  Current Pharmacy:  [id number] [place], [place] - [address] [place] [address] Phone: [phone] Fax: [phone]  [facility] - [place], [place] - [facility] AT Portal to Registered [address] [place] [address] Phone: [phone] Fax: [phone]  [id number] [place], [place] - [address] [place] [address] Phone: [phone] Fax: [phone]   Patient reports affordability concerns with their medications: Yes  Patient reports access/transportation concerns to their pharmacy: No  Patient reports adherence concerns with their medications:  No     Today reports that she has not yet received a letter in the mail from Social Security regarding Extra Help subsidy application submitted on 02/01/2024   Diabetes:   Current medications: metformin  ER 500 mg - 2 tablets (1000 mg) twice daily   Patient uses One Touch Verio Reflect meter, but has been unable to check her blood sugar recently as she has run out of testing supplies. Reports [facility], but was advised that she does not have an active prescription for these - From review of chart, note on 02/14/2024, office received contact from [employer] now covering True Metrix meter/supplies and requesting new prescriptions ([facility])   Patient denies hypoglycemic s/sx including dizziness, shakiness, sweating.    Patient interested in starting Januvia, if affordable, to aid with blood sugar control   Current physical activity: walks throughout the day (uses cane). Has walker to use when needed   Objective:  Lab Results  Component Value Date   HGBA1C 7.7 (A) 01/14/2024    Lab Results  Component Value Date   CREATININE 0.57 (L) 03/30/2023   BUN 14 03/30/2023   NA 132 (L) 03/30/2023   K 5.1 03/30/2023   CL 95 (L) 03/30/2023   CO2 29 03/30/2023    Lab Results  Component Value Date   CHOL 138 03/30/2023   HDL 71 03/30/2023   LDLCALC 53 03/30/2023   TRIG 61 03/30/2023   CHOLHDL 1.9 03/30/2023    Medications Reviewed Today     Reviewed by Alana Sharyle LABOR, RPH-CPP (Pharmacist) on 03/14/24 at 0935  Med List Status: <None>   Medication Order Taking? Sig Documenting Provider Last Dose Status Informant    Discontinued 03/14/24 0935   Cholecalciferol  (VITAMIN D) 2000 UNITS tablet [id number]  Take 2,000 Units by mouth daily. [provider]  Active   diphenhydrAMINE (BENADRYL) 25 MG tablet [id number]  Take 25 mg by mouth every 6 (six) hours as needed.  Patient not taking: Reported on 01/14/2024   [provider]  Active   dorzolamide -timolol  (COSOPT ) 22.3-6.8 MG/ML ophthalmic solution [id number]  Place 1 drop into both eyes 2 (two) times daily.  [provider]  Active            Med Note PECOLA, [place] S  Thu Oct 21, 2015  8:48 AM) Received from: External Pharmacy Received Sig: INSTILL ONE DROP IN OU BID  glucose blood (ONETOUCH VERIO) test strip [id number] Yes Use to check blood sugar once daily Edman Marsa PARAS, DO  Active   ketoconazole  (NIZORAL ) 2 % cream [id number]  APPLY 1 APPLICATION TOPICALLY AT BEDTIME. APPLY TO FEET Hester Alm BROCKS, MD  Active   lactose free nutrition (BOOST) LIQD [id number]  Take 237 mLs by mouth 3 (three) times daily between meals.  [provider]  Active            Med Note (HILL, TIFFANY A   Tue Oct 24, 2016 10:06 AM) Drinks 1 a day  levothyroxine  (SYNTHROID ) 50 MCG tablet [id number]  Take 1 tablet (50 mcg total) by mouth daily before breakfast. Edman Marsa PARAS, DO  Active   metFORMIN  (GLUCOPHAGE -XR) 500 MG 24 hr tablet [id number] Yes Take 2 tablets (1,000 mg total) by mouth 2 (two) times daily with a meal. Edman, Marsa PARAS, DO  Active   Multiple Vitamin (MULTIVITAMIN) tablet [id number]  Take 1 tablet by mouth daily. [provider]  Active   Nutritional Supplements (JUICE PLUS FIBRE PO) [id number]  Take by mouth. [provider]  Active   [facility] LANCETS FINE MISC [id number]  1 each by Other route 3 (three) times daily. [provider]  Active            Med Note PECOLA, GRAYCE RAMAN   Thu Oct 21, 2015  8:48 AM) Received from: External Pharmacy Received Sig: TEST ONCE A DAY AS DIRECTED.  polyethylene glycol powder (GLYCOLAX /MIRALAX ) 17 GM/SCOOP powder [id number]  Take 17-34 g by mouth daily as needed for moderate constipation. May increase up to 3 to 4 capfuls if needed short term for constipation. Edman Marsa PARAS, DO  Active   rosuvastatin  (CRESTOR ) 5 MG tablet [id number]  Take 1 tablet (5 mg total) by mouth at bedtime. Edman Marsa PARAS, DO  Active     Discontinued 03/14/24 0924   valsartan  (DIOVAN ) 40 MG tablet [id number]  Take 1.5 tablets (60 mg total) by mouth daily. Edman Marsa PARAS, DO  Active               Assessment/Plan:   Collaborate with CPhT Inocente Butcher to run test claims for diabetes testing supplies for patient. Find that True Metrix meter/supplies are not currently covered through patient's insurance plan, rather OneTouch Verio remains covered - Send prescription for [employer] [facility] and find that prescription is too soon through her insurance, eligible  for refill next week (last filled 12/19/2023 for 90 day supply - 100 test strips) Notify patient. States that she will call next week to have this filled - Will plan to check again in 2026 calendar year to determine if coverage for testing supplies has changed  Diabetes: - Have reviewed long term cardiovascular and renal outcomes of uncontrolled blood sugar. - Have reviewed dietary modifications including importance of having regular well-balanced meals and snacks throughout the day, while controlling carbohydrate portion sizes - Have discuss with patient diabetes medication management options. Patient interested in starting DPP4-inhibitor, such as Januvia, for tighter blood sugar control Patient to continue to watch the mail for response from Social Security for answer regarding her Extra Help subsidy application Remind that even if she is denied, to retain denial letter as this will be needed for applying for manufacturer patient assistance program.   -  Recommend to continue to monitor home blood sugar, keep log of results and have this record to review at upcoming medical appointments. Patient to contact provider office sooner if needed for readings outside of established parameters or symptoms     Follow Up Plan: Clinical Pharmacist will follow up with patient by telephone on 04/11/2024 at 9:00 AM    Sharyle Sia, PharmD, JAQUELINE, CPP Clinical Pharmacist [facility] [phone]

## 2024-03-14 NOTE — Patient Instructions (Signed)
 Goals Addressed             This Visit's Progress    Pharmacy Goals       The goal A1c is less than 7%. This is the best way to reduce the risk of the long term complications of diabetes, including heart disease, kidney disease, eye disease, strokes, and nerve damage. An A1c of less than 7% corresponds with fasting sugars less than 130 and 2 hour after meal sugars less than 180.   Our goal bad cholesterol, or LDL, is less than 70 . This is why it is important to continue taking your rosuvastatin .  Check your blood pressure once weekly, and any time you have concerning symptoms like headache, chest pain, dizziness, shortness of breath, or vision changes.   To appropriately check your blood pressure, make sure you do the following:  1) Avoid caffeine, exercise, or tobacco products for 30 minutes before checking. Empty your bladder. 2) Sit with your back supported in a flat-backed chair. Rest your arm on something flat (arm of the chair, table, etc). 3) Sit still with your feet flat on the floor, resting, for at least 5 minutes.  4) Check your blood pressure. Take 1-2 readings.  5) Write down these readings and bring with you to any provider appointments.  Bring your home blood pressure machine with you to a provider's office for accuracy comparison at least once a year.   Make sure you take your blood pressure medications before you come to any office visit, even if you were asked to fast for labs.  Sharyle Sia, PharmD, [facility] [phone]

## 2024-03-18 ENCOUNTER — Encounter: Payer: Self-pay | Admitting: *Deleted

## 2024-03-18 ENCOUNTER — Emergency Department

## 2024-03-18 ENCOUNTER — Inpatient Hospital Stay
Admission: EM | Admit: 2024-03-18 | Discharge: 2024-03-24 | DRG: 643 | Disposition: A | Discharge status: Skilled Nursing Facility | Attending: Osteopathic Medicine | Admitting: Osteopathic Medicine

## 2024-03-18 ENCOUNTER — Ambulatory Visit (INDEPENDENT_AMBULATORY_CARE_PROVIDER_SITE_OTHER): Admitting: Family Medicine

## 2024-03-18 ENCOUNTER — Encounter: Payer: Self-pay | Admitting: Family Medicine

## 2024-03-18 ENCOUNTER — Other Ambulatory Visit: Payer: Self-pay

## 2024-03-18 VITALS — BP 124/80 | HR 74 | Ht 61.0 in | Wt 86.1 lb

## 2024-03-18 DIAGNOSIS — Z90710 Acquired absence of both cervix and uterus: Secondary | ICD-10-CM

## 2024-03-18 DIAGNOSIS — E43 Unspecified severe protein-calorie malnutrition: Secondary | ICD-10-CM | POA: Diagnosis present

## 2024-03-18 DIAGNOSIS — Z85828 Personal history of other malignant neoplasm of skin: Secondary | ICD-10-CM

## 2024-03-18 DIAGNOSIS — Z880 Allergy status to penicillin: Secondary | ICD-10-CM

## 2024-03-18 DIAGNOSIS — E1165 Type 2 diabetes mellitus with hyperglycemia: Secondary | ICD-10-CM | POA: Diagnosis present

## 2024-03-18 DIAGNOSIS — R296 Repeated falls: Secondary | ICD-10-CM | POA: Diagnosis present

## 2024-03-18 DIAGNOSIS — G250 Essential tremor: Secondary | ICD-10-CM | POA: Diagnosis present

## 2024-03-18 DIAGNOSIS — E222 Syndrome of inappropriate secretion of antidiuretic hormone: Principal | ICD-10-CM | POA: Diagnosis present

## 2024-03-18 DIAGNOSIS — M6281 Muscle weakness (generalized): Secondary | ICD-10-CM | POA: Diagnosis not present

## 2024-03-18 DIAGNOSIS — E878 Other disorders of electrolyte and fluid balance, not elsewhere classified: Secondary | ICD-10-CM | POA: Diagnosis present

## 2024-03-18 DIAGNOSIS — Z66 Do not resuscitate: Secondary | ICD-10-CM | POA: Diagnosis present

## 2024-03-18 DIAGNOSIS — Z79890 Hormone replacement therapy (postmenopausal): Secondary | ICD-10-CM

## 2024-03-18 DIAGNOSIS — K8020 Calculus of gallbladder without cholecystitis without obstruction: Secondary | ICD-10-CM | POA: Diagnosis present

## 2024-03-18 DIAGNOSIS — Z7982 Long term (current) use of aspirin: Secondary | ICD-10-CM

## 2024-03-18 DIAGNOSIS — Z043 Encounter for examination and observation following other accident: Secondary | ICD-10-CM | POA: Diagnosis not present

## 2024-03-18 DIAGNOSIS — Z8249 Family history of ischemic heart disease and other diseases of the circulatory system: Secondary | ICD-10-CM

## 2024-03-18 DIAGNOSIS — M6259 Muscle wasting and atrophy, not elsewhere classified, multiple sites: Secondary | ICD-10-CM | POA: Diagnosis not present

## 2024-03-18 DIAGNOSIS — K5900 Constipation, unspecified: Secondary | ICD-10-CM | POA: Diagnosis present

## 2024-03-18 DIAGNOSIS — Z823 Family history of stroke: Secondary | ICD-10-CM

## 2024-03-18 DIAGNOSIS — J439 Emphysema, unspecified: Secondary | ICD-10-CM | POA: Diagnosis present

## 2024-03-18 DIAGNOSIS — S51012A Laceration without foreign body of left elbow, initial encounter: Secondary | ICD-10-CM | POA: Diagnosis present

## 2024-03-18 DIAGNOSIS — Z794 Long term (current) use of insulin: Secondary | ICD-10-CM

## 2024-03-18 DIAGNOSIS — E871 Hypo-osmolality and hyponatremia: Secondary | ICD-10-CM | POA: Diagnosis present

## 2024-03-18 DIAGNOSIS — D508 Other iron deficiency anemias: Secondary | ICD-10-CM | POA: Diagnosis present

## 2024-03-18 DIAGNOSIS — Z23 Encounter for immunization: Secondary | ICD-10-CM | POA: Diagnosis not present

## 2024-03-18 DIAGNOSIS — Z681 Body mass index (BMI) 19 or less, adult: Secondary | ICD-10-CM

## 2024-03-18 DIAGNOSIS — W19XXXA Unspecified fall, initial encounter: Principal | ICD-10-CM | POA: Diagnosis present

## 2024-03-18 DIAGNOSIS — Z803 Family history of malignant neoplasm of breast: Secondary | ICD-10-CM

## 2024-03-18 DIAGNOSIS — E440 Moderate protein-calorie malnutrition: Secondary | ICD-10-CM

## 2024-03-18 DIAGNOSIS — Z7984 Long term (current) use of oral hypoglycemic drugs: Secondary | ICD-10-CM

## 2024-03-18 DIAGNOSIS — E039 Hypothyroidism, unspecified: Secondary | ICD-10-CM | POA: Diagnosis present

## 2024-03-18 DIAGNOSIS — I10 Essential (primary) hypertension: Secondary | ICD-10-CM | POA: Diagnosis present

## 2024-03-18 DIAGNOSIS — E785 Hyperlipidemia, unspecified: Secondary | ICD-10-CM | POA: Diagnosis present

## 2024-03-18 DIAGNOSIS — Z833 Family history of diabetes mellitus: Secondary | ICD-10-CM

## 2024-03-18 LAB — CBC WITH DIFFERENTIAL/PLATELET
Abs Immature Granulocytes: 0.04 K/uL (ref 0.00–0.07)
Basophils Absolute: 0 K/uL (ref 0.0–0.1)
Basophils Relative: 0 %
Eosinophils Absolute: 0.1 K/uL (ref 0.0–0.5)
Eosinophils Relative: 1 %
HCT: 33.1 % — ABNORMAL LOW (ref 36.0–46.0)
Hemoglobin: 11.5 g/dL — ABNORMAL LOW (ref 12.0–15.0)
Immature Granulocytes: 1 %
Lymphocytes Relative: 24 %
Lymphs Abs: 2 K/uL (ref 0.7–4.0)
MCH: 31.1 pg (ref 26.0–34.0)
MCHC: 34.7 g/dL (ref 30.0–36.0)
MCV: 89.5 fL (ref 80.0–100.0)
Monocytes Absolute: 0.9 K/uL (ref 0.1–1.0)
Monocytes Relative: 10 %
Neutro Abs: 5.4 K/uL (ref 1.7–7.7)
Neutrophils Relative %: 64 %
Platelets: 244 K/uL (ref 150–400)
RBC: 3.7 MIL/uL — ABNORMAL LOW (ref 3.87–5.11)
RDW: 12.5 % (ref 11.5–15.5)
WBC: 8.3 K/uL (ref 4.0–10.5)
nRBC: 0 % (ref 0.0–0.2)

## 2024-03-18 LAB — URINALYSIS, ROUTINE W REFLEX MICROSCOPIC
Bacteria, UA: NONE SEEN
Bilirubin Urine: NEGATIVE
Glucose, UA: 150 mg/dL — AB
Hgb urine dipstick: NEGATIVE
Ketones, ur: NEGATIVE mg/dL
Nitrite: NEGATIVE
Protein, ur: NEGATIVE mg/dL
Specific Gravity, Urine: 1.014 (ref 1.005–1.030)
pH: 7 (ref 5.0–8.0)

## 2024-03-18 LAB — COMPREHENSIVE METABOLIC PANEL WITH GFR
ALT: 20 U/L (ref 0–44)
AST: 26 U/L (ref 15–41)
Albumin: 4.4 g/dL (ref 3.5–5.0)
Alkaline Phosphatase: 73 U/L (ref 38–126)
Anion gap: 8 (ref 5–15)
BUN: 16 mg/dL (ref 8–23)
CO2: 27 mmol/L (ref 22–32)
Calcium: 9.6 mg/dL (ref 8.9–10.3)
Chloride: 89 mmol/L — ABNORMAL LOW (ref 98–111)
Creatinine, Ser: 0.45 mg/dL (ref 0.44–1.00)
GFR, Estimated: >60 mL/min (ref >60)
Glucose, Bld: 180 mg/dL — ABNORMAL HIGH (ref 70–99)
Potassium: 4.1 mmol/L (ref 3.5–5.1)
Sodium: 125 mmol/L — ABNORMAL LOW (ref 135–145)
Total Bilirubin: 0.4 mg/dL (ref 0.0–1.2)
Total Protein: 6.6 g/dL (ref 6.5–8.1)

## 2024-03-18 LAB — OSMOLALITY: Osmolality: 272 mosm/kg — ABNORMAL LOW (ref 275–295)

## 2024-03-18 LAB — OSMOLALITY, URINE: Osmolality, Ur: 553 mosm/kg (ref 300–900)

## 2024-03-18 MED ORDER — LIDOCAINE HCL (PF) 1 % IJ SOLN
5.0000 mL | Freq: Once | INTRAMUSCULAR | Status: DC
  Filled 2024-03-18: qty 5

## 2024-03-18 MED ORDER — SODIUM CHLORIDE 0.9% FLUSH
3.0000 mL | Freq: Two times a day (BID) | INTRAVENOUS | Status: DC
  Administered 2024-03-18 – 2024-03-24 (×12): 3 mL via INTRAVENOUS

## 2024-03-18 MED ORDER — SODIUM CHLORIDE 0.9 % IV BOLUS
500.0000 mL | Freq: Once | INTRAVENOUS | Status: AC
  Administered 2024-03-18: 500 mL via INTRAVENOUS

## 2024-03-18 MED ORDER — SENNOSIDES-DOCUSATE SODIUM 8.6-50 MG PO TABS: 1.0000 | ORAL_TABLET | Freq: Every evening | ORAL | Status: DC | PRN

## 2024-03-18 MED ORDER — ACETAMINOPHEN 325 MG PO TABS: 650.0000 mg | ORAL_TABLET | Freq: Four times a day (QID) | ORAL | Status: DC | PRN

## 2024-03-18 MED ORDER — LIDOCAINE-EPINEPHRINE (PF) 2 %-1:200000 IJ SOLN
20.0000 mL | Freq: Once | INTRAMUSCULAR | Status: AC
  Administered 2024-03-18: 20 mL via INTRADERMAL
  Filled 2024-03-18: qty 20

## 2024-03-18 MED ORDER — ENOXAPARIN SODIUM 30 MG/0.3ML IJ SOSY: 30.0000 mg | PREFILLED_SYRINGE | INTRAMUSCULAR | Status: DC

## 2024-03-18 MED ORDER — SODIUM CHLORIDE 0.9 % IV BOLUS: 1000.0000 mL | Freq: Once | INTRAVENOUS | Status: DC

## 2024-03-18 MED ORDER — ACETAMINOPHEN 650 MG RE SUPP: 650.0000 mg | Freq: Four times a day (QID) | RECTAL | Status: DC | PRN

## 2024-03-18 MED ORDER — PRIMIDONE 50 MG PO TABS: 25.0000 mg | ORAL_TABLET | Freq: Every day | ORAL | 0 refills | Status: DC

## 2024-03-18 NOTE — H&P (Incomplete)
 History and Physical    Barbara Santos FMW:[id number] DOB: [birth year] DOA: 03/18/2024  DOS: the patient was seen and examined on 03/18/2024  PCP: Edman Marsa PARAS, DO   Patient coming from: Home  I have personally briefly reviewed patient's old medical records in [facility] and CareEverywhere  HPI:   Barbara Santos is a 88 y.o. year old female with medical history of HTN, HLD, T2DM presenting to the ED after a fall.   Pt states   On arrival to the ED patient was noted to be HDS stable. Lab work and imaging obtained.  CBC shows mild anemia that is normocytic but no leukocytosis.  CMP shows moderate hyponatremia at 125, hypochloremia at 89 and mild hyperglycemia but otherwise normal renal function normal liver function.  UA without signs of infection.  CT head without any acute findings and CT cervical spine without any acute findings, plain radiography of the left knee without any acute findings.  Patient with laceration of the left elbow and imaging of the head is negative.  The laceration was repaired but given her recurrent fall and moderate hyponatremia, TRH contacted for admission.  Review of Systems: As mentioned in the history of present illness. All other systems reviewed and are negative.   Past Medical History:  Diagnosis Date   Arthritis    Basal cell carcinoma 08/23/2009   Right dorsum lat. distal forearm.    Basal cell carcinoma 06/29/2021   R forehead - tx with ED&C   Diabetes mellitus, type 2 (HCC)    Dysplastic nevus 08/03/2008   Right ant. thigh, just above knee. Moderate to marked atypia, extends to base. Excised 09/23/2008, margins free.   Dysplastic nevus 08/23/2009   Right post. sup. thigh. MIld atypia, limited margins free.   Glaucoma    Hx of basal cell carcinoma    R cheek   Hx of squamous cell carcinoma of skin 11/01/2009   L medial mid calf   Hyperlipidemia    Hypothyroidism    Insomnia    Left hip pain    Protein calorie  malnutrition    Squamous cell carcinoma of skin 08/23/2009   Left medial mid calf. SCCis    Past Surgical History:  Procedure Laterality Date   CATARACT EXTRACTION W/PHACO Left 12/31/2019   Procedure: CATARACT EXTRACTION PHACO AND INTRAOCULAR LENS PLACEMENT (IOC) LEFT DIABETIC 10.20  01:17.6  13.1%;  Surgeon: Mittie Gaskin, MD;  Location: [facility];  Service: Ophthalmology;  Laterality: Left;  Diabetic - oral meds   CATARACT EXTRACTION W/PHACO Right 01/28/2020   Procedure: CATARACT EXTRACTION PHACO AND INTRAOCULAR LENS PLACEMENT (IOC) RIGHT DIABETIC 13.67 01:34.5 14.5%;  Surgeon: Mittie Gaskin, MD;  Location[facility];  Service: Ophthalmology;  Laterality: Right;   COLONOSCOPY WITH PROPOFOL  N/A 12/31/2017   Procedure: COLONOSCOPY WITH PROPOFOL ;  Surgeon: Viktoria Lamar DASEN, MD;  Location: [facility];  Service: Endoscopy;  Laterality: N/A;   HERNIA REPAIR     TOTAL ABDOMINAL HYSTERECTOMY  2009     Allergies  Allergen Reactions   Amoxicillin Rash   Penicillins Rash    Family History  Problem Relation Age of Onset   Diabetes Mellitus II Mother    Stroke Father    Breast cancer Sister 41   Breast cancer Sister    Stroke Maternal Grandmother    Heart disease Paternal Grandfather     Prior to Admission medications   Medication Sig Start Date End Date Taking? Authorizing Provider  Cholecalciferol  (VITAMIN D)  2000 UNITS tablet Take 2,000 Units by mouth daily.    [provider]  diphenhydrAMINE (BENADRYL) 25 MG tablet Take 25 mg by mouth every 6 (six) hours as needed. Patient not taking: Reported on 03/18/2024    [provider]  dorzolamide -timolol  (COSOPT ) 22.3-6.8 MG/ML ophthalmic solution Place 1 drop into both eyes 2 (two) times daily.  08/09/15   [provider]  glucose blood (ONETOUCH VERIO) test strip Use to check blood sugar once daily 03/14/24   Karamalegos, Marsa PARAS, DO  ketoconazole  (NIZORAL ) 2 % cream APPLY 1  APPLICATION TOPICALLY AT BEDTIME. APPLY TO FEET 09/24/23 09/13/25  Hester Alm BROCKS, MD  lactose free nutrition (BOOST) LIQD Take 237 mLs by mouth 3 (three) times daily between meals.    [provider]  levothyroxine  (SYNTHROID ) 50 MCG tablet Take 1 tablet (50 mcg total) by mouth daily before breakfast. 10/09/23   Edman, Marsa PARAS, DO  metFORMIN  (GLUCOPHAGE -XR) 500 MG 24 hr tablet Take 2 tablets (1,000 mg total) by mouth 2 (two) times daily with a meal. 01/14/24   Karamalegos, Marsa PARAS, DO  Multiple Vitamin (MULTIVITAMIN) tablet Take 1 tablet by mouth daily.    [provider]  Nutritional Supplements (JUICE PLUS FIBRE PO) Take by mouth.    [provider]  [facility]ICA LANCETS FINE MISC 1 each by Other route 3 (three) times daily. 09/17/15   [provider]  polyethylene glycol powder (GLYCOLAX /MIRALAX ) 17 GM/SCOOP powder Take 17-34 g by mouth daily as needed for moderate constipation. May increase up to 3 to 4 capfuls if needed short term for constipation. 04/06/23   Karamalegos, Marsa PARAS, DO  primidone  (MYSOLINE ) 50 MG tablet Take 0.5-1 tablets (25-50 mg total) by mouth at bedtime. For tremors 03/18/24   Edman Marsa PARAS, DO  rosuvastatin  (CRESTOR ) 5 MG tablet Take 1 tablet (5 mg total) by mouth at bedtime. 10/09/23   Karamalegos, Marsa PARAS, DO  valsartan  (DIOVAN ) 40 MG tablet Take 1.5 tablets (60 mg total) by mouth daily. 10/09/23   Edman Marsa PARAS, DO    Social History:  reports that she has never smoked. She has never used smokeless tobacco. She reports that she does not drink alcohol and does not use drugs. Lives with Tobacco- *** ppd x **** years since age/ Denies use. EtOH- *** shots/ week, *** beers/week. Last drink was ***. Denies use.  Illicit drug use- denies use.  IADLs/ADLs- can perform independently at baseline    Physical Exam: Vitals:   03/18/24 1745  BP: (!) 188/92  Pulse: 84  Resp: 18  Temp: 98.2 F  (36.8 C)  TempSrc: Oral  SpO2: 100%  Weight: 39 kg  Height: 5' 1 (1.549 m)     Gen: HENT: CV: Resp: Abd: MSK: Skin: Neuro: Psych:   Labs on Admission: I have personally reviewed following labs and imaging studies  CBC: Recent Labs  Lab 03/18/24 1842  WBC 8.3  NEUTROABS 5.4  HGB 11.5*  HCT 33.1*  MCV 89.5  PLT 244   Basic Metabolic Panel: Recent Labs  Lab 03/18/24 1842  NA 125*  K 4.1  CL 89*  CO2 27  GLUCOSE 180*  BUN 16  CREATININE 0.45  CALCIUM  9.6   GFR: Estimated Creatinine Clearance: 31.1 mL/min (by C-G formula based on SCr of 0.45 mg/dL). Liver Function Tests: Recent Labs  Lab 03/18/24 1842  AST 26  ALT 20  ALKPHOS 73  BILITOT 0.4  PROT 6.6  ALBUMIN 4.4   No results  for input(s): LIPASE, AMYLASE in the last 168 hours. No results for input(s): AMMONIA in the last 168 hours. Coagulation Profile: No results for input(s): INR, PROTIME in the last 168 hours. Cardiac Enzymes: No results for input(s): CKTOTAL, CKMB, CKMBINDEX, TROPONINI, TROPONINIHS in the last 168 hours. BNP (last 3 results) No results for input(s): BNP in the last 8760 hours. HbA1C: No results for input(s): HGBA1C in the last 72 hours. CBG: No results for input(s): GLUCAP in the last 168 hours. Lipid Profile: No results for input(s): CHOL, HDL, LDLCALC, TRIG, CHOLHDL, LDLDIRECT in the last 72 hours. Thyroid  Function Tests: No results for input(s): TSH, T4TOTAL, FREET4, T3FREE, THYROIDAB in the last 72 hours. Anemia Panel: No results for input(s): VITAMINB12, FOLATE, FERRITIN, TIBC, IRON, RETICCTPCT in the last 72 hours. Urine analysis:    Component Value Date/Time   COLORURINE YELLOW (A) 03/18/2024 1922   APPEARANCEUR CLEAR (A) 03/18/2024 1922   LABSPEC 1.014 03/18/2024 1922   PHURINE 7.0 03/18/2024 1922   GLUCOSEU 150 (A) 03/18/2024 1922   HGBUR NEGATIVE 03/18/2024 1922   BILIRUBINUR NEGATIVE 03/18/2024  1922   BILIRUBINUR Negative 01/07/2020 0933   KETONESUR NEGATIVE 03/18/2024 1922   PROTEINUR NEGATIVE 03/18/2024 1922   UROBILINOGEN 0.2 01/07/2020 0933   NITRITE NEGATIVE 03/18/2024 1922   LEUKOCYTESUR TRACE (A) 03/18/2024 1922    Radiological Exams on Admission: I have personally reviewed images CT Cervical Spine Wo Contrast Result Date: 03/18/2024 EXAM: CT CERVICAL SPINE WITHOUT CONTRAST 03/18/2024 07:00:54 PM TECHNIQUE: CT of the cervical spine was performed without the administration of intravenous contrast. Multiplanar reformatted images are provided for review. Automated exposure control, iterative reconstruction, and/or weight based adjustment of the mA/kV was utilized to reduce the radiation dose to as low as reasonably achievable. COMPARISON: None available. CLINICAL HISTORY: Neck trauma (Age >= 65y) FINDINGS: CERVICAL SPINE: BONES AND ALIGNMENT: Straightening of the normal cervical lordosis. No evidence of traumatic malalignment. No acute fracture. DEGENERATIVE CHANGES: Disc space narrowing most pronounced at C4-C5, C6-C7, and C7-T1. Degenerative endplate osteophytes at multiple levels. There is no high grade osseous spinal canal stenosis. Facet arthrosis and no facet hypertrophy at multiple levels. Foraminal stenosis most pronounced at C5-C6. SOFT TISSUES: No prevertebral soft tissue swelling. LUNGS: Biapical pleural parenchymal scarring. IMPRESSION: 1. No acute abnormality of the cervical spine related to the reported neck trauma. 2. Degenerative changes as above. Electronically signed by: Donnice Mania MD 03/18/2024 07:31 PM EST RP Workstation: [id number]   CT Head Wo Contrast Result Date: 03/18/2024 EXAM: CT HEAD WITHOUT 03/18/2024 07:00:54 PM TECHNIQUE: CT of the head was performed without the administration of intravenous contrast. Automated exposure control, iterative reconstruction, and/or weight based adjustment of the mA/kV was utilized to reduce the radiation dose to as low as  reasonably achievable. COMPARISON: None available. CLINICAL HISTORY: Head trauma, minor (Age >= 65y) FINDINGS: BRAIN AND VENTRICLES: Age related atrophy. No acute intracranial hemorrhage. No mass effect or midline shift. No extra-axial fluid collection. No evidence of acute infarct. No hydrocephalus. ORBITS: Bilateral cataract resection. No acute abnormality. SINUSES AND MASTOIDS: No acute abnormality. SOFT TISSUES AND SKULL: Atherosclerosis of skullbase vasculature without hyperdense vessel or abnormal calcification. No acute skull fracture. No acute soft tissue abnormality. IMPRESSION: 1. No acute intracranial abnormality related to head trauma. Electronically signed by: Donnice Mania MD 03/18/2024 07:26 PM EST RP Workstation: [id number]   DG Knee Complete 4 Views Left Result Date: 03/18/2024 EXAM: 4 OR MORE VIEW(S) Xray of the Left Knee 03/18/2024 06:05:29 PM COMPARISON: None available. CLINICAL HISTORY:  fall fall FINDINGS: BONES AND JOINTS: No acute fracture. No focal osseous lesion. No joint dislocation. No significant joint effusion. No significant degenerative changes. SOFT TISSUES: The soft tissues are unremarkable. IMPRESSION: 1. No significant abnormality. Electronically signed by: Elsie Gravely MD 03/18/2024 06:26 PM EST RP Workstation: [id number]   DG Elbow Complete Left Result Date: 03/18/2024 EXAM: 3 VIEW(S) XRAY OF THE LEFT ELBOW COMPARISON: None available. CLINICAL HISTORY: Fall Fall FINDINGS: BONES AND JOINTS: No acute fracture. No focal osseous lesion. No joint dislocation. No joint effusion. SOFT TISSUES: The soft tissues are unremarkable. IMPRESSION: 1. No acute abnormality. Electronically signed by: Elsie Gravely MD 03/18/2024 06:25 PM EST RP Workstation: [id number]    EKG: My personal interpretation of EKG shows: Pending    Assessment/Plan Active Problems:   * No active hospital problems. *     VTE prophylaxis:  Lovenox   Diet: Code Status:  {Blank single:19197::Full  Code,DNR with Intubation,DNR/DNI(Do NOT Intubate),Comfort Care,***} Telemetry:  Admission status: Inpatient, Telemetry bed Patient is from: Home Anticipated d/c is to: Home Anticipated d/c is in: 2-3 days   Family Communication: Updated at bedside  Consults called: None   Severity of Illness: {Observation/Inpatient:21159}   Barbara Bathe, MD Barbara Santos. [facility]

## 2024-03-18 NOTE — ED Notes (Signed)
 Pt lac bleeding through dressings placed in triage. ABD and Coban placed pressure dressing style after Marolyn, PA visualized wound. Will monitor for bleeding.

## 2024-03-18 NOTE — Progress Notes (Signed)
 Subjective:    Patient ID: Barbara Santos, female    DOB: Nov 23, 1935, 88 y.o.   MRN: [id number]  Barbara Santos is a 88 y.o. female presenting on 03/18/2024 for Extremity Weakness and Tremors  Patient presents for a same day appointment.   HPI  Discussed the use of AI scribe software for clinical note transcription with the patient, who gave verbal consent to proceed.  History of Present Illness   Barbara Santos is an 88 year old female who presents with sudden onset muscle weakness and tremors.  Generalized Muscle weakness Muscle Atrophy Chronic problem. But recent flare with sudden onset muscle weakness, predominantly affecting both legs equally - No focal symptoms isolated to one side or persistent weakness - Difficulty standing and sliding off the bed onto the floor - Able to get back on her knees and eventually stand up after some time - No associated dizziness, lightheadedness, or shortness of breath - No prior episodes of similar muscle weakness  Tremors and fine motor impairment - Progressive worsening of hand tremors over the past week - Difficulty with fine motor tasks, such as buttoning blouse and tying shoes - Tremors primarily noticeable during activities requiring fine motor skills - Tremors absent at rest - No prior history of tremors before this recent onset  Type 2 Diabetes Glycemic control - History of elevated blood glucose levels - Recent episode of unusually high blood sugar, unable to confirm with test strips - Awaiting medication assistance for diabetes management from Social Security       Health Maintenance: Prevnar 20 today     01/31/2024    2:14 PM 10/09/2023   10:29 AM 04/06/2023    9:52 AM  Depression screen PHQ 2/9  Decreased Interest 0 0 0  Down, Depressed, Hopeless 0 0 0  PHQ - 2 Score 0 0 0  Altered sleeping  0 3  Tired, decreased energy  0 0  Change in appetite  0 0  Feeling bad or failure about yourself   0 0   Trouble concentrating  0 0  Moving slowly or fidgety/restless  0 0  Suicidal thoughts  0 0  PHQ-9 Score  0  3   Difficult doing work/chores  Not difficult at all      Data saved with a previous flowsheet row definition       10/09/2023   10:29 AM 04/06/2023    9:52 AM 10/04/2022    8:45 AM 04/04/2022    8:38 AM  GAD 7 : Generalized Anxiety Score  Nervous, Anxious, on Edge 0 0 0 0  Control/stop worrying 0 0 0 0  Worry too much - different things 0 0 0 0  Trouble relaxing 0 0 0 0  Restless 0 0 0 0  Easily annoyed or irritable 0 0 0 0  Afraid - awful might happen 0 0 0 0  Total GAD 7 Score 0 0 0 0  Anxiety Difficulty Not difficult at all  Not difficult at all Not difficult at all    Social History   Tobacco Use   Smoking status: Never   Smokeless tobacco: Never  Vaping Use   Vaping status: Never Used  Substance Use Topics   Alcohol use: No    Alcohol/week: 0.0 standard drinks of alcohol   Drug use: No    Review of Systems Per HPI unless specifically indicated above     Objective:    BP 124/80 (BP Location: Right Arm, Patient  Position: Sitting, Cuff Size: Normal)   Pulse 74   Ht 5' 1 (1.549 m)   Wt 86 lb 2 oz (39.1 kg)   SpO2 94%   BMI 16.27 kg/m   Wt Readings from Last 3 Encounters:  03/18/24 86 lb (39 kg)  03/18/24 86 lb 2 oz (39.1 kg)  01/31/24 82 lb (37.2 kg)    Physical Exam Vitals and nursing note reviewed.  Constitutional:      General: She is not in acute distress.    Appearance: She is well-developed. She is not diaphoretic.     Comments: Well-appearing, comfortable, cooperative  HENT:     Head: Normocephalic and atraumatic.  Eyes:     General:        Right eye: No discharge.        Left eye: No discharge.     Conjunctiva/sclera: Conjunctivae normal.  Neck:     Thyroid : No thyromegaly.  Cardiovascular:     Rate and Rhythm: Normal rate and regular rhythm.     Heart sounds: Normal heart sounds. No murmur heard. Pulmonary:     Effort:  Pulmonary effort is normal. No respiratory distress.     Breath sounds: Normal breath sounds. No wheezing or rales.  Musculoskeletal:        General: Normal range of motion.     Cervical back: Normal range of motion and neck supple.     Comments: Thin appearing muscles with reduced muscle mass bilateral thighs and upper arms  Intact muscle strength bilateral extremities  Lymphadenopathy:     Cervical: No cervical adenopathy.  Skin:    General: Skin is warm and dry.     Findings: No erythema or rash.  Neurological:     General: No focal deficit present.     Mental Status: She is alert and oriented to person, place, and time.     Cranial Nerves: No cranial nerve deficit.     Sensory: No sensory deficit.     Motor: No weakness.     Comments: No tremors at baseline resting or with holding arms up and some tasks today  Psychiatric:        Behavior: Behavior normal.     Comments: Well groomed, good eye contact, normal speech and thoughts     Results for orders placed or performed in visit on 01/14/24  POCT HgB A1C   Collection Time: 01/14/24  3:00 PM  Result Value Ref Range   Hemoglobin A1C 7.7 (A) 4.0 - 5.6 %   HbA1c POC (<> result, manual entry)     HbA1c, POC (prediabetic range)     HbA1c, POC (controlled diabetic range)        Assessment & Plan:   Problem List Items Addressed This Visit     Protein calorie malnutrition   Other Visit Diagnoses       Benign essential tremor    -  Primary   Relevant Medications   primidone  (MYSOLINE ) 50 MG tablet     Generalized muscle weakness         Need for Streptococcus pneumoniae vaccination       Relevant Orders   Pneumococcal conjugate vaccine 20-valent (Completed)     Atrophy of muscle of multiple sites            Benign Essential tremor Intermittent tremor during fine motor tasks, worsening over a week. Not present at rest. Likely due to muscle weakness and age-related neuromuscular changes. No Parkinson's or other  neurological  disorders suspected based on history and exam  Suggested observation. Patient and family would like to try medication first - Prescribed primidone  25 mg at bedtime, option to increase to 50 mg. - Educated on pill cutter use for dose adjustment. - Discussed sedation risk, advised bedtime dosing. - Advised symptom monitoring and reporting changes.  Generalized muscle weakness Muscle Atrophy Protein Calorie Malnutrition Moderate Recent generalized muscle weakness without dizziness or shortness of breath. Likely due to age-related muscle mass loss. No acute neurological event. - Encouraged cane or walker use for stability. - Discussed maintaining muscle strength through regular activity. - Advised on walker need and acquisition options.  General Health Maintenance Due for updated pneumonia vaccination, appropriate for upcoming winter and flu season. - Administered updated pneumonia vaccination.      Additional updates not focus of visit Type 2 Diabetes, working with clinical pharmacy Januvia > awaiting PAP / Extra Help Subsidy pending   Orders Placed This Encounter  Procedures   Pneumococcal conjugate vaccine 20-valent    Meds ordered this encounter  Medications   primidone  (MYSOLINE ) 50 MG tablet    Sig: Take 0.5-1 tablets (25-50 mg total) by mouth at bedtime. For tremors    Dispense:  30 tablet    Refill:  0    Follow up plan: Return if symptoms worsen or fail to improve.   Marsa Officer, DO [facility] 03/18/2024, 8:47 AM

## 2024-03-18 NOTE — ED Notes (Signed)
 Pt ambulatory to bathroom using her cane with this tech at her side. Pt urinated and hand hygiene preformed. Son at bedside. Call light within reach. Pt has no further needs at this time.

## 2024-03-18 NOTE — ED Provider Notes (Signed)
 [facility] Provider Note    Event Date/Time   First MD Initiated Contact with Patient 03/18/24 1809     (approximate)   History   Fall    HPI  Barbara Santos is a 88 y.o. female    with a past medical history of type 2 diabetes, essential tremor, hypothyroidism, with no significant past medical history who presents to the ED complaining of fall. According to the patient, she fell after her legs gave out hitting her left elbow.  Patient denies loss of consciousness, blurry vision, nauseas or vomit.  No witnesses.  Patient endorses she fell this morning at home.  Patient denies fever, chills, nasal congestion, chest pain, abdominal pain, diarrhea or urinary symptoms.  Patient endorses that she is taking metformin  1000 mg every 12 hours.  Patient is taking aspirin.  Patient is here with her son. Per independent chart review last Tdap was 12/03/2013    Patient Active Problem List   Diagnosis Date Noted   Constipation 09/19/2018   Essential hypertension 09/19/2018   Excessive gas 10/31/2016   Protein calorie malnutrition 10/31/2016   Hyperlipidemia 10/31/2016   Type II diabetes mellitus (HCC) 01/05/2015   Hypothyroidism 01/05/2015   Serum calcium  elevated 01/05/2015     Physical Exam   Triage Vital Signs: ED Triage Vitals [03/18/24 1745]  Encounter Vitals Group     BP (!) 188/92     Girls Systolic BP Percentile      Girls Diastolic BP Percentile      Boys Systolic BP Percentile      Boys Diastolic BP Percentile      Pulse Rate 84     Resp 18     Temp 98.2 F (36.8 C)     Temp Source Oral     SpO2 100 %     Weight 86 lb (39 kg)     Height 5' 1 (1.549 m)     Head Circumference      Peak Flow      Pain Score 8     Pain Loc      Pain Education      Exclude from Growth Chart     Most recent vital signs: Vitals:   03/18/24 2054 03/18/24 2200  BP:  124/61  Pulse: 81 68  Resp:    Temp:    SpO2: 98% 98%     Physical Exam Vitals  and nursing note reviewed.  During triage patient was hypertensive.  General:          Awake, no distress. Head: Atraumatic, nontender to palpation, no depressions. Eyes: PERRLA Mouth: No peritonsillar erythema, no tonsillar enlargement. CV:                  Good peripheral perfusion.  No murmurs Resp:               Normal effort. no tachypnea, no wheezing Abd:                 No distention.  Soft nontender.  No signs of peritoneal irritation. Other:      Left elbow: There is presence of laceration in the dorsal area.  6 cm length, 2 mm depth.  Active bleeding.  Full ROM.  Pulses positive.  Lower extremity: Right leg: Presence of edema grade 2/3, cool skin.  No tenderness to palpation.        ED Results / Procedures / Treatments   Labs (all labs ordered  are listed, but only abnormal results are displayed) Labs Reviewed  COMPREHENSIVE METABOLIC PANEL WITH GFR - Abnormal; Notable for the following components:      Result Value   Sodium 125 (*)    Chloride 89 (*)    Glucose, Bld 180 (*)    All other components within normal limits  CBC WITH DIFFERENTIAL/PLATELET - Abnormal; Notable for the following components:   RBC 3.70 (*)    Hemoglobin 11.5 (*)    HCT 33.1 (*)    All other components within normal limits  URINALYSIS, ROUTINE W REFLEX MICROSCOPIC - Abnormal; Notable for the following components:   Color, Urine YELLOW (*)    APPearance CLEAR (*)    Glucose, UA 150 (*)    Leukocytes,Ua TRACE (*)    All other components within normal limits  OSMOLALITY - Abnormal; Notable for the following components:   Osmolality 272 (*)    All other components within normal limits  OSMOLALITY, URINE  SODIUM, URINE, RANDOM     EKG See physician read    RADIOLOGY I independently reviewed and interpreted imaging and agree with radiologists findings.      PROCEDURES:  Critical Care performed:   .Laceration Repair  Date/Time: 03/18/2024 8:26 PM  Performed by: Janit Kast,  PA-C Authorized by: Janit Kast, PA-C   Consent:    Consent obtained:  Verbal   Consent given by:  Patient   Risks, benefits, and alternatives were discussed: yes     Risks discussed:  Pain Universal protocol:    Procedure explained and questions answered to patient or proxy's satisfaction: yes     Patient identity confirmed:  Verbally with patient Anesthesia:    Anesthesia method:  Local infiltration   Local anesthetic:  Lidocaine  2% WITH epi Laceration details:    Location:  Shoulder/arm   Shoulder/arm location:  L elbow   Length (cm):  5   Depth (mm):  1 Pre-procedure details:    Preparation:  Patient was prepped and draped in usual sterile fashion Exploration:    Limited defect created (wound extended): no     Hemostasis achieved with:  Direct pressure and epinephrine    Imaging obtained: x-ray     Imaging outcome: foreign body not noted   Treatment:    Area cleansed with:  Povidone-iodine   Amount of cleaning:  Standard   Irrigation solution:  Sterile saline   Irrigation volume:  300   Irrigation method:  Pressure wash   Visualized foreign bodies/material removed: no     Debridement:  None   Undermining:  None Skin repair:    Repair method:  Sutures   Suture size:  5-0   Suture material:  Nylon   Number of sutures:  6 Approximation:    Approximation:  Close Repair type:    Repair type:  Simple Post-procedure details:    Dressing:  Non-adherent dressing and adhesive bandage   Procedure completion:  Tolerated well, no immediate complications    MEDICATIONS ORDERED IN ED: Medications  lidocaine -EPINEPHrine  (XYLOCAINE  W/EPI) 2 %-1:200000 (PF) injection 20 mL (20 mLs Intradermal Given 03/18/24 1935)  sodium chloride  0.9 % bolus 500 mL (0 mLs Intravenous Stopped 03/18/24 2222)   Clinical Course as of 03/18/24 2306  Tue Mar 18, 2024  1827 DG Knee Complete 4 Views Left  No significant abnormality. [AE]  1828 DG Elbow Complete Left . No acute abnormality.  [AE]  2006 Urinalysis, Routine w reflex microscopic -Urine, Clean Catch(!) Glucose 150, leukocytes trace.  No UTI [  AE]  2006 Comprehensive metabolic panel(!) Hyponatremia, sodium 125.  Renal function liver function, anion gap within normal limits [AE]  2007 CBC with Differential(!) Anemia, hemoglobin 11.5 [AE]  2009 CT Cervical Spine Wo Contrast  No acute abnormality of the cervical spine related to the reported neck trauma. 2. Degenerative changes as above   [AE]  2010 CT Head Wo Contrast  No acute intracranial abnormality related to head trauma. [AE]  2010 DG Knee Complete 4 Views Left  No significant abnormality. [AE]  2010 DG Elbow Complete Left  No acute abnormality [AE]  2023 Updated patient and son about results of the blood work.  Patient is going to be admitted related to her age, sodium levels and multiple falls today.  Patient is agreeable with the plan. [AE]  2038 Consulted hospitalist who is going to admit the patient. [AE]    Clinical Course User Index [AE] Janit Kast, PA-C    IMPRESSION / MDM / ASSESSMENT AND PLAN / ED COURSE  I reviewed the triage vital signs and the nursing notes.  Differential diagnosis includes, but is not limited to, fall, laceration of the elbow, elbow fracture, electrolyte derangement, UTI, medication side effect, hypoglycemia  Patient's presentation is most consistent with acute complicated illness / injury requiring diagnostic workup.   Barbara Santos is a 88 y.o., female who was brought today after falling at home.  Patient denies loss of consciousness, blurry vision, vomit, cephalalgia.  No witnesses.  patient endorses having this morning another fall.  Patient presents with laceration in the left elbow, pain on her left knee.  Patient is taking aspirin.  Patient is taking metformin  1000 mg every 12 hours.  On a physical exam patient was hypertensive during triage.  Patient is oriented x 4.  Presence of laceration on the left  posterior elbow, 6 cm length, 2 mm depth, active bleeding.  Full ROM, pulses positive, sensation intact, but capillary refill 2 seconds.  Right lower extremity, presence of edema 2/3, cold skin, no tenderness to palpation of the calf.  Full ROM.  Pulses positive.  Rest of the physical exam is normal. Plan CBC, CMP UA Waiting for report of x-ray ordered during triage Tdap Laceration repair Reassess After laceration repair, CMP showed hyponatremia 125 that can explain multiple falls today.   Urinalysis ruled out UTI.  CBC showed hemoglobin 11.5.  CT of the head without contrast, and CT of the cervical spine without contrast, DG left knee, DG left elbow within normal limits. Plan:  IV fluid Sodium, urine Osmolality Osmolality urine Consult hospitalist for admission Patient's diagnosis is consistent with fall, laceration left elbow, hyponatremia, anemia.  Based on her age, hyponatremia, anemia, was decided to admit the patient.  Consulted hospitalist, patient is going to be admitted. Discussed plan of care with patient, answered all of patient's questions, Patient agreeable to plan of care. Patient verbalized understanding.  FINAL CLINICAL IMPRESSION(S) / ED DIAGNOSES   Final diagnoses:  Fall, initial encounter  Elbow laceration, left, initial encounter  Hyponatremia  Other iron deficiency anemia     Rx / DC Orders   ED Discharge Orders     None        Note:  This document was prepared using Dragon voice recognition software and may include unintentional dictation errors.   Janit Kast, PA-C 03/18/24 2319    Clarine Ozell LABOR, MD 03/18/24 2352

## 2024-03-18 NOTE — Progress Notes (Signed)
 PHARMACIST - PHYSICIAN COMMUNICATION  CONCERNING:  Enoxaparin  (Lovenox ) for DVT Prophylaxis    RECOMMENDATION: Patient was prescribed enoxaprin 40mg  q24 hours for VTE prophylaxis.   Filed Weights   03/18/24 1745  Weight: 39 kg (86 lb)    Body mass index is 16.25 kg/m.  Estimated Creatinine Clearance: 31.1 mL/min (by C-G formula based on SCr of 0.45 mg/dL).  Patient is candidate for enoxaparin  30mg  every 24 hours based on CrCl <1ml/min or Weight <45kg  DESCRIPTION: Pharmacy has adjusted enoxaparin  dose per [facility] policy.  Patient is now receiving enoxaparin  30 mg every 24 hours   Rankin CANDIE Dills, PharmD, [facility] 03/18/2024 11:23 PM

## 2024-03-18 NOTE — ED Notes (Signed)
 Pt assisted to bathroom w/ stand-by assist. Pt back in bed, son at bedside. Plan to admit. Call light within reach.

## 2024-03-18 NOTE — ED Triage Notes (Signed)
 Pt to triage via wheelchair.  Pt fell outside today.  Pt states legs gave out and pt fell hitting her left elbow on cement.  Pt has a laceration to left elbow.  Bleeding controlled  no loc  no neck or back pain   pt has left knee pain.  pt alert  speech clear.

## 2024-03-18 NOTE — Patient Instructions (Addendum)

## 2024-03-19 DIAGNOSIS — W19XXXA Unspecified fall, initial encounter: Secondary | ICD-10-CM | POA: Diagnosis not present

## 2024-03-19 DIAGNOSIS — Z803 Family history of malignant neoplasm of breast: Secondary | ICD-10-CM | POA: Diagnosis not present

## 2024-03-19 DIAGNOSIS — E871 Hypo-osmolality and hyponatremia: Secondary | ICD-10-CM | POA: Diagnosis not present

## 2024-03-19 DIAGNOSIS — H4010X0 Unspecified open-angle glaucoma, stage unspecified: Secondary | ICD-10-CM | POA: Diagnosis not present

## 2024-03-19 DIAGNOSIS — Z823 Family history of stroke: Secondary | ICD-10-CM | POA: Diagnosis not present

## 2024-03-19 DIAGNOSIS — E559 Vitamin D deficiency, unspecified: Secondary | ICD-10-CM | POA: Diagnosis not present

## 2024-03-19 DIAGNOSIS — Z833 Family history of diabetes mellitus: Secondary | ICD-10-CM | POA: Diagnosis not present

## 2024-03-19 DIAGNOSIS — E46 Unspecified protein-calorie malnutrition: Secondary | ICD-10-CM | POA: Diagnosis not present

## 2024-03-19 DIAGNOSIS — E119 Type 2 diabetes mellitus without complications: Secondary | ICD-10-CM | POA: Diagnosis not present

## 2024-03-19 DIAGNOSIS — E039 Hypothyroidism, unspecified: Secondary | ICD-10-CM | POA: Diagnosis not present

## 2024-03-19 DIAGNOSIS — Z7982 Long term (current) use of aspirin: Secondary | ICD-10-CM | POA: Diagnosis not present

## 2024-03-19 DIAGNOSIS — Z79890 Hormone replacement therapy (postmenopausal): Secondary | ICD-10-CM | POA: Diagnosis not present

## 2024-03-19 DIAGNOSIS — G250 Essential tremor: Secondary | ICD-10-CM | POA: Diagnosis not present

## 2024-03-19 DIAGNOSIS — E785 Hyperlipidemia, unspecified: Secondary | ICD-10-CM | POA: Diagnosis not present

## 2024-03-19 DIAGNOSIS — M6259 Muscle wasting and atrophy, not elsewhere classified, multiple sites: Secondary | ICD-10-CM | POA: Diagnosis not present

## 2024-03-19 DIAGNOSIS — E43 Unspecified severe protein-calorie malnutrition: Secondary | ICD-10-CM | POA: Diagnosis not present

## 2024-03-19 DIAGNOSIS — Z85828 Personal history of other malignant neoplasm of skin: Secondary | ICD-10-CM | POA: Diagnosis not present

## 2024-03-19 DIAGNOSIS — E1165 Type 2 diabetes mellitus with hyperglycemia: Secondary | ICD-10-CM | POA: Diagnosis not present

## 2024-03-19 DIAGNOSIS — R41841 Cognitive communication deficit: Secondary | ICD-10-CM | POA: Diagnosis not present

## 2024-03-19 DIAGNOSIS — Z681 Body mass index (BMI) 19 or less, adult: Secondary | ICD-10-CM | POA: Diagnosis not present

## 2024-03-19 DIAGNOSIS — Z90710 Acquired absence of both cervix and uterus: Secondary | ICD-10-CM | POA: Diagnosis not present

## 2024-03-19 DIAGNOSIS — R296 Repeated falls: Secondary | ICD-10-CM | POA: Diagnosis not present

## 2024-03-19 DIAGNOSIS — Z794 Long term (current) use of insulin: Secondary | ICD-10-CM | POA: Diagnosis not present

## 2024-03-19 DIAGNOSIS — Z66 Do not resuscitate: Secondary | ICD-10-CM | POA: Diagnosis not present

## 2024-03-19 DIAGNOSIS — E222 Syndrome of inappropriate secretion of antidiuretic hormone: Secondary | ICD-10-CM | POA: Diagnosis not present

## 2024-03-19 DIAGNOSIS — D508 Other iron deficiency anemias: Secondary | ICD-10-CM | POA: Diagnosis not present

## 2024-03-19 DIAGNOSIS — E569 Vitamin deficiency, unspecified: Secondary | ICD-10-CM | POA: Diagnosis not present

## 2024-03-19 DIAGNOSIS — G4700 Insomnia, unspecified: Secondary | ICD-10-CM | POA: Diagnosis not present

## 2024-03-19 DIAGNOSIS — J439 Emphysema, unspecified: Secondary | ICD-10-CM | POA: Diagnosis not present

## 2024-03-19 DIAGNOSIS — I10 Essential (primary) hypertension: Secondary | ICD-10-CM | POA: Diagnosis not present

## 2024-03-19 DIAGNOSIS — Z7984 Long term (current) use of oral hypoglycemic drugs: Secondary | ICD-10-CM | POA: Diagnosis not present

## 2024-03-19 DIAGNOSIS — R251 Tremor, unspecified: Secondary | ICD-10-CM | POA: Diagnosis not present

## 2024-03-19 DIAGNOSIS — Z8249 Family history of ischemic heart disease and other diseases of the circulatory system: Secondary | ICD-10-CM | POA: Diagnosis not present

## 2024-03-19 LAB — BASIC METABOLIC PANEL WITH GFR
Anion gap: 9 (ref 5–15)
Anion gap: 11 (ref 5–15)
Anion gap: 10 (ref 5–15)
BUN: 10 mg/dL (ref 8–23)
BUN: 12 mg/dL (ref 8–23)
BUN: 13 mg/dL (ref 8–23)
CO2: 25 mmol/L (ref 22–32)
CO2: 22 mmol/L (ref 22–32)
CO2: 23 mmol/L (ref 22–32)
Calcium: 8.5 mg/dL — ABNORMAL LOW (ref 8.9–10.3)
Calcium: 8.9 mg/dL (ref 8.9–10.3)
Calcium: 8.1 mg/dL — ABNORMAL LOW (ref 8.9–10.3)
Chloride: 92 mmol/L — ABNORMAL LOW (ref 98–111)
Chloride: 87 mmol/L — ABNORMAL LOW (ref 98–111)
Chloride: 92 mmol/L — ABNORMAL LOW (ref 98–111)
Creatinine, Ser: 0.4 mg/dL — ABNORMAL LOW (ref 0.44–1.00)
Creatinine, Ser: 0.46 mg/dL (ref 0.44–1.00)
Creatinine, Ser: 0.45 mg/dL (ref 0.44–1.00)
GFR, Estimated: >60 mL/min (ref >60)
GFR, Estimated: >60 mL/min (ref >60)
GFR, Estimated: >60 mL/min (ref >60)
Glucose, Bld: 173 mg/dL — ABNORMAL HIGH (ref 70–99)
Glucose, Bld: 372 mg/dL — ABNORMAL HIGH (ref 70–99)
Glucose, Bld: 280 mg/dL — ABNORMAL HIGH (ref 70–99)
Potassium: 3.9 mmol/L (ref 3.5–5.1)
Potassium: 4.4 mmol/L (ref 3.5–5.1)
Potassium: 4 mmol/L (ref 3.5–5.1)
Sodium: 126 mmol/L — ABNORMAL LOW (ref 135–145)
Sodium: 120 mmol/L — ABNORMAL LOW (ref 135–145)
Sodium: 125 mmol/L — ABNORMAL LOW (ref 135–145)

## 2024-03-19 LAB — CBC
HCT: 34.4 % — ABNORMAL LOW (ref 36.0–46.0)
Hemoglobin: 12 g/dL (ref 12.0–15.0)
MCH: 31 pg (ref 26.0–34.0)
MCHC: 34.9 g/dL (ref 30.0–36.0)
MCV: 88.9 fL (ref 80.0–100.0)
Platelets: 257 K/uL (ref 150–400)
RBC: 3.87 MIL/uL (ref 3.87–5.11)
RDW: 12.5 % (ref 11.5–15.5)
WBC: 7.8 K/uL (ref 4.0–10.5)
nRBC: 0 % (ref 0.0–0.2)

## 2024-03-19 LAB — GLUCOSE, CAPILLARY: Glucose-Capillary: 317 mg/dL — ABNORMAL HIGH (ref 70–99)

## 2024-03-19 LAB — URIC ACID: Uric Acid, Serum: 2.3 mg/dL — ABNORMAL LOW (ref 2.5–7.1)

## 2024-03-19 LAB — CBG MONITORING, ED: Glucose-Capillary: 162 mg/dL — ABNORMAL HIGH (ref 70–99)

## 2024-03-19 LAB — MAGNESIUM: Magnesium: 1.4 mg/dL — ABNORMAL LOW (ref 1.7–2.4)

## 2024-03-19 LAB — CREATININE, URINE, RANDOM: Creatinine, Urine: 22 mg/dL

## 2024-03-19 LAB — TSH: TSH: 0.771 u[IU]/mL (ref 0.350–4.500)

## 2024-03-19 LAB — CORTISOL-AM, BLOOD: Cortisol - AM: 17.4 ug/dL (ref 6.7–22.6)

## 2024-03-19 LAB — SODIUM, URINE, RANDOM: Sodium, Ur: 128 mmol/L

## 2024-03-19 LAB — SODIUM: Sodium: 125 mmol/L — ABNORMAL LOW (ref 135–145)

## 2024-03-19 MED ORDER — HYDRALAZINE HCL 20 MG/ML IJ SOLN
10.0000 mg | INTRAMUSCULAR | Status: DC | PRN
  Filled 2024-03-19: qty 1

## 2024-03-19 MED ORDER — BISACODYL 10 MG RE SUPP
10.0000 mg | Freq: Every day | RECTAL | Status: DC | PRN
  Administered 2024-03-19 – 2024-03-24 (×3): 10 mg via RECTAL
  Filled 2024-03-19 (×3): qty 1

## 2024-03-19 MED ORDER — ROSUVASTATIN CALCIUM 10 MG PO TABS
5.0000 mg | ORAL_TABLET | Freq: Every day | ORAL | Status: DC
  Administered 2024-03-19 – 2024-03-23 (×5): 5 mg via ORAL
  Filled 2024-03-19 (×5): qty 1

## 2024-03-19 MED ORDER — ADULT MULTIVITAMIN W/MINERALS CH
1.0000 | ORAL_TABLET | Freq: Every day | ORAL | Status: DC
  Administered 2024-03-20 – 2024-03-24 (×5): 1 via ORAL
  Filled 2024-03-19 (×5): qty 1

## 2024-03-19 MED ORDER — SODIUM CHLORIDE 0.9 % IV SOLN: INTRAVENOUS | Status: AC

## 2024-03-19 MED ORDER — SENNOSIDES-DOCUSATE SODIUM 8.6-50 MG PO TABS
2.0000 | ORAL_TABLET | Freq: Once | ORAL | Status: AC
Start: 2024-03-19 — End: 2024-03-19
  Administered 2024-03-19: 2 via ORAL
  Filled 2024-03-19: qty 2

## 2024-03-19 MED ORDER — ENSURE PLUS HIGH PROTEIN PO LIQD: 237.0000 mL | Freq: Two times a day (BID) | ORAL | Status: DC

## 2024-03-19 MED ORDER — VITAMIN D3 25 MCG (1000 UNIT) PO TABS
2000.0000 [IU] | ORAL_TABLET | Freq: Every day | ORAL | Status: DC
  Administered 2024-03-19 – 2024-03-24 (×6): 2000 [IU] via ORAL
  Filled 2024-03-19 (×10): qty 2

## 2024-03-19 MED ORDER — DORZOLAMIDE HCL-TIMOLOL MAL 2-0.5 % OP SOLN
1.0000 [drp] | Freq: Two times a day (BID) | OPHTHALMIC | Status: DC
  Administered 2024-03-19 – 2024-03-24 (×10): 1 [drp] via OPHTHALMIC
  Filled 2024-03-19 (×3): qty 10

## 2024-03-19 MED ORDER — PRIMIDONE 50 MG PO TABS
25.0000 mg | ORAL_TABLET | Freq: Every day | ORAL | Status: DC
  Administered 2024-03-19 – 2024-03-23 (×5): 50 mg via ORAL
  Filled 2024-03-19 (×6): qty 1

## 2024-03-19 MED ORDER — HYDRALAZINE HCL 20 MG/ML IJ SOLN
5.0000 mg | Freq: Once | INTRAMUSCULAR | Status: AC
  Administered 2024-03-19: 5 mg via INTRAVENOUS
  Filled 2024-03-19: qty 1

## 2024-03-19 MED ORDER — LEVOTHYROXINE SODIUM 50 MCG PO TABS
50.0000 ug | ORAL_TABLET | Freq: Every day | ORAL | Status: DC
  Administered 2024-03-19 – 2024-03-24 (×6): 50 ug via ORAL
  Filled 2024-03-19 (×6): qty 1

## 2024-03-19 MED ORDER — MAGNESIUM CHLORIDE 64 MG PO TBEC
1.0000 | DELAYED_RELEASE_TABLET | Freq: Every day | ORAL | Status: DC
  Administered 2024-03-19 – 2024-03-24 (×6): 64 mg via ORAL
  Filled 2024-03-19 (×6): qty 1

## 2024-03-19 MED ORDER — IRBESARTAN 150 MG PO TABS
150.0000 mg | ORAL_TABLET | Freq: Every day | ORAL | Status: DC
  Administered 2024-03-20 – 2024-03-24 (×5): 150 mg via ORAL
  Filled 2024-03-19 (×5): qty 1

## 2024-03-19 MED ORDER — POLYETHYLENE GLYCOL 3350 17 G PO PACK
17.0000 g | PACK | Freq: Every day | ORAL | Status: DC
  Administered 2024-03-19 – 2024-03-24 (×5): 17 g via ORAL
  Filled 2024-03-19 (×4): qty 1

## 2024-03-19 MED ORDER — IRBESARTAN 150 MG PO TABS
75.0000 mg | ORAL_TABLET | Freq: Every day | ORAL | Status: DC
Start: 2024-03-19 — End: 2024-03-19
  Administered 2024-03-19: 75 mg via ORAL
  Filled 2024-03-19: qty 1

## 2024-03-19 MED ORDER — HYDRALAZINE HCL 20 MG/ML IJ SOLN
5.0000 mg | INTRAMUSCULAR | Status: DC | PRN
  Administered 2024-03-19: 5 mg via INTRAVENOUS
  Filled 2024-03-19: qty 1

## 2024-03-19 MED ORDER — THIAMINE HCL 100 MG PO TABS
100.0000 mg | ORAL_TABLET | Freq: Every day | ORAL | Status: DC
  Administered 2024-03-20 – 2024-03-24 (×5): 100 mg via ORAL
  Filled 2024-03-19 (×9): qty 1

## 2024-03-19 MED ORDER — BOOST PO LIQD
237.0000 mL | Freq: Three times a day (TID) | ORAL | Status: DC
  Administered 2024-03-19 – 2024-03-24 (×3): 237 mL via ORAL

## 2024-03-19 MED ORDER — ENOXAPARIN SODIUM 30 MG/0.3ML IJ SOSY
30.0000 mg | PREFILLED_SYRINGE | INTRAMUSCULAR | Status: DC
  Administered 2024-03-19 – 2024-03-24 (×6): 30 mg via SUBCUTANEOUS
  Filled 2024-03-19 (×6): qty 0.3

## 2024-03-19 MED ORDER — MAGNESIUM SULFATE 4 GM/100ML IV SOLN
4.0000 g | Freq: Once | INTRAVENOUS | Status: AC
  Administered 2024-03-19: 4 g via INTRAVENOUS
  Filled 2024-03-19: qty 100

## 2024-03-19 MED ORDER — HYDRALAZINE HCL 20 MG/ML IJ SOLN
10.0000 mg | INTRAMUSCULAR | Status: DC | PRN
  Administered 2024-03-20: 10 mg via INTRAVENOUS

## 2024-03-19 NOTE — ED Notes (Signed)
 This NT assisted pt to the toilet and back to bed. Pt ambulated well with little assistance. Pt resting comfortably in bed.

## 2024-03-19 NOTE — Hospital Course (Addendum)
 Hospital course / significant events:   Barbara Santos is a 88 y.o. year old female with medical history of HTN, HLD, T2DM presenting to the ED after a fall.   HPI: Pt states she had a fall when her legs gave out.  She fell outside and tried to break her fall where she ended up hitting her knees and elbows which resulted in a laceration.  She says she did not hit her head or had any loss of consciousness.  She was able to get up but just felt weak.  She denies any palpitations, dizziness or lightheadedness around this time.  Patient reports a good appetite but discussing with son and daughter-in-law they report patient has decreased appetite and has lost weight recently.  It appears patient's metformin  was recently increased on 10/15.   11/25: admitted w/ recurrent fall / weakness and hyponatremia  11/26: no significant improvement in sodium, cortisol normal, suspect SIADH, resumed normal saline, following BMP  11/27: sodium corrected for hyperglycemia 124-126. Glc worsening. Added sliding scale and will get A1C, last A1c not indicative of poor control so will check now in case substantially worse. Glc pm also corrects to 124-126. Fluid restricting, adding salt tabs, eval for malignancy w/ CT C/A/P 11/28: remains hyperglycemic but some improved. Sodium continues to correct to about 125-126. CT thankfully no malignancy, she will need follow up lung nodules, question if this + emphysema explains SIADH. A1C still not resulted. Sodium this afternoon corrected 128-129 11/29: sodium corrects to 131 last night, to 134 today this morning. Increase intervals for BMP to q12h. Eased fluid restriction to 1800 mL/24h up from 1500. A1C 8.0 which doesn't seem to explain dramatic hyperglycemia here, keep plan for outpatient endocrine follow up but Glc control is improved on current insulin  regimen 11/30: doing well, sodium and Glc trending appropriately, continue current orders and await SNF placement        Consultants:  none  Procedures/Surgeries: none      ASSESSMENT & PLAN:   Acute on chronic hyponatremia Pseudohyponatremia in setting of hyperglycemia but still corrected sodium remains low  Hypotonic hyponatremia  Baseline Na about 132 No thiazide, normal TSH, no hypervolemia Cortisol level --> WNL Uosm and UNa up = in range for SIADH / reset osmostat  History = concern for tea/toast diet, low sodium intake, low nutrient intake  FE Uric Acid (>12% also points to SIADH) - 16.52% indicative of SIADH  question paraneoplastic? --> CT C/A/P no evidence for malignancy but did show lung nodules and emphysema see below Fluid restricting 1800 mL/d continue salt tabs reduce bid  May benefit from outpatient endocrinology  Significant imaging: CT C/A/P no evidence for malignancy but did show lung nodules and emphysema, question if this explains the SIADH Outpatient follow up Will need repeat CT chest to follow nodules  Type 2 diabetes mellitus:  Recently increase in metformin  which can have effect on appetite and may have led to decreased oral intake for this patient.  Given A1c recently was less than 8, felt comfortable holding home meds but Glc then significantly higher  Continue insulin  mealtime SSI + basal May benefit from outpatient endocrinology  Fall: Likely mechanical, however unsteadiness may be d/t low sodium.  PT and OT consulted - recs for SNF rehab    Hypothyroidism:TSH WNL no concern for hypothryoid contributing to hyponatremia Continue home Synthroid    Hyperlipidemia:  Continue home statin  Hypomagnesemia replace   Hypertension:  BP high here Increased home ARB IV hydralazine  prn  Intermittent esential tremors. Continue primidone    Cholelithiasis without acute cholecystitis, and Renal cyst on CT - not clinically significant at this time No follow up needed   underweight based on BMI: Body mass index is 16.25 kg/m.SABRA Significantly low or  high BMI is associated with higher medical risk.  Suspect severe malnutrition Underweight - under 18  overweight - 25 to 29 obese - 30 or more Class 1 obesity: BMI of 30.0 to 34 Class 2 obesity: BMI of 35.0 to 39 Class 3 obesity: BMI of 40.0 to 49 Super Morbid Obesity: BMI 50-59 Super-super Morbid Obesity: BMI 60+ Healthy nutrition and physical activity advised as adjunct to other disease management and risk reduction treatments Dietician consult    DVT prophylaxis: lovenox  IV fluids: normal saline continuous IV fluids  Nutrition: regular (dietician consult given significant underweight) w/ fluid restrict  Central lines / other devices: none  Code Status: DNR ACP documentation reviewed:  none on file in [facility]  [facility] needs: SNF placment  Medical barriers to dispo: none.

## 2024-03-19 NOTE — ED Notes (Signed)
 Pt ambulated to in room toilet with a stand by assist from staff. Pt was steady on their feet until they tried to get their clothing down and pt started to fall backwards and was steadied by this RN. Pt stated that they felt unsteady when they started to fall backwards. Pt returned to the bed with a stand by assist and pt was reconnected to VS monitoring equipment. Pt's bed is in the lowest, locked position with call bell in reach. No other needs voiced at this time.

## 2024-03-19 NOTE — Evaluation (Signed)
 Physical Therapy Evaluation Patient Details Name: Barbara Santos MRN: [id number] DOB: 06-Jun-1935 Today's Date: 03/19/2024  History of Present Illness  88yoF comes to [facility] on 03/18/24 after fall onto left elbow, legs gave out on her. PMH: HTN, HLD, DM2. UA without signs of infection.  CT head without any acute findings and CT cervical spine without any acute findings, plain radiography of the left knee without any acute findings.  Patient with laceration of the left elbow and imaging of the head is negative.  The laceration was repaired but given her recurrent fall and moderate hyponatremia  Clinical Impression  Pt in bed on arrival, just completed step pivot transfer back to bed from [facility]. Pt reports BLE remains curiously weak but improved compared to yesterday. Pt gives narrative of progressive weakness in legs and several episodes of legs giving out over the past 2-3 weeks, denies associated dizziness. Pt unable to perform any meaningful AMB at this time due to elevated pressures outside of safe range, multiple vitals taken with SBP >239mmHg. Pt globally weak requiring minA for bed mobility and transfers at this time, whereas baseline includes full independence with [facility], grocery shopping, driving. A STR stay would allow for best recovery of function prior to return to home alone. Will continue to follow.       If plan is discharge home, recommend the following: A lot of help with walking and/or transfers;A lot of help with bathing/dressing/bathroom;Direct supervision/assist for medications management;Assistance with cooking/housework   Can travel by private vehicle   Yes    Equipment Recommendations None recommended by PT  Recommendations for Other Services       Functional Status Assessment Patient has had a recent decline in their functional status and demonstrates the ability to make significant improvements in function in a reasonable and predictable amount of time.      Precautions / Restrictions Precautions Precautions: Fall Restrictions Weight Bearing Restrictions Per Provider Order: No      Mobility  Bed Mobility Overal bed mobility: Needs Assistance Bed Mobility: Supine to Sit, Sit to Supine     Supine to sit: Min assist, HOB elevated Sit to supine: Min assist, HOB elevated   General bed mobility comments: global weakness    Transfers Overall transfer level: Needs assistance Equipment used: Rolling walker (2 wheels), 1 person hand held assist Transfers: Bed to chair/wheelchair/BSC     Step pivot transfers: Min assist       General transfer comment: bed to [facility], then BSC to bed; legs remain week, pt requires minA for this.    Ambulation/Gait Ambulation/Gait assistance:  (not safe to attempt at this time.)                Stairs            Wheelchair Mobility     Tilt Bed    Modified Rankin (Stroke Patients Only)       Balance Overall balance assessment: History of Falls                                           Pertinent Vitals/Pain Pain Assessment Pain Assessment: No/denies pain    Home Living Family/patient expects to be discharged to:: Private residence Living Arrangements: Alone   Type of Home: House Home Access: Stairs to enter Entrance Stairs-Rails: Right Entrance Stairs-Number of Steps: 2   Home Layout: One level Home  Equipment: Cane - quad;Shower seat - built [employer] (2 wheels) Additional Comments: son just ordered a RW 2 days prior.    Prior Function Prior Level of Function : Independent/Modified Independent;Driving;History of Falls (last six months)             Mobility Comments: several falls in the past 2 weeks, all associated with leg weakness, no dizziness.       Extremity/Trunk Assessment                Communication        Cognition Arousal: Alert Behavior During Therapy: WFL for tasks assessed/performed   PT - Cognitive  impairments: No apparent impairments                                 Cueing       General Comments      Exercises     Assessment/Plan    PT Assessment Patient needs continued PT services  PT Problem List Decreased strength;Decreased activity tolerance;Decreased mobility       PT Treatment Interventions DME instruction;Therapeutic exercise;Gait training;Stair training;Functional mobility training;Therapeutic activities;Neuromuscular re-education    PT Goals (Current goals can be found in the Care Plan section)  Acute Rehab PT Goals Patient Stated Goal: regain leg strength, avoid additional falls. Time For Goal Achievement: 04/02/24 Potential to Achieve Goals: Good    Frequency Min 2X/week     Co-evaluation               AM-PAC PT 6 Clicks Mobility  Outcome Measure Help needed turning from your back to your side while in a flat bed without using bedrails?: A Lot Help needed moving from lying on your back to sitting on the side of a flat bed without using bedrails?: A Lot Help needed moving to and from a bed to a chair (including a wheelchair)?: A Lot Help needed standing up from a chair using your arms (e.g., wheelchair or bedside chair)?: A Lot Help needed to walk in hospital room?: Total Help needed climbing 3-5 steps with a railing? : Total 6 Click Score: 10    End of Session Equipment Utilized During Treatment: Gait belt Activity Tolerance: Treatment limited secondary to medical complications (Comment);Patient tolerated treatment well Patient left: in bed;with family/visitor present Nurse Communication: Mobility status (BP) PT Visit Diagnosis: Unsteadiness on feet (R26.81);Other abnormalities of gait and mobility (R26.89);Difficulty in walking, not elsewhere classified (R26.2)    Time: 8597-8579 PT Time Calculation (min) (ACUTE ONLY): 18 min   Charges:   PT Evaluation $PT Eval Moderate Complexity: 1 Mod   PT General Charges $$ ACUTE PT  VISIT: 1 Visit    2:51 PM, 03/19/24 Peggye JAYSON Linear, PT, DPT Physical Therapist - [facility]  [phone] (ASCOM)      Doroteo Nickolson C 03/19/2024, 2:45 PM

## 2024-03-19 NOTE — ED Notes (Signed)
 Called pharmacy about eye drops not being in pt bin or tube station.

## 2024-03-19 NOTE — Progress Notes (Signed)
 PROGRESS NOTE    Barbara Santos   FMW:[id number] DOB: Sep 13, 1935  DOA: 03/18/2024 Date of Service: 03/19/24 which is hospital day 0  PCP: Edman Marsa PARAS, [facility] course / significant events:   Barbara Santos is a 88 y.o. year old female with medical history of HTN, HLD, T2DM presenting to the ED after a fall.   HPI: Pt states she had a fall when her legs gave out.  She fell outside and tried to break her fall where she ended up hitting her knees and elbows which resulted in a laceration.  She says she did not hit her head or had any loss of consciousness.  She was able to get up but just felt weak.  She denies any palpitations, dizziness or lightheadedness around this time.  Patient reports a good appetite but discussing with son and daughter-in-law they report patient has decreased appetite and has lost weight recently.  It appears patient's metformin  was recently increased on 10/15.   11/25: admitted w/ recurrent fall / weakness and hyponatremia  11/26: no significant improvement in sodium, cortisol normal, suspect SIADH, resumed normal saline, following BMP      Consultants:  none  Procedures/Surgeries: none      ASSESSMENT & PLAN:   Acute on chronic hyponatremia Hypotonic hyponatremia  Baseline Na about 132 No thiazide, normal TSH, no hypervolemia,  Uosm and UNa up / in range for SIADH / reset osmostat / Cortisol level --> WNL Calculate FE Uric Acid (>12% also points to SIADH)    Fall: Likely mechanical, however unsteadiness may be d/t low sodium.  orthostatic vitals.   PT and OT consulted.   Hypothyroidism:TSH WNL no concern for hypothryoid contributing to hyponatremia Continue home Synthroid    Hyperlipidemia:  Continue home statin  Hypomagnesemia replace   Hypertension:  BP high here Increased home ARB IV hydralazine  prn    Type 2 diabetes mellitus:  Recently increase in metformin  which can have effect on appetite and may  have led to decreased oral intake for this patient.  She does report good oral intake but family reports otherwise -pt regularly eats but eats very little.  Given A1c recently was less than 8, I think this medication can be decreased to 500 mg twice daily.  This can be done by PCP or at discharge. GLc monitor here    Intermittent esential tremors. Continue primidone     underweight based on BMI: Body mass index is 16.25 kg/m.SABRA Significantly low or high BMI is associated with higher medical risk.  Suspect severe malnutrition Underweight - under 18  overweight - 25 to 29 obese - 30 or more Class 1 obesity: BMI of 30.0 to 34 Class 2 obesity: BMI of 35.0 to 39 Class 3 obesity: BMI of 40.0 to 49 Super Morbid Obesity: BMI 50-59 Super-super Morbid Obesity: BMI 60+ Healthy nutrition and physical activity advised as adjunct to other disease management and risk reduction treatments Dietician consult    DVT prophylaxis: lovenoc IV fluids: normal saline continuous IV fluids  Nutrition: regular (dietician consult given significant underweight)  Central lines / other devices: none  Code Status: DNR ACP documentation reviewed:  none on file in [facility]  TOC needs: TBD Medical barriers to dispo: hyponatermis. Expected medical readiness for discharge couple days.              Subjective / Brief ROS:  Patient reports feeling tired / unsteady on her feet but ok ar rest She and family reports  she eats regularly, fmily reports she doesn't each very much though in terms of portions Denies CP/SOB.  Pain controlled.  Denies new weakness.  Tolerating diet.  Reports no concerns w/ urination/defecation.   Family Communication: family at bedside on rounds     Objective Findings:  Vitals:   03/19/24 1416 03/19/24 1430 03/19/24 1437 03/19/24 1534  BP: (!) 200/94 (!) 190/98 (!) 190/98 (!) 187/84  Pulse:  74  81  Resp:  13  18  Temp:    (!) 97.5 F (36.4 C)  TempSrc:    Oral  SpO2:  (!) 81% 97%  97%  Weight:      Height:        Intake/Output Summary (Last 24 hours) at 03/19/2024 1632 Last data filed at 03/19/2024 0503 Gross per 24 hour  Intake 599.49 ml  Output --  Net 599.49 ml   Filed Weights   03/18/24 1745  Weight: 39 kg    Examination:  Physical Exam Constitutional:      General: She is not in acute distress.    Appearance: She is ill-appearing.  Cardiovascular:     Rate and Rhythm: Normal rate and regular rhythm.  Pulmonary:     Effort: Pulmonary effort is normal.     Breath sounds: Normal breath sounds.  Abdominal:     General: Bowel sounds are normal.     Palpations: Abdomen is soft.  Musculoskeletal:     Right lower leg: No edema.     Left lower leg: No edema.  Skin:    General: Skin is dry.     Coloration: Skin is pale.  Neurological:     General: No focal deficit present.     Mental Status: She is alert and oriented to person, place, and time. Mental status is at baseline.  Psychiatric:        Mood and Affect: Mood normal.        Behavior: Behavior normal.          Scheduled Medications:   cholecalciferol   2,000 Units Oral Daily   dorzolamide -timolol   1 drop Both Eyes BID   enoxaparin  (LOVENOX ) injection  30 mg Subcutaneous Q24H   [START ON 03/20/2024] feeding supplement  237 mL Oral BID BM   hydrALAZINE   5 mg Intravenous Once   [START ON 03/20/2024] irbesartan   150 mg Oral Daily   lactose free nutrition  237 mL Oral TID BM   levothyroxine   50 mcg Oral Q0600   magnesium  chloride  1 tablet Oral Daily   [START ON 03/20/2024] multivitamin with minerals  1 tablet Oral Daily   polyethylene glycol  17 g Oral Daily   primidone   25-50 mg Oral QHS   rosuvastatin   5 mg Oral QHS   sodium chloride  flush  3 mL Intravenous Q12H   [START ON 03/20/2024] thiamine   100 mg Oral Daily    Continuous Infusions:  sodium chloride  100 mL/hr at 03/19/24 1311    PRN Medications:  acetaminophen  **OR** acetaminophen , bisacodyl , hydrALAZINE ,  hydrALAZINE   Antimicrobials from admission:  Anti-infectives (From admission, onward)    None           Data Reviewed:  I have personally reviewed the following...  CBC: Recent Labs  Lab 03/18/24 1842 03/19/24 0604  WBC 8.3 7.8  NEUTROABS 5.4  --   HGB 11.5* 12.0  HCT 33.1* 34.4*  MCV 89.5 88.9  PLT 244 257   Basic Metabolic Panel: Recent Labs  Lab 03/18/24 1842 03/18/24 2320 03/19/24  0604  NA 125* 125* 126*  K 4.1  --  3.9  CL 89*  --  92*  CO2 27  --  25  GLUCOSE 180*  --  173*  BUN 16  --  10  CREATININE 0.45  --  0.40*  CALCIUM  9.6  --  8.5*  MG  --  1.4*  --    GFR: Estimated Creatinine Clearance: 31.1 mL/min (A) (by C-G formula based on SCr of 0.4 mg/dL (L)). Liver Function Tests: Recent Labs  Lab 03/18/24 1842  AST 26  ALT 20  ALKPHOS 73  BILITOT 0.4  PROT 6.6  ALBUMIN 4.4   No results for input(s): LIPASE, AMYLASE in the last 168 hours. No results for input(s): AMMONIA in the last 168 hours. Coagulation Profile: No results for input(s): INR, PROTIME in the last 168 hours. Cardiac Enzymes: No results for input(s): CKTOTAL, CKMB, CKMBINDEX, TROPONINI in the last 168 hours. BNP (last 3 results) No results for input(s): PROBNP in the last 8760 hours. HbA1C: No results for input(s): HGBA1C in the last 72 hours. CBG: Recent Labs  Lab 03/19/24 0723  GLUCAP 162*   Lipid Profile: No results for input(s): CHOL, HDL, LDLCALC, TRIG, CHOLHDL, LDLDIRECT in the last 72 hours. Thyroid  Function Tests: Recent Labs    03/18/24 2320  TSH 0.771   Anemia Panel: No results for input(s): VITAMINB12, FOLATE, FERRITIN, TIBC, IRON, RETICCTPCT in the last 72 hours. Most Recent Urinalysis On File:     Component Value Date/Time   COLORURINE YELLOW (A) 03/18/2024 1922   APPEARANCEUR CLEAR (A) 03/18/2024 1922   LABSPEC 1.014 03/18/2024 1922   PHURINE 7.0 03/18/2024 1922   GLUCOSEU 150 (A) 03/18/2024 1922    HGBUR NEGATIVE 03/18/2024 1922   BILIRUBINUR NEGATIVE 03/18/2024 1922   BILIRUBINUR Negative 01/07/2020 0933   KETONESUR NEGATIVE 03/18/2024 1922   PROTEINUR NEGATIVE 03/18/2024 1922   UROBILINOGEN 0.2 01/07/2020 0933   NITRITE NEGATIVE 03/18/2024 1922   LEUKOCYTESUR TRACE (A) 03/18/2024 1922   Sepsis Labs: @LABRCNTIP (procalcitonin:4,lacticidven:4) Microbiology: No results found for this or any previous visit (from the past 240 hours).    Radiology Studies last 3 days: CT Cervical Spine Wo Contrast Result Date: 03/18/2024 EXAM: CT CERVICAL SPINE WITHOUT CONTRAST 03/18/2024 07:00:54 PM TECHNIQUE: CT of the cervical spine was performed without the administration of intravenous contrast. Multiplanar reformatted images are provided for review. Automated exposure control, iterative reconstruction, and/or weight based adjustment of the mA/kV was utilized to reduce the radiation dose to as low as reasonably achievable. COMPARISON: None available. CLINICAL HISTORY: Neck trauma (Age >= 65y) FINDINGS: CERVICAL SPINE: BONES AND ALIGNMENT: Straightening of the normal cervical lordosis. No evidence of traumatic malalignment. No acute fracture. DEGENERATIVE CHANGES: Disc space narrowing most pronounced at C4-C5, C6-C7, and C7-T1. Degenerative endplate osteophytes at multiple levels. There is no high grade osseous spinal canal stenosis. Facet arthrosis and no facet hypertrophy at multiple levels. Foraminal stenosis most pronounced at C5-C6. SOFT TISSUES: No prevertebral soft tissue swelling. LUNGS: Biapical pleural parenchymal scarring. IMPRESSION: 1. No acute abnormality of the cervical spine related to the reported neck trauma. 2. Degenerative changes as above. Electronically signed by: Donnice Mania MD 03/18/2024 07:31 PM EST RP Workstation: [id number]   CT Head Wo Contrast Result Date: 03/18/2024 EXAM: CT HEAD WITHOUT 03/18/2024 07:00:54 PM TECHNIQUE: CT of the head was performed without the  administration of intravenous contrast. Automated exposure control, iterative reconstruction, and/or weight based adjustment of the mA/kV was utilized to reduce the radiation dose to  as low as reasonably achievable. COMPARISON: None available. CLINICAL HISTORY: Head trauma, minor (Age >= 65y) FINDINGS: BRAIN AND VENTRICLES: Age related atrophy. No acute intracranial hemorrhage. No mass effect or midline shift. No extra-axial fluid collection. No evidence of acute infarct. No hydrocephalus. ORBITS: Bilateral cataract resection. No acute abnormality. SINUSES AND MASTOIDS: No acute abnormality. SOFT TISSUES AND SKULL: Atherosclerosis of skullbase vasculature without hyperdense vessel or abnormal calcification. No acute skull fracture. No acute soft tissue abnormality. IMPRESSION: 1. No acute intracranial abnormality related to head trauma. Electronically signed by: Donnice Mania MD 03/18/2024 07:26 PM EST RP Workstation: [id number]   DG Knee Complete 4 Views Left Result Date: 03/18/2024 EXAM: 4 OR MORE VIEW(S) Xray of the Left Knee 03/18/2024 06:05:29 PM COMPARISON: None available. CLINICAL HISTORY: fall fall FINDINGS: BONES AND JOINTS: No acute fracture. No focal osseous lesion. No joint dislocation. No significant joint effusion. No significant degenerative changes. SOFT TISSUES: The soft tissues are unremarkable. IMPRESSION: 1. No significant abnormality. Electronically signed by: Elsie Gravely MD 03/18/2024 06:26 PM EST RP Workstation: [id number]   DG Elbow Complete Left Result Date: 03/18/2024 EXAM: 3 VIEW(S) XRAY OF THE LEFT ELBOW COMPARISON: None available. CLINICAL HISTORY: Fall Fall FINDINGS: BONES AND JOINTS: No acute fracture. No focal osseous lesion. No joint dislocation. No joint effusion. SOFT TISSUES: The soft tissues are unremarkable. IMPRESSION: 1. No acute abnormality. Electronically signed by: Elsie Gravely MD 03/18/2024 06:25 PM EST RP Workstation: [id number]       Time spent: 50  min     Laneta Blunt, DO [facility] 03/19/2024, 4:32 PM    Dictation software may have been used to generate the above note. Typos may occur and escape review in typed/dictated notes. Please contact Dr Blunt directly for clarity if needed.  Staff may message me via secure chat in Epic  but this may not receive an immediate response,  please page me for urgent matters!  If 7PM-7AM, please contact night coverage [email or web address]

## 2024-03-19 NOTE — Progress Notes (Signed)
 Initial Nutrition Assessment  DOCUMENTATION CODES:   Underweight  INTERVENTION:   Ensure Plus High Protein po TID, each supplement provides 350 kcal and 20 grams of protein  Magic cup TID with meals, each supplement provides 290 kcal and 9 grams of protein  MVI po daily   Thiamine  100mg  po daily x 7 days   Pt at high refeed risk; recommend monitor potassium, magnesium  and phosphorus labs daily until stable  Daily weights   NUTRITION DIAGNOSIS:   Inadequate oral intake related to acute illness as evidenced by per patient/family report.  GOAL:   Patient will meet greater than or equal to 90% of their needs  MONITOR:   PO intake, Supplement acceptance, Labs, Weight trends, Skin, I & O's  REASON FOR ASSESSMENT:   Consult Assessment of nutrition requirement/status  ASSESSMENT:   88 y/o female with h/o hypothyroidism, HLD, DM, HTN, tremor and hernia surgery who is admitted with weakness, falls and hyponatremia.  RD working remotely.  Family reports pt with poor oral intake and weight loss pta. Pt recently had her metformin  dose increased on 10/15. Per chart, pt appears weight stable for the past year. RD will add supplements and MVI to help pt meet her estimated needs. Pt is likely at high refeed risk. Pt likely meets criteria for severe malnutrition but unable to diagnose at this time. RD will obtain nutrition related history and exam at follow up.   Medications reviewed and include: D3, lovenox , synthroid , miralax   Labs reviewed: Na 126(L), K 3.9 wnl, creat 0.40(L) Mg 1.4(L)- 11/25  NUTRITION - FOCUSED PHYSICAL EXAM: Unable to perform at this time   Diet Order:   Diet Order             Diet regular Room service appropriate? Yes; Fluid consistency: Thin  Diet effective now                  EDUCATION NEEDS:   Not appropriate for education at this time  Skin:   not assessed   Last BM:  pta  Height:   Ht Readings from Last 1 Encounters:  03/18/24 5'  1 (1.549 m)    Weight:   Wt Readings from Last 1 Encounters:  03/18/24 39 kg    Ideal Body Weight:  47.7 kg  BMI:  Body mass index is 16.25 kg/m.  Estimated Nutritional Needs:   Kcal:  1200-1400kcal/day  Protein:  60-70g/day  Fluid:  1.0-1.2L/day  Augustin Shams MS, RD, LDN If unable to be reached, please send secure chat to RD inpatient available from 8:00a-4:00p daily

## 2024-03-19 NOTE — ED Notes (Signed)
 This NT assisted pt to toilet and back to bed. Pt ambulated well, with little assistance. Pt resting comfortably in bed.

## 2024-03-19 NOTE — ED Notes (Signed)
 Pt is being monitored by CCMD

## 2024-03-19 NOTE — Plan of Care (Signed)
  Problem: Education: Goal: Knowledge of General Education information will improve Description: Including pain rating scale, medication(s)/side effects and non-pharmacologic comfort measures Outcome: Progressing   Problem: Clinical Measurements: Goal: Ability to maintain clinical measurements within normal limits will improve Outcome: Progressing Goal: Will remain free from infection Outcome: Progressing Goal: Diagnostic test results will improve Outcome: Progressing Goal: Respiratory complications will improve Outcome: Progressing Goal: Cardiovascular complication will be avoided Outcome: Progressing   Problem: Activity: Goal: Risk for activity intolerance will decrease Outcome: Progressing   Problem: Safety: Goal: Ability to remain free from injury will improve Outcome: Progressing   Problem: Skin Integrity: Goal: Risk for impaired skin integrity will decrease Outcome: Progressing   

## 2024-03-19 NOTE — Evaluation (Signed)
 Occupational Therapy Evaluation Patient Details Name: Barbara Santos MRN: [id number] DOB: February 13, 1936 Today's Date: 03/19/2024   History of Present Illness   88yoF comes to [facility] on 03/18/24 after fall onto left elbow, legs gave out on her. PMH: HTN, HLD, DM2. UA without signs of infection.  CT head without any acute findings and CT cervical spine without any acute findings, plain radiography of the left knee without any acute findings.  Patient with laceration of the left elbow and imaging of the head is negative.  The laceration was repaired but given her recurrent fall and moderate hyponatremia     Clinical Impressions Pt was seen for OT evaluation this date. Prior to hospital admission, pt was MODI ambulating with use of SBQC, completing ADL/IADLs with MODI. Pt lives alone in one level home with two steps to enter. Son and patient were discussing moving into together to aid in pt aging in place with the security of her family around to provided assistance. Pt son reports his wife is retired as well and eager to assist. Pt presents with deficits in decreased Ind in self care, limited balance, functional mobility/transfers and activity tolerance affecting safe and optimal ADL completion. Pt currently requires MINA for bed mobility, CGA with use of RW for [facility] transfers, supervision for pericare and setupA for LB dressing. Pt would benefit from skilled OT services to address noted impairments and functional limitations (see below for any additional details) in order to maximize safety and independence while minimizing future risk of falls, injury, and readmission. OT will follow acutely.    If plan is discharge home, recommend the following:   A little help with walking and/or transfers;A little help with bathing/dressing/bathroom;Assistance with cooking/housework;Assist for transportation;Help with stairs or ramp for entrance     Functional Status Assessment   Patient has had a recent  decline in their functional status and demonstrates the ability to make significant improvements in function in a reasonable and predictable amount of time.     Equipment Recommendations   BSC/3in1     Recommendations for Other Services         Precautions/Restrictions   Precautions Precautions: Fall Recall of Precautions/Restrictions: Intact Restrictions Weight Bearing Restrictions Per Provider Order: No     Mobility Bed Mobility Overal bed mobility: Needs Assistance Bed Mobility: Supine to Sit, Sit to Supine     Supine to sit: Min assist, HOB elevated Sit to supine: Min assist, HOB elevated   General bed mobility comments: 1 HHA, verbal cues for scooting up in bed    Transfers Overall transfer level: Needs assistance Equipment used: Rolling walker (2 wheels) Transfers: Bed to chair/wheelchair/BSC       Step pivot transfers: Contact guard assist     General transfer comment: CGA for BSC transfer with use or RW      Balance Overall balance assessment: Needs assistance Sitting-balance support: Single extremity supported, Feet supported Sitting balance-Leahy Scale: Fair     Standing balance support: Bilateral upper extremity supported, During functional activity, Reliant on assistive device for balance Standing balance-Leahy Scale: Fair                             ADL either performed or assessed with clinical judgement   ADL Overall ADL's : Needs assistance/impaired                     Lower Body Dressing: Set up;Cueing for sequencing;Sitting/lateral leans  Lower Body Dressing Details (indicate cue type and reason): Donning bilateral socks while seated on the EOB Toilet Transfer: Contact guard assist;Rolling walker (2 wheels);BSC/3in1;Cueing for safety Toilet Transfer Details (indicate cue type and reason): Via step pivot transfer Toileting- Clothing Manipulation and Hygiene: Supervision/safety;Sitting/lateral lean        Functional mobility during ADLs: Rolling walker (2 wheels);Contact guard assist;Cueing for sequencing General ADL Comments: Step pivot transfer from EOB<>recliner with RW+CGA     Vision         Perception         Praxis         Pertinent Vitals/Pain Pain Assessment Pain Assessment: Faces Faces Pain Scale: Hurts little more Pain Location: lower legs Pain Descriptors / Indicators: Cramping Pain Intervention(s): Limited activity within patient's tolerance, Monitored during session, Repositioned     Extremity/Trunk Assessment Upper Extremity Assessment Upper Extremity Assessment: Generalized weakness   Lower Extremity Assessment Lower Extremity Assessment: Defer to PT evaluation   Cervical / Trunk Assessment Cervical / Trunk Assessment: Normal   Communication Communication Communication: No apparent difficulties   Cognition Arousal: Alert Behavior During Therapy: WFL for tasks assessed/performed Cognition: No apparent impairments                               Following commands: Intact       Cueing  General Comments   Cueing Techniques: Verbal cues  Pt endorses not recieving lunch this date, called cafe and notified RN   Exercises Exercises: Other exercises Other Exercises Other Exercises: Edu: Role of OT session, DME management, fall prevention, discharge planning   Shoulder Instructions      Home Living Family/patient expects to be discharged to:: Private residence Living Arrangements: Alone Available Help at Discharge: Family;Available 24 hours/day Type of Home: House Home Access: Stairs to enter [employer] of Steps: 2 Entrance Stairs-Rails: Right Home Layout: One level     Bathroom Shower/Tub: Walk-in shower         Home Equipment: Cane - quad;Shower seat - built [employer] (2 wheels)   Additional Comments: Son just ordered a RW 2 days prior. Son reports him and his wife can move in if need be       Prior Functioning/Environment Prior Level of Function : Independent/Modified Independent;Driving;History of Falls (last six months)             Mobility Comments: several falls in the past 2 weeks, all associated with leg weakness, no dizziness. ADLs Comments: indep PTA    OT Problem List: Decreased strength;Decreased activity tolerance;Impaired balance (sitting and/or standing);Decreased coordination;Decreased safety awareness;Decreased knowledge of use of DME or AE;Decreased knowledge of precautions   OT Treatment/Interventions: Self-care/ADL training;Therapeutic exercise;Energy conservation;DME and/or AE instruction;Therapeutic activities;Patient/family education      OT Goals(Current goals can be found in the care plan section)   Acute Rehab OT Goals Patient Stated Goal: No more falling OT Goal Formulation: With patient Time For Goal Achievement: 04/02/24 Potential to Achieve Goals: Good ADL Goals Pt Will Perform Grooming: standing;with modified independence Pt Will Perform Lower Body Dressing: with modified independence;sitting/lateral leans Pt Will Transfer to Toilet: with modified independence;ambulating Pt Will Perform Toileting - Clothing Manipulation and hygiene: with modified independence;sit to/from stand   OT Frequency:  Min 2X/week    Co-evaluation              AM-PAC OT 6 Clicks Daily Activity     Outcome Measure Help from another  person eating meals?: None Help from another person taking care of personal grooming?: A Little Help from another person toileting, which includes using toliet, bedpan, or urinal?: A Little Help from another person bathing (including washing, rinsing, drying)?: A Little Help from another person to put on and taking off regular upper body clothing?: None Help from another person to put on and taking off regular lower body clothing?: None 6 Click Score: 21   End of Session Equipment Utilized During Treatment: Gait  belt;Rolling walker (2 wheels) Nurse Communication: Mobility status  Activity Tolerance: Patient tolerated treatment well Patient left: in bed;with call bell/phone within reach;with bed alarm set;with nursing/sitter in room;with family/visitor present  OT Visit Diagnosis: Unsteadiness on feet (R26.81);Other abnormalities of gait and mobility (R26.89);Repeated falls (R29.6);Muscle weakness (generalized) (M62.81)                Time: 8562-8543 OT Time Calculation (min): 19 min Charges:  OT General Charges $OT Visit: 1 Visit OT Evaluation $OT Eval Low Complexity: 1 Low  Larraine Colas M.S. OTR/L  03/19/24, 4:11 PM

## 2024-03-20 ENCOUNTER — Inpatient Hospital Stay

## 2024-03-20 DIAGNOSIS — E871 Hypo-osmolality and hyponatremia: Secondary | ICD-10-CM | POA: Diagnosis not present

## 2024-03-20 LAB — BASIC METABOLIC PANEL WITH GFR
Anion gap: 11 (ref 5–15)
Anion gap: 12 (ref 5–15)
Anion gap: 9 (ref 5–15)
Anion gap: 8 (ref 5–15)
BUN: 13 mg/dL (ref 8–23)
BUN: 13 mg/dL (ref 8–23)
BUN: 16 mg/dL (ref 8–23)
BUN: 24 mg/dL — ABNORMAL HIGH (ref 8–23)
CO2: 20 mmol/L — ABNORMAL LOW (ref 22–32)
CO2: 21 mmol/L — ABNORMAL LOW (ref 22–32)
CO2: 24 mmol/L (ref 22–32)
CO2: 24 mmol/L (ref 22–32)
Calcium: 9 mg/dL (ref 8.9–10.3)
Calcium: 9.1 mg/dL (ref 8.9–10.3)
Calcium: 8.9 mg/dL (ref 8.9–10.3)
Calcium: 8.8 mg/dL — ABNORMAL LOW (ref 8.9–10.3)
Chloride: 90 mmol/L — ABNORMAL LOW (ref 98–111)
Chloride: 87 mmol/L — ABNORMAL LOW (ref 98–111)
Chloride: 85 mmol/L — ABNORMAL LOW (ref 98–111)
Chloride: 90 mmol/L — ABNORMAL LOW (ref 98–111)
Creatinine, Ser: 0.44 mg/dL (ref 0.44–1.00)
Creatinine, Ser: 0.49 mg/dL (ref 0.44–1.00)
Creatinine, Ser: 0.78 mg/dL (ref 0.44–1.00)
Creatinine, Ser: 0.79 mg/dL (ref 0.44–1.00)
GFR, Estimated: >60 mL/min (ref >60)
GFR, Estimated: >60 mL/min (ref >60)
GFR, Estimated: >60 mL/min (ref >60)
GFR, Estimated: >60 mL/min (ref >60)
Glucose, Bld: 301 mg/dL — ABNORMAL HIGH (ref 70–99)
Glucose, Bld: 295 mg/dL — ABNORMAL HIGH (ref 70–99)
Glucose, Bld: 436 mg/dL — ABNORMAL HIGH (ref 70–99)
Glucose, Bld: 260 mg/dL — ABNORMAL HIGH (ref 70–99)
Potassium: 3.9 mmol/L (ref 3.5–5.1)
Potassium: 4.2 mmol/L (ref 3.5–5.1)
Potassium: 4.7 mmol/L (ref 3.5–5.1)
Potassium: 4.6 mmol/L (ref 3.5–5.1)
Sodium: 121 mmol/L — ABNORMAL LOW (ref 135–145)
Sodium: 121 mmol/L — ABNORMAL LOW (ref 135–145)
Sodium: 118 mmol/L — CL (ref 135–145)
Sodium: 122 mmol/L — ABNORMAL LOW (ref 135–145)

## 2024-03-20 LAB — GLUCOSE, CAPILLARY
Glucose-Capillary: 267 mg/dL — ABNORMAL HIGH (ref 70–99)
Glucose-Capillary: 275 mg/dL — ABNORMAL HIGH (ref 70–99)
Glucose-Capillary: 443 mg/dL — ABNORMAL HIGH (ref 70–99)
Glucose-Capillary: 193 mg/dL — ABNORMAL HIGH (ref 70–99)

## 2024-03-20 LAB — PHOSPHORUS: Phosphorus: 2.6 mg/dL (ref 2.5–4.6)

## 2024-03-20 LAB — URIC ACID, RANDOM URINE: Uric Acid, Urine: 20.9 mg/dL

## 2024-03-20 LAB — MAGNESIUM: Magnesium: 1.9 mg/dL (ref 1.7–2.4)

## 2024-03-20 MED ORDER — INSULIN ASPART 100 UNIT/ML IJ SOLN
5.0000 [IU] | Freq: Once | INTRAMUSCULAR | Status: AC
  Administered 2024-03-20: 5 [IU] via SUBCUTANEOUS
  Filled 2024-03-20: qty 5

## 2024-03-20 MED ORDER — INSULIN ASPART 100 UNIT/ML IJ SOLN
5.0000 [IU] | Freq: Three times a day (TID) | INTRAMUSCULAR | Status: DC
  Filled 2024-03-20: qty 5

## 2024-03-20 MED ORDER — DEXTROSE 50 % IV SOLN: 1.0000 | INTRAVENOUS | Status: DC | PRN

## 2024-03-20 MED ORDER — IOHEXOL 300 MG/ML  SOLN
80.0000 mL | Freq: Once | INTRAMUSCULAR | Status: AC | PRN
  Administered 2024-03-20: 80 mL via INTRAVENOUS

## 2024-03-20 MED ORDER — INSULIN GLARGINE-YFGN 100 UNIT/ML ~~LOC~~ SOPN: 5.0000 [IU] | PEN_INJECTOR | Freq: Every day | SUBCUTANEOUS | Status: DC

## 2024-03-20 MED ORDER — TRAZODONE HCL 50 MG PO TABS: 50.0000 mg | ORAL_TABLET | Freq: Every evening | ORAL | Status: DC | PRN

## 2024-03-20 MED ORDER — IOHEXOL 9 MG/ML PO SOLN
500.0000 mL | ORAL | Status: AC
  Administered 2024-03-20: 500 mL via ORAL

## 2024-03-20 MED ORDER — INSULIN GLARGINE-YFGN 100 UNIT/ML ~~LOC~~ SOLN
5.0000 [IU] | Freq: Every day | SUBCUTANEOUS | Status: DC
  Administered 2024-03-20: 5 [IU] via SUBCUTANEOUS
  Filled 2024-03-20: qty 0.05

## 2024-03-20 MED ORDER — ONDANSETRON HCL 4 MG/2ML IJ SOLN
4.0000 mg | INTRAMUSCULAR | Status: DC | PRN
  Administered 2024-03-20 (×2): 4 mg via INTRAVENOUS
  Filled 2024-03-20 (×2): qty 2

## 2024-03-20 MED ORDER — INSULIN ASPART 100 UNIT/ML IJ SOLN
0.0000 [IU] | Freq: Three times a day (TID) | INTRAMUSCULAR | Status: DC
  Administered 2024-03-20: 8 [IU] via SUBCUTANEOUS
  Administered 2024-03-20: 15 [IU] via SUBCUTANEOUS
  Administered 2024-03-21 (×2): 5 [IU] via SUBCUTANEOUS
  Administered 2024-03-21: 8 [IU] via SUBCUTANEOUS
  Administered 2024-03-22: 15 [IU] via SUBCUTANEOUS
  Administered 2024-03-22 – 2024-03-23 (×3): 3 [IU] via SUBCUTANEOUS
  Administered 2024-03-24: 5 [IU] via SUBCUTANEOUS
  Administered 2024-03-24: 3 [IU] via SUBCUTANEOUS
  Filled 2024-03-20: qty 3
  Filled 2024-03-20: qty 5
  Filled 2024-03-20: qty 8
  Filled 2024-03-20: qty 3
  Filled 2024-03-20: qty 15
  Filled 2024-03-20: qty 3
  Filled 2024-03-20 (×2): qty 5
  Filled 2024-03-20: qty 15
  Filled 2024-03-20: qty 8

## 2024-03-20 MED ORDER — SODIUM CHLORIDE 1 G PO TABS
1.0000 g | ORAL_TABLET | Freq: Three times a day (TID) | ORAL | Status: DC
  Administered 2024-03-20 – 2024-03-22 (×7): 1 g via ORAL
  Filled 2024-03-20 (×7): qty 1

## 2024-03-20 NOTE — Progress Notes (Signed)
 Critical lab called to me - sodium 118 Corrected for Glc it's 123-126 stable  Continue current plan of care

## 2024-03-20 NOTE — Progress Notes (Signed)
 PROGRESS NOTE    Barbara Santos   FMW:[id number] DOB: Feb 20, 1936  DOA: 03/18/2024 Date of Service: 03/20/24 which is hospital day 1  PCP: Edman Marsa PARAS, [place] [facility] course / significant events:   Barbara Santos is a 88 y.o. year old female with medical history of HTN, HLD, T2DM presenting to the ED after a fall.   HPI: Pt states she had a fall when her legs gave out.  She fell outside and tried to break her fall where she ended up hitting her knees and elbows which resulted in a laceration.  She says she did not hit her head or had any loss of consciousness.  She was able to get up but just felt weak.  She denies any palpitations, dizziness or lightheadedness around this time.  Patient reports a good appetite but discussing with son and daughter-in-law they report patient has decreased appetite and has lost weight recently.  It appears patient's metformin  was recently increased on 10/15.   11/25: admitted w/ recurrent fall / weakness and hyponatremia  11/26: no significant improvement in sodium, cortisol normal, suspect SIADH, resumed normal saline, following BMP  11/27: sodium corrected for hyperglycemia 124-126. Glc worsening. Added sliding scale and will get A1C, last A1c not indicative of poor control so will check now in case substantially worse. Glc pm also corrects to 124-126. Fluid restricting, adding salt tabs, eval for malignancy w/ CT C/A/P      Consultants:  none  Procedures/Surgeries: none      ASSESSMENT & PLAN:   Acute on chronic hyponatremia Pseudohyponatremia in setting of hyperglycemia but still corrected sodium remains low  Hypotonic hyponatremia  Baseline Na about 132 No thiazide, normal TSH, no hypervolemia Cortisol level --> WNL Uosm and UNa up = in range for SIADH / reset osmostat  History = concern for tea/toast diet, low sodium intake, low nutrient intake  FE Uric Acid (>12% also points to SIADH) - 16.52% indicative of  SIADH  question paraneoplastic? A1C ~pancreas  Fluid restricting adding salt tabs eval for malignancy w/ CT C/A/P  Fall: Likely mechanical, however unsteadiness may be d/t low sodium.  orthostatic vitals.   PT and OT consulted - recs for SNF rehab    Hypothyroidism:TSH WNL no concern for hypothryoid contributing to hyponatremia Continue home Synthroid    Hyperlipidemia:  Continue home statin  Hypomagnesemia replace   Hypertension:  BP high here Increased home ARB IV hydralazine  prn    Type 2 diabetes mellitus:  Recently increase in metformin  which can have effect on appetite and may have led to decreased oral intake for this patient.  Given A1c recently was less than 8, felt comfortable holding home meds but Glc then significantly higher  Glc monitor here  Added sliding scale and will get A1C, last A1c not indicative of poor control so this level of hyperglycemia is concerns, will check now in case substantially worse, attn on CT to pancreas     Intermittent esential tremors. Continue primidone     underweight based on BMI: Body mass index is 16.25 kg/m.SABRA Significantly low or high BMI is associated with higher medical risk.  Suspect severe malnutrition Underweight - under 18  overweight - 25 to 29 obese - 30 or more Class 1 obesity: BMI of 30.0 to 34 Class 2 obesity: BMI of 35.0 to 39 Class 3 obesity: BMI of 40.0 to 49 Super Morbid Obesity: BMI 50-59 Super-super Morbid Obesity: BMI 60+ Healthy nutrition and physical activity advised as  adjunct to other disease management and risk reduction treatments Dietician consult    DVT prophylaxis: lovenoc IV fluids: normal saline continuous IV fluids  Nutrition: regular (dietician consult given significant underweight)  Central lines / other devices: none  Code Status: DNR ACP documentation reviewed:  none on file in [facility]  TOC needs: TBD Medical barriers to dispo: hyponatermis. Expected medical readiness for discharge  couple days.              Subjective / Brief ROS:  Patient reports feeling tired She is drowsy but awakens to voice, not really answering questions.  Denies CP/SOB.  Pain controlled.    Family Communication: called son, wrong number in chart I removed this for mobile phone. Called to daughter in law left HIPAA compliant voicemail     Objective Findings:  Vitals:   03/20/24 0445 03/20/24 0451 03/20/24 0531 03/20/24 1005  BP: (!) 198/99  (!) 159/64 (!) 165/83  Pulse: 90  94 (!) 101  Resp:    16  Temp: 98 F (36.7 C)   98.9 F (37.2 C)  TempSrc:      SpO2: 98%  97% 97%  Weight:  39 kg    Height:        Intake/Output Summary (Last 24 hours) at 03/20/2024 1516 Last data filed at 03/19/2024 2231 Gross per 24 hour  Intake 849.75 ml  Output --  Net 849.75 ml   Filed Weights   03/18/24 1745 03/20/24 0451  Weight: 39 kg 39 kg    Examination:  Physical Exam Constitutional:      General: She is not in acute distress.    Appearance: She is ill-appearing.  Cardiovascular:     Rate and Rhythm: Normal rate and regular rhythm.  Pulmonary:     Effort: Pulmonary effort is normal.     Breath sounds: Normal breath sounds.  Abdominal:     General: Bowel sounds are normal.     Palpations: Abdomen is soft.  Musculoskeletal:     Right lower leg: No edema.     Left lower leg: No edema.  Skin:    General: Skin is dry.     Coloration: Skin is pale.  Neurological:     General: No focal deficit present.     Mental Status: She is alert and oriented to person, place, and time. Mental status is at baseline.  Psychiatric:        Mood and Affect: Mood normal.        Behavior: Behavior normal.          Scheduled Medications:   cholecalciferol   2,000 Units Oral Daily   dorzolamide -timolol   1 drop Both Eyes BID   enoxaparin  (LOVENOX ) injection  30 mg Subcutaneous Q24H   feeding supplement  237 mL Oral BID BM   insulin  aspart  0-15 Units Subcutaneous TID WC    irbesartan   150 mg Oral Daily   lactose free nutrition  237 mL Oral TID BM   levothyroxine   50 mcg Oral Q0600   magnesium  chloride  1 tablet Oral Daily   multivitamin with minerals  1 tablet Oral Daily   polyethylene glycol  17 g Oral Daily   primidone   25-50 mg Oral QHS   rosuvastatin   5 mg Oral QHS   sodium chloride  flush  3 mL Intravenous Q12H   sodium chloride   1 g Oral TID WC   thiamine   100 mg Oral Daily    Continuous Infusions:    PRN Medications:  acetaminophen  **OR** acetaminophen , bisacodyl , hydrALAZINE , hydrALAZINE , ondansetron  (ZOFRAN ) IV  Antimicrobials from admission:  Anti-infectives (From admission, onward)    None           Data Reviewed:  I have personally reviewed the following...  CBC: Recent Labs  Lab 03/18/24 1842 03/19/24 0604  WBC 8.3 7.8  NEUTROABS 5.4  --   HGB 11.5* 12.0  HCT 33.1* 34.4*  MCV 89.5 88.9  PLT 244 257   Basic Metabolic Panel: Recent Labs  Lab 03/18/24 2320 03/19/24 0604 03/19/24 1756 03/19/24 2235 03/20/24 0521 03/20/24 1127  NA 125* 126* 120* 125* 121* 121*  K  --  3.9 4.4 4.0 3.9 4.2  CL  --  92* 87* 92* 90* 87*  CO2  --  25 22 23  20* 21*  GLUCOSE  --  173* 372* 280* 301* 295*  BUN  --  10 12 13 13 13   CREATININE  --  0.40* 0.46 0.45 0.44 0.49  CALCIUM   --  8.5* 8.9 8.1* 9.0 9.1  MG 1.4*  --   --   --  1.9  --   PHOS  --   --   --   --  2.6  --    GFR: Estimated Creatinine Clearance: 31.1 mL/min (by C-G formula based on SCr of 0.49 mg/dL). Liver Function Tests: Recent Labs  Lab 03/18/24 1842  AST 26  ALT 20  ALKPHOS 73  BILITOT 0.4  PROT 6.6  ALBUMIN 4.4   No results for input(s): LIPASE, AMYLASE in the last 168 hours. No results for input(s): AMMONIA in the last 168 hours. Coagulation Profile: No results for input(s): INR, PROTIME in the last 168 hours. Cardiac Enzymes: No results for input(s): CKTOTAL, CKMB, CKMBINDEX, TROPONINI in the last 168 hours. BNP (last 3  results) No results for input(s): PROBNP in the last 8760 hours. HbA1C: No results for input(s): HGBA1C in the last 72 hours. CBG: Recent Labs  Lab 03/19/24 0723 03/19/24 2113 03/20/24 0950 03/20/24 1202  GLUCAP 162* 317* 267* 275*   Lipid Profile: No results for input(s): CHOL, HDL, LDLCALC, TRIG, CHOLHDL, LDLDIRECT in the last 72 hours. Thyroid  Function Tests: Recent Labs    03/18/24 2320  TSH 0.771   Anemia Panel: No results for input(s): VITAMINB12, FOLATE, FERRITIN, TIBC, IRON, RETICCTPCT in the last 72 hours. Most Recent Urinalysis On File:     Component Value Date/Time   COLORURINE YELLOW (A) 03/18/2024 1922   APPEARANCEUR CLEAR (A) 03/18/2024 1922   LABSPEC 1.014 03/18/2024 1922   PHURINE 7.0 03/18/2024 1922   GLUCOSEU 150 (A) 03/18/2024 1922   HGBUR NEGATIVE 03/18/2024 1922   BILIRUBINUR NEGATIVE 03/18/2024 1922   BILIRUBINUR Negative 01/07/2020 0933   KETONESUR NEGATIVE 03/18/2024 1922   PROTEINUR NEGATIVE 03/18/2024 1922   UROBILINOGEN 0.2 01/07/2020 0933   NITRITE NEGATIVE 03/18/2024 1922   LEUKOCYTESUR TRACE (A) 03/18/2024 1922   Sepsis Labs: @LABRCNTIP (procalcitonin:4,lacticidven:4) Microbiology: No results found for this or any previous visit (from the past 240 hours).    Radiology Studies last 3 days: CT Cervical Spine Wo Contrast Result Date: 03/18/2024 EXAM: CT CERVICAL SPINE WITHOUT CONTRAST 03/18/2024 07:00:54 PM TECHNIQUE: CT of the cervical spine was performed without the administration of intravenous contrast. Multiplanar reformatted images are provided for review. Automated exposure control, iterative reconstruction, and/or weight based adjustment of the mA/kV was utilized to reduce the radiation dose to as low as reasonably achievable. COMPARISON: None available. CLINICAL HISTORY: Neck trauma (Age >= 65y) FINDINGS: CERVICAL SPINE: BONES  AND ALIGNMENT: Straightening of the normal cervical lordosis. No evidence of  traumatic malalignment. No acute fracture. DEGENERATIVE CHANGES: Disc space narrowing most pronounced at C4-C5, C6-C7, and C7-T1. Degenerative endplate osteophytes at multiple levels. There is no high grade osseous spinal canal stenosis. Facet arthrosis and no facet hypertrophy at multiple levels. Foraminal stenosis most pronounced at C5-C6. SOFT TISSUES: No prevertebral soft tissue swelling. LUNGS: Biapical pleural parenchymal scarring. IMPRESSION: 1. No acute abnormality of the cervical spine related to the reported neck trauma. 2. Degenerative changes as above. Electronically signed by: Donnice Mania MD 03/18/2024 07:31 PM EST RP Workstation: [id number]   CT Head Wo Contrast Result Date: 03/18/2024 EXAM: CT HEAD WITHOUT 03/18/2024 07:00:54 PM TECHNIQUE: CT of the head was performed without the administration of intravenous contrast. Automated exposure control, iterative reconstruction, and/or weight based adjustment of the mA/kV was utilized to reduce the radiation dose to as low as reasonably achievable. COMPARISON: None available. CLINICAL HISTORY: Head trauma, minor (Age >= 65y) FINDINGS: BRAIN AND VENTRICLES: Age related atrophy. No acute intracranial hemorrhage. No mass effect or midline shift. No extra-axial fluid collection. No evidence of acute infarct. No hydrocephalus. ORBITS: Bilateral cataract resection. No acute abnormality. SINUSES AND MASTOIDS: No acute abnormality. SOFT TISSUES AND SKULL: Atherosclerosis of skullbase vasculature without hyperdense vessel or abnormal calcification. No acute skull fracture. No acute soft tissue abnormality. IMPRESSION: 1. No acute intracranial abnormality related to head trauma. Electronically signed by: Donnice Mania MD 03/18/2024 07:26 PM EST RP Workstation: [id number]   DG Knee Complete 4 Views Left Result Date: 03/18/2024 EXAM: 4 OR MORE VIEW(S) Xray of the Left Knee 03/18/2024 06:05:29 PM COMPARISON: None available. CLINICAL HISTORY: fall fall FINDINGS:  BONES AND JOINTS: No acute fracture. No focal osseous lesion. No joint dislocation. No significant joint effusion. No significant degenerative changes. SOFT TISSUES: The soft tissues are unremarkable. IMPRESSION: 1. No significant abnormality. Electronically signed by: Elsie Gravely MD 03/18/2024 06:26 PM EST RP Workstation: [id number]   DG Elbow Complete Left Result Date: 03/18/2024 EXAM: 3 VIEW(S) XRAY OF THE LEFT ELBOW COMPARISON: None available. CLINICAL HISTORY: Fall Fall FINDINGS: BONES AND JOINTS: No acute fracture. No focal osseous lesion. No joint dislocation. No joint effusion. SOFT TISSUES: The soft tissues are unremarkable. IMPRESSION: 1. No acute abnormality. Electronically signed by: Elsie Gravely MD 03/18/2024 06:25 PM EST RP Workstation: [id number]       Time spent: 50 min     Laneta Blunt, DO [facility] 03/20/2024, 3:16 PM    Dictation software may have been used to generate the above note. Typos may occur and escape review in typed/dictated notes. Please contact Dr Blunt directly for clarity if needed.  Staff may message me via secure chat in Epic  but this may not receive an immediate response,  please page me for urgent matters!  If 7PM-7AM, please contact night coverage [email or web address]

## 2024-03-20 NOTE — Plan of Care (Signed)

## 2024-03-20 NOTE — Plan of Care (Signed)
  Problem: Education: Goal: Knowledge of General Education information will improve Description: Including pain rating scale, medication(s)/side effects and non-pharmacologic comfort measures Outcome: Progressing   Problem: Clinical Measurements: Goal: Ability to maintain clinical measurements within normal limits will improve Outcome: Progressing Goal: Will remain free from infection Outcome: Progressing Goal: Diagnostic test results will improve Outcome: Progressing Goal: Respiratory complications will improve Outcome: Progressing Goal: Cardiovascular complication will be avoided Outcome: Progressing   Problem: Activity: Goal: Risk for activity intolerance will decrease Outcome: Progressing   Problem: Nutrition: Goal: Adequate nutrition will be maintained Outcome: Progressing   Problem: Coping: Goal: Level of anxiety will decrease Outcome: Progressing   Problem: Elimination: Goal: Will not experience complications related to bowel motility Outcome: Progressing Goal: Will not experience complications related to urinary retention Outcome: Progressing   Problem: Safety: Goal: Ability to remain free from injury will improve Outcome: Progressing   Problem: Skin Integrity: Goal: Risk for impaired skin integrity will decrease Outcome: Progressing   

## 2024-03-21 DIAGNOSIS — E871 Hypo-osmolality and hyponatremia: Secondary | ICD-10-CM | POA: Diagnosis not present

## 2024-03-21 LAB — HEMOGLOBIN A1C
Hgb A1c MFr Bld: 8 % — ABNORMAL HIGH (ref 4.8–5.6)
Mean Plasma Glucose: 183 mg/dL

## 2024-03-21 LAB — BASIC METABOLIC PANEL WITH GFR
Anion gap: 8 (ref 5–15)
Anion gap: 10 (ref 5–15)
Anion gap: 10 (ref 5–15)
BUN: 22 mg/dL (ref 8–23)
BUN: 18 mg/dL (ref 8–23)
BUN: 16 mg/dL (ref 8–23)
CO2: 24 mmol/L (ref 22–32)
CO2: 24 mmol/L (ref 22–32)
CO2: 24 mmol/L (ref 22–32)
Calcium: 8.8 mg/dL — ABNORMAL LOW (ref 8.9–10.3)
Calcium: 8.9 mg/dL (ref 8.9–10.3)
Calcium: 9 mg/dL (ref 8.9–10.3)
Chloride: 92 mmol/L — ABNORMAL LOW (ref 98–111)
Chloride: 92 mmol/L — ABNORMAL LOW (ref 98–111)
Chloride: 93 mmol/L — ABNORMAL LOW (ref 98–111)
Creatinine, Ser: 0.56 mg/dL (ref 0.44–1.00)
Creatinine, Ser: 0.55 mg/dL (ref 0.44–1.00)
Creatinine, Ser: 0.45 mg/dL (ref 0.44–1.00)
GFR, Estimated: >60 mL/min (ref >60)
GFR, Estimated: >60 mL/min (ref >60)
GFR, Estimated: >60 mL/min (ref >60)
Glucose, Bld: 216 mg/dL — ABNORMAL HIGH (ref 70–99)
Glucose, Bld: 274 mg/dL — ABNORMAL HIGH (ref 70–99)
Glucose, Bld: 294 mg/dL — ABNORMAL HIGH (ref 70–99)
Potassium: 4.5 mmol/L (ref 3.5–5.1)
Potassium: 3.8 mmol/L (ref 3.5–5.1)
Potassium: 4.1 mmol/L (ref 3.5–5.1)
Sodium: 123 mmol/L — ABNORMAL LOW (ref 135–145)
Sodium: 125 mmol/L — ABNORMAL LOW (ref 135–145)
Sodium: 127 mmol/L — ABNORMAL LOW (ref 135–145)

## 2024-03-21 LAB — GLUCOSE, CAPILLARY
Glucose-Capillary: 220 mg/dL — ABNORMAL HIGH (ref 70–99)
Glucose-Capillary: 278 mg/dL — ABNORMAL HIGH (ref 70–99)
Glucose-Capillary: 208 mg/dL — ABNORMAL HIGH (ref 70–99)
Glucose-Capillary: 202 mg/dL — ABNORMAL HIGH (ref 70–99)

## 2024-03-21 LAB — PHOSPHORUS: Phosphorus: 2.7 mg/dL (ref 2.5–4.6)

## 2024-03-21 LAB — MAGNESIUM: Magnesium: 1.8 mg/dL (ref 1.7–2.4)

## 2024-03-21 MED ORDER — INSULIN GLARGINE-YFGN 100 UNIT/ML ~~LOC~~ SOLN
10.0000 [IU] | Freq: Every day | SUBCUTANEOUS | Status: DC
  Administered 2024-03-21 – 2024-03-23 (×3): 10 [IU] via SUBCUTANEOUS
  Filled 2024-03-21 (×5): qty 0.1

## 2024-03-21 MED ORDER — INSULIN ASPART 100 UNIT/ML IJ SOLN
7.0000 [IU] | Freq: Three times a day (TID) | INTRAMUSCULAR | Status: DC
  Administered 2024-03-21 – 2024-03-22 (×5): 7 [IU] via SUBCUTANEOUS
  Filled 2024-03-21 (×5): qty 7

## 2024-03-21 NOTE — Progress Notes (Signed)
 PROGRESS NOTE    Barbara Santos   FMW:[id number] DOB: [birth year]  DOA: 03/18/2024 Date of Service: 03/21/24 which is hospital day 2  PCP: Edman Marsa PARAS, [facility] course / significant events:   Barbara Santos is a 88 y.o. year old female with medical history of HTN, HLD, T2DM presenting to the ED after a fall.   HPI: Pt states she had a fall when her legs gave out.  She fell outside and tried to break her fall where she ended up hitting her knees and elbows which resulted in a laceration.  She says she did not hit her head or had any loss of consciousness.  She was able to get up but just felt weak.  She denies any palpitations, dizziness or lightheadedness around this time.  Patient reports a good appetite but discussing with son and daughter-in-law they report patient has decreased appetite and has lost weight recently.  It appears patient's metformin  was recently increased on 10/15.   11/25: admitted w/ recurrent fall / weakness and hyponatremia  11/26: no significant improvement in sodium, cortisol normal, suspect SIADH, resumed normal saline, following BMP  11/27: sodium corrected for hyperglycemia 124-126. Glc worsening. Added sliding scale and will get A1C, last A1c not indicative of poor control so will check now in case substantially worse. Glc pm also corrects to 124-126. Fluid restricting, adding salt tabs, eval for malignancy w/ CT C/A/P 11/28: remains hyperglycemic but some improved. Sodium continues to correct to about 125-126. CT thankfully no malignancy, she will need follow up lung nodules, question if this + emphysema explains SIADH. A1C still not resulted. Sodium this afternoon corrected 128-129      Consultants:  none  Procedures/Surgeries: none      ASSESSMENT & PLAN:   Acute on chronic hyponatremia Pseudohyponatremia in setting of hyperglycemia but still corrected sodium remains low  Hypotonic hyponatremia  Baseline Na about  132 No thiazide, normal TSH, no hypervolemia Cortisol level --> WNL Uosm and UNa up = in range for SIADH / reset osmostat  History = concern for tea/toast diet, low sodium intake, low nutrient intake  FE Uric Acid (>12% also points to SIADH) - 16.52% indicative of SIADH  question paraneoplastic? --> CT C/A/P no evidence for malignancy but did show lung nodules and emphysema see below Fluid restricting continue salt tabs May benefit from outpatient endocrinology  Significant imaging: CT C/A/P no evidence for malignancy but did show lung nodules and emphysema, question if this explains the SIADH Outpatient follow up Will need repeat CT chest to follow nodules  Type 2 diabetes mellitus:  Recently increase in metformin  which can have effect on appetite and may have led to decreased oral intake for this patient.  Given A1c recently was less than 8, felt comfortable holding home meds but Glc then significantly higher  Added sliding scale and will get A1C, last A1c not indicative of poor control so this level of hyperglycemia is concerns, will check now in case substantially worse, attn on CT to pancreas   Increasing insulin  mealtime / basal May benefit from outpatient endocrinology  Fall: Likely mechanical, however unsteadiness may be d/t low sodium.  orthostatic vitals.   PT and OT consulted - recs for SNF rehab    Hypothyroidism:TSH WNL no concern for hypothryoid contributing to hyponatremia Continue home Synthroid    Hyperlipidemia:  Continue home statin  Hypomagnesemia replace   Hypertension:  BP high here Increased home ARB IV hydralazine  prn  Intermittent esential tremors. Continue primidone    Cholelithiasis without acute cholecystitis, and Renal cyst on CT - not clinically significant at this time No follow up needed   underweight based on BMI: Body mass index is 16.25 kg/m.SABRA Significantly low or high BMI is associated with higher medical risk.  Suspect severe  malnutrition Underweight - under 18  overweight - 25 to 29 obese - 30 or more Class 1 obesity: BMI of 30.0 to 34 Class 2 obesity: BMI of 35.0 to 39 Class 3 obesity: BMI of 40.0 to 49 Super Morbid Obesity: BMI 50-59 Super-super Morbid Obesity: BMI 60+ Healthy nutrition and physical activity advised as adjunct to other disease management and risk reduction treatments Dietician consult    DVT prophylaxis: lovenox  IV fluids: normal saline continuous IV fluids  Nutrition: regular (dietician consult given significant underweight) w/ fluid restrict  Central lines / other devices: none  Code Status: DNR ACP documentation reviewed:  none on file in [facility]  TOC needs: TBD Medical barriers to dispo: hyponatermia. Expected medical readiness for discharge tomorrow.              Subjective / Brief ROS:  Patient reports feeling tired Alert and conversational today and yeterday evening  answering questions.  Denies CP/SOB.  Pain controlled.    Family Communication: spoke w/ son and patient at length at bedside yesterday evening, son is also present today on rounds, all questions answered, very pleasant     Objective Findings:  Vitals:   03/20/24 1005 03/20/24 1951 03/21/24 0425 03/21/24 0537  BP: (!) 165/83 (!) 103/53 132/63   Pulse: (!) 101 78 84   Resp: 16 17 16    Temp: 98.9 F (37.2 C) 98.2 F (36.8 C) 98.5 F (36.9 C)   TempSrc:      SpO2: 97% 95% 97%   Weight:    40.5 kg  Height:        Intake/Output Summary (Last 24 hours) at 03/21/2024 1424 Last data filed at 03/21/2024 1358 Gross per 24 hour  Intake 640 ml  Output --  Net 640 ml   Filed Weights   03/18/24 1745 03/20/24 0451 03/21/24 0537  Weight: 39 kg 39 kg 40.5 kg    Examination:  Physical Exam Constitutional:      General: She is not in acute distress.    Appearance: She is ill-appearing.  Cardiovascular:     Rate and Rhythm: Normal rate and regular rhythm.  Pulmonary:     Effort:  Pulmonary effort is normal.     Breath sounds: Normal breath sounds.  Abdominal:     General: Bowel sounds are normal.     Palpations: Abdomen is soft.  Musculoskeletal:     Right lower leg: No edema.     Left lower leg: No edema.  Skin:    General: Skin is dry.     Coloration: Skin is pale.  Neurological:     General: No focal deficit present.     Mental Status: She is alert and oriented to person, place, and time. Mental status is at baseline.  Psychiatric:        Mood and Affect: Mood normal.        Behavior: Behavior normal.          Scheduled Medications:   cholecalciferol   2,000 Units Oral Daily   dorzolamide -timolol   1 drop Both Eyes BID   enoxaparin  (LOVENOX ) injection  30 mg Subcutaneous Q24H   feeding supplement  237 mL Oral BID BM  insulin  aspart  0-15 Units Subcutaneous TID WC   insulin  aspart  7 Units Subcutaneous TID WC   insulin  glargine-yfgn  10 Units Subcutaneous Q2200   irbesartan   150 mg Oral Daily   lactose free nutrition  237 mL Oral TID BM   levothyroxine   50 mcg Oral Q0600   magnesium  chloride  1 tablet Oral Daily   multivitamin with minerals  1 tablet Oral Daily   polyethylene glycol  17 g Oral Daily   primidone   25-50 mg Oral QHS   rosuvastatin   5 mg Oral QHS   sodium chloride  flush  3 mL Intravenous Q12H   sodium chloride   1 g Oral TID WC   thiamine   100 mg Oral Daily    Continuous Infusions:    PRN Medications:  acetaminophen  **OR** acetaminophen , bisacodyl , dextrose , hydrALAZINE , hydrALAZINE , ondansetron  (ZOFRAN ) IV, traZODone   Antimicrobials from admission:  Anti-infectives (From admission, onward)    None           Data Reviewed:  I have personally reviewed the following...  CBC: Recent Labs  Lab 03/18/24 1842 03/19/24 0604  WBC 8.3 7.8  NEUTROABS 5.4  --   HGB 11.5* 12.0  HCT 33.1* 34.4*  MCV 89.5 88.9  PLT 244 257   Basic Metabolic Panel: Recent Labs  Lab 03/18/24 2320 03/19/24 0604 03/20/24 0521  03/20/24 1127 03/20/24 1701 03/20/24 2241 03/21/24 0405 03/21/24 1029  NA 125*   < > 121* 121* 118* 122* 123* 125*  K  --    < > 3.9 4.2 4.7 4.6 4.5 3.8  CL  --    < > 90* 87* 85* 90* 92* 92*  CO2  --    < > 20* 21* 24 24 24 24   GLUCOSE  --    < > 301* 295* 436* 260* 216* 274*  BUN  --    < > 13 13 16  24* 22 18  CREATININE  --    < > 0.44 0.49 0.78 0.79 0.56 0.55  CALCIUM   --    < > 9.0 9.1 8.9 8.8* 8.8* 8.9  MG 1.4*  --  1.9  --   --   --  1.8  --   PHOS  --   --  2.6  --   --   --  2.7  --    < > = values in this interval not displayed.   GFR: Estimated Creatinine Clearance: 32.3 mL/min (by C-G formula based on SCr of 0.55 mg/dL). Liver Function Tests: Recent Labs  Lab 03/18/24 1842  AST 26  ALT 20  ALKPHOS 73  BILITOT 0.4  PROT 6.6  ALBUMIN 4.4   No results for input(s): LIPASE, AMYLASE in the last 168 hours. No results for input(s): AMMONIA in the last 168 hours. Coagulation Profile: No results for input(s): INR, PROTIME in the last 168 hours. Cardiac Enzymes: No results for input(s): CKTOTAL, CKMB, CKMBINDEX, TROPONINI in the last 168 hours. BNP (last 3 results) No results for input(s): PROBNP in the last 8760 hours. HbA1C: No results for input(s): HGBA1C in the last 72 hours. CBG: Recent Labs  Lab 03/20/24 1202 03/20/24 1638 03/20/24 2112 03/21/24 0824 03/21/24 1202  GLUCAP 275* 443* 193* 220* 278*   Lipid Profile: No results for input(s): CHOL, HDL, LDLCALC, TRIG, CHOLHDL, LDLDIRECT in the last 72 hours. Thyroid  Function Tests: Recent Labs    03/18/24 2320  TSH 0.771   Anemia Panel: No results for input(s): VITAMINB12, FOLATE, FERRITIN, TIBC, IRON,  RETICCTPCT in the last 72 hours. Most Recent Urinalysis On File:     Component Value Date/Time   COLORURINE YELLOW (A) 03/18/2024 1922   APPEARANCEUR CLEAR (A) 03/18/2024 1922   LABSPEC 1.014 03/18/2024 1922   PHURINE 7.0 03/18/2024 1922   GLUCOSEU 150  (A) 03/18/2024 1922   HGBUR NEGATIVE 03/18/2024 1922   BILIRUBINUR NEGATIVE 03/18/2024 1922   BILIRUBINUR Negative 01/07/2020 0933   KETONESUR NEGATIVE 03/18/2024 1922   PROTEINUR NEGATIVE 03/18/2024 1922   UROBILINOGEN 0.2 01/07/2020 0933   NITRITE NEGATIVE 03/18/2024 1922   LEUKOCYTESUR TRACE (A) 03/18/2024 1922   Sepsis Labs: @LABRCNTIP (procalcitonin:4,lacticidven:4) Microbiology: No results found for this or any previous visit (from the past 240 hours).    Radiology Studies last 3 days: CT CHEST ABDOMEN PELVIS W CONTRAST Result Date: 03/20/2024 EXAM: CT CHEST, ABDOMEN AND PELVIS WITH CONTRAST 03/20/2024 06:07:06 PM TECHNIQUE: CT of the chest, abdomen and pelvis was performed with the administration of 80 mL of iohexol  (OMNIPAQUE ) 300 MG/ML solution. Multiplanar reformatted images are provided for review. Automated exposure control, iterative reconstruction, and/or weight based adjustment of the mA/kV was utilized to reduce the radiation dose to as low as reasonably achievable. COMPARISON: Ultrasound pelvis 05/12/2014. CLINICAL HISTORY: Metastatic disease evaluation. FINDINGS: CHEST: MEDIASTINUM AND LYMPH NODES: Heart and pericardium are unremarkable. The central airways are clear. No mediastinal, hilar or axillary lymphadenopathy. LUNGS AND PLEURA: Scarring in the lung apices. Mild emphysematous changes in the lungs with scattered interstitial fibrosis. Scattered pulmonary nodules are present, most prominent in the right apex, image 21, measuring 6 mm in diameter and in the right middle lung, image 70, measuring 2.5 mm diameter. No pleural effusion or pneumothorax. ABDOMEN AND PELVIS: LIVER: Diffuse fatty infiltration of the liver. Subcentimeter low attenuation lesion in the lateral segment of the left lobe is too small to characterize but likely represents a small cyst or hemangioma. GALLBLADDER AND BILE DUCTS: Cholelithiasis with small stones in the gallbladder. No wall thickening or  inflammatory stranding. No biliary ductal dilatation. SPLEEN: No acute abnormality. PANCREAS: No acute abnormality. ADRENAL GLANDS: No acute abnormality. KIDNEYS, URETERS AND BLADDER: 6 cm simple appearing cyst in the lower pole of the right kidney. No imaging follow-up is indicated. No stones in the kidneys or ureters. No hydronephrosis. No perinephric or periureteral stranding. Urinary bladder is unremarkable. GI AND BOWEL: Stool-filled colon. The colon is not abnormally distended. No wall thickening or inflammatory stranding is identified. Stomach and small bowel demonstrate no acute abnormality. There is no bowel obstruction. REPRODUCTIVE ORGANS: No acute abnormality. PERITONEUM AND RETROPERITONEUM: No ascites. No free air. No mesenteric mass or collection is seen. VASCULATURE: Calcification of the aorta. No aneurysm or dissection. ABDOMINAL AND PELVIS LYMPH NODES: No lymphadenopathy. BONES AND SOFT TISSUES: Degenerative changes in the spine and hips. Old left rib fractures. No focal bone lesions. No focal soft tissue abnormality. IMPRESSION: 1. No evidence of metastatic disease. 2. Scattered pulmonary nodules measuring up to 6 mm in the right apex and 2.5 mm in the right middle lobe. Given oncologic indication and incomplete applicability of Fleischner guidelines, recommend non-contrast chest CT in 612 months to assess stability, with consideration of PET/CT or tissue sampling if interval growth or suspicious features develop. 3. Mild emphysema, which is an independent lung cancer risk factor. For patients aged 86, consider evaluation for a low-dose CT lung cancer screening program. 4. Cholelithiasis without acute cholecystitis. Electronically signed by: Elsie Gravely MD 03/20/2024 06:25 PM EST RP Workstation: [id number]   CT Cervical Spine Wo Contrast  Result Date: 03/18/2024 EXAM: CT CERVICAL SPINE WITHOUT CONTRAST 03/18/2024 07:00:54 PM TECHNIQUE: CT of the cervical spine was performed without the  administration of intravenous contrast. Multiplanar reformatted images are provided for review. Automated exposure control, iterative reconstruction, and/or weight based adjustment of the mA/kV was utilized to reduce the radiation dose to as low as reasonably achievable. COMPARISON: None available. CLINICAL HISTORY: Neck trauma (Age >= 65y) FINDINGS: CERVICAL SPINE: BONES AND ALIGNMENT: Straightening of the normal cervical lordosis. No evidence of traumatic malalignment. No acute fracture. DEGENERATIVE CHANGES: Disc space narrowing most pronounced at C4-C5, C6-C7, and C7-T1. Degenerative endplate osteophytes at multiple levels. There is no high grade osseous spinal canal stenosis. Facet arthrosis and no facet hypertrophy at multiple levels. Foraminal stenosis most pronounced at C5-C6. SOFT TISSUES: No prevertebral soft tissue swelling. LUNGS: Biapical pleural parenchymal scarring. IMPRESSION: 1. No acute abnormality of the cervical spine related to the reported neck trauma. 2. Degenerative changes as above. Electronically signed by: Donnice Mania MD 03/18/2024 07:31 PM EST RP Workstation: [id number]   CT Head Wo Contrast Result Date: 03/18/2024 EXAM: CT HEAD WITHOUT 03/18/2024 07:00:54 PM TECHNIQUE: CT of the head was performed without the administration of intravenous contrast. Automated exposure control, iterative reconstruction, and/or weight based adjustment of the mA/kV was utilized to reduce the radiation dose to as low as reasonably achievable. COMPARISON: None available. CLINICAL HISTORY: Head trauma, minor (Age >= 65y) FINDINGS: BRAIN AND VENTRICLES: Age related atrophy. No acute intracranial hemorrhage. No mass effect or midline shift. No extra-axial fluid collection. No evidence of acute infarct. No hydrocephalus. ORBITS: Bilateral cataract resection. No acute abnormality. SINUSES AND MASTOIDS: No acute abnormality. SOFT TISSUES AND SKULL: Atherosclerosis of skullbase vasculature without hyperdense  vessel or abnormal calcification. No acute skull fracture. No acute soft tissue abnormality. IMPRESSION: 1. No acute intracranial abnormality related to head trauma. Electronically signed by: Donnice Mania MD 03/18/2024 07:26 PM EST RP Workstation: [id number]   DG Knee Complete 4 Views Left Result Date: 03/18/2024 EXAM: 4 OR MORE VIEW(S) Xray of the Left Knee 03/18/2024 06:05:29 PM COMPARISON: None available. CLINICAL HISTORY: fall fall FINDINGS: BONES AND JOINTS: No acute fracture. No focal osseous lesion. No joint dislocation. No significant joint effusion. No significant degenerative changes. SOFT TISSUES: The soft tissues are unremarkable. IMPRESSION: 1. No significant abnormality. Electronically signed by: Elsie Gravely MD 03/18/2024 06:26 PM EST RP Workstation: [id number]   DG Elbow Complete Left Result Date: 03/18/2024 EXAM: 3 VIEW(S) XRAY OF THE LEFT ELBOW COMPARISON: None available. CLINICAL HISTORY: Fall Fall FINDINGS: BONES AND JOINTS: No acute fracture. No focal osseous lesion. No joint dislocation. No joint effusion. SOFT TISSUES: The soft tissues are unremarkable. IMPRESSION: 1. No acute abnormality. Electronically signed by: Elsie Gravely MD 03/18/2024 06:25 PM EST RP Workstation: [id number]       Time spent: 50 min     Laneta Blunt, DO [facility] 03/21/2024, 2:24 PM    Dictation software may have been used to generate the above note. Typos may occur and escape review in typed/dictated notes. Please contact Dr Blunt directly for clarity if needed.  Staff may message me via secure chat in Epic  but this may not receive an immediate response,  please page me for urgent matters!  If 7PM-7AM, please contact night coverage [email or web address]

## 2024-03-21 NOTE — Plan of Care (Signed)
  Problem: Education: Goal: Knowledge of General Education information will improve Description: Including pain rating scale, medication(s)/side effects and non-pharmacologic comfort measures Outcome: Progressing   Problem: Clinical Measurements: Goal: Ability to maintain clinical measurements within normal limits will improve Outcome: Progressing Goal: Will remain free from infection Outcome: Progressing Goal: Diagnostic test results will improve Outcome: Progressing Goal: Respiratory complications will improve Outcome: Progressing Goal: Cardiovascular complication will be avoided Outcome: Progressing   Problem: Activity: Goal: Risk for activity intolerance will decrease Outcome: Progressing   Problem: Nutrition: Goal: Adequate nutrition will be maintained Outcome: Progressing   Problem: Coping: Goal: Level of anxiety will decrease Outcome: Progressing   Problem: Elimination: Goal: Will not experience complications related to bowel motility Outcome: Progressing Goal: Will not experience complications related to urinary retention Outcome: Progressing   Problem: Safety: Goal: Ability to remain free from injury will improve Outcome: Progressing   Problem: Skin Integrity: Goal: Risk for impaired skin integrity will decrease Outcome: Progressing   

## 2024-03-21 NOTE — Plan of Care (Signed)
  Problem: Education: Goal: Knowledge of General Education information will improve Description: Including pain rating scale, medication(s)/side effects and non-pharmacologic comfort measures Outcome: Progressing   Problem: Clinical Measurements: Goal: Will remain free from infection Outcome: Progressing Goal: Diagnostic test results will improve Outcome: Progressing Goal: Cardiovascular complication will be avoided Outcome: Progressing   Problem: Activity: Goal: Risk for activity intolerance will decrease Outcome: Progressing   Problem: Nutrition: Goal: Adequate nutrition will be maintained Outcome: Progressing   Problem: Coping: Goal: Level of anxiety will decrease Outcome: Progressing   Problem: Pain Managment: Goal: General experience of comfort will improve and/or be controlled Outcome: Progressing   Problem: Safety: Goal: Ability to remain free from injury will improve Outcome: Progressing   Problem: Nutritional: Goal: Progress toward achieving an optimal weight will improve Outcome: Progressing   Problem: Skin Integrity: Goal: Risk for impaired skin integrity will decrease Outcome: Progressing   Problem: Tissue Perfusion: Goal: Adequacy of tissue perfusion will improve Outcome: Progressing

## 2024-03-21 NOTE — Progress Notes (Signed)
 Mobility Specialist - Progress Note    03/21/24 1603  Therapy Vitals  Temp 97.9 F (36.6 C)  Pulse Rate 83  BP (!) 142/71  Patient Position (if appropriate) Sitting  Oxygen Therapy  SpO2 98 %  O2 Device Room Air  Mobility  Activity Ambulated with assistance;Stood at bedside;Dangled on edge of bed  Level of Assistance Contact guard assist, steadying assist  Assistive Device Front wheel walker  Distance Ambulated (ft) 180 ft  Range of Motion/Exercises Active;All extremities  Activity Response Tolerated well  Mobility visit 1 Mobility  Mobility Specialist Start Time (ACUTE ONLY) 1435  Mobility Specialist Stop Time (ACUTE ONLY) 1449  Mobility Specialist Time Calculation (min) (ACUTE ONLY) 14 min   Pt was supine in bed on RA and guest in the room upon entry. Pt agreed to mobility. Pt request during this activity to use 2 WW for safety. Pt is able to get to the EOB independently with bed features. Pt is able to STS independently with 2 WW. Pt ambulated well. Pt did not need any recovery breaks throughout the activity. After activity pt returned to the room to the EOB. OT was in the room upon exit.  Clem Rodes Mobility Specialist 03/21/24, 4:30 PM

## 2024-03-21 NOTE — Care Management Important Message (Signed)
 Important Message  Patient Details  Name: Barbara Santos MRN: [id number] Date of Birth: [birth year]   Important Message Given:  Yes - Medicare IM     [name] 03/21/2024, 5:10 PM

## 2024-03-21 NOTE — [place] FL2 (Signed)
   MEDICAID [id number] LEVEL OF CARE FORM     IDENTIFICATION  Patient Name: Barbara Santos Birthdate: [birth year] Sex: female Admission Date (Current Location): 03/18/2024  [facility] and [place] Number:  Chiropodist and Address:         Provider Number: [id number]  Attending Physician Name and Address:  Marsa Edelman, DO  Relative Name and Phone Number:       Current Level of Care: Hospital Recommended Level of Care: Skilled Nursing Facility Prior Approval Number:    Date Approved/Denied:   PASRR Number: [id number]  Discharge Plan: SNF    Current Diagnoses: Patient Active Problem List   Diagnosis Date Noted   Hyponatremia 03/18/2024   Constipation 09/19/2018   Essential hypertension 09/19/2018   Excessive gas 10/31/2016   Protein calorie malnutrition 10/31/2016   Hyperlipidemia 10/31/2016   Type II diabetes mellitus (HCC) 01/05/2015   Hypothyroidism 01/05/2015   Serum calcium  elevated 01/05/2015    Orientation RESPIRATION BLADDER Height & Weight     Self, Time, Situation, Place  Normal Continent Weight: 40.5 kg Height:  5' 1 (154.9 cm)  BEHAVIORAL SYMPTOMS/MOOD NEUROLOGICAL BOWEL NUTRITION STATUS      Continent Diet (Carb modified)  AMBULATORY STATUS COMMUNICATION OF NEEDS Skin   Limited Assist Verbally Other (Comment) (traumatic injury to arm with stitchs)                       Personal Care Assistance Level of Assistance              Functional Limitations Info             SPECIAL CARE FACTORS FREQUENCY  PT (By licensed PT), OT (By licensed OT)                    Contractures Contractures Info: Not present    Additional Factors Info  Code Status, Allergies Code Status Info: DNR Allergies Info: Amoxicillin, Penicillins           Current Medications (03/21/2024):  This is the current hospital active medication list Current Facility-Administered Medications  Medication Dose Route Frequency Provider  Last Rate Last Admin   acetaminophen  (TYLENOL ) tablet 650 mg  650 mg Oral Q6H PRN Fernand Prost, MD       Or   acetaminophen  (TYLENOL ) suppository 650 mg  650 mg Rectal Q6H PRN Fernand Prost, MD       bisacodyl  (DULCOLAX) suppository 10 mg  10 mg Rectal Daily PRN Alexander, Natalie, DO   10 mg at 03/19/24 2241   cholecalciferol  (VITAMIN D3) tablet 2,000 Units  2,000 Units Oral Daily Fernand Prost, MD   2,000 Units at 03/21/24 0847   dextrose  50 % solution 50 mL  1 ampule Intravenous PRN Alexander, Natalie, DO       dorzolamide -timolol  (COSOPT ) 2-0.5 % ophthalmic solution 1 drop  1 drop Both Eyes BID Fernand Prost, MD   1 drop at 03/21/24 0849   enoxaparin  (LOVENOX ) injection 30 mg  30 mg Subcutaneous Q24H Khan, Ghalib, MD   30 mg at 03/21/24 9362   feeding supplement (ENSURE PLUS HIGH PROTEIN) liquid 237 mL  237 mL Oral BID BM Alexander, Natalie, DO       hydrALAZINE  (APRESOLINE ) injection 10 mg  10 mg Intravenous Q4H PRN Alexander, Natalie, DO       hydrALAZINE  (APRESOLINE ) injection 10 mg  10 mg Intravenous Q4H PRN Alexander, Natalie, DO   10 mg at 03/20/24 [phone]  insulin  aspart (novoLOG ) injection 0-15 Units  0-15 Units Subcutaneous TID WC Alexander, Natalie, DO   8 Units at 03/21/24 1250   insulin  aspart (novoLOG ) injection 7 Units  7 Units Subcutaneous TID WC Alexander, Natalie, DO   7 Units at 03/21/24 1251   insulin  glargine-yfgn (SEMGLEE ) injection 10 Units  10 Units Subcutaneous Q2200 Alexander, Natalie, DO       irbesartan  (AVAPRO ) tablet 150 mg  150 mg Oral Daily Alexander, Natalie, DO   150 mg at 03/21/24 0847   lactose free nutrition (Boost) liquid 237 mL  237 mL Oral TID BM Marsa Edelman, DO   237 mL at 03/19/24 2030   levothyroxine  (SYNTHROID ) tablet 50 mcg  50 mcg Oral Q0600 Khan, Ghalib, MD   50 mcg at 03/21/24 9362   magnesium  chloride (SLOW-MAG) 64 MG SR tablet 64 mg  1 tablet Oral Daily Alexander, Natalie, DO   64 mg at 03/21/24 0847   multivitamin with minerals tablet 1  tablet  1 tablet Oral Daily Alexander, Natalie, DO   1 tablet at 03/21/24 0847   ondansetron  (ZOFRAN ) injection 4 mg  4 mg Intravenous Q4H PRN Mansy, Jan A, MD   4 mg at 03/20/24 1041   polyethylene glycol (MIRALAX  / GLYCOLAX ) packet 17 g  17 g Oral Daily Alexander, Natalie, DO   17 g at 03/21/24 9152   primidone  (MYSOLINE ) tablet 25-50 mg  25-50 mg Oral QHS Khan, Ghalib, MD   50 mg at 03/20/24 2041   rosuvastatin  (CRESTOR ) tablet 5 mg  5 mg Oral QHS Khan, Ghalib, MD   5 mg at 03/20/24 2041   sodium chloride  flush (NS) 0.9 % injection 3 mL  3 mL Intravenous Q12H Fernand Prost, MD   3 mL at 03/21/24 0849   sodium chloride  tablet 1 g  1 g Oral TID WC Alexander, Natalie, DO   1 g at 03/21/24 1250   thiamine  (VITAMIN B1) tablet 100 mg  100 mg Oral Daily Alexander, Natalie, DO   100 mg at 03/21/24 9152   traZODone  (DESYREL ) tablet 50-100 mg  50-100 mg Oral QHS PRN Alexander, Natalie, DO         Discharge Medications: Please see discharge summary for a list of discharge medications.  Relevant Imaging Results:  Relevant Lab Results:   Additional Information ss [id number]  Corean ONEIDA Haddock, RN

## 2024-03-21 NOTE — Progress Notes (Addendum)
 Occupational Therapy Treatment Patient Details Name: Barbara Santos MRN: [id number] DOB: 04-10-1936 Today's Date: 03/21/2024   History of present illness 88yoF [facility] after fall onto left elbow, legs gave out on her. PMH: HTN, HLD, DM2. UA without signs of infection.  CT head without any acute findings and CT cervical spine without any acute findings, plain radiography of the left knee without any acute findings.  Patient with laceration of the left elbow and imaging of the head is negative.  The laceration was repaired but given her recurrent fall and moderate hyponatremia   OT comments  Barbara Santos was seen for OT treatment on this date. Upon arrival to room pt in bed, agreeable to tx. Pt requires CGA + QC for ADL t/f ~150 ft. CGA + QC for 3 steps using rail. Son reports plan to stay with pt for ~1 week on discharge, lives ~5 min away and checks in daily at baseline. Educated pt and son on falls prevention, DME recs, d/c recs, and ECS. Pt making good progress toward goals, will continue to follow POC. Discharge recommendation remains appropriate.       If plan is discharge home, recommend the following:  A little help with walking and/or transfers;A little help with bathing/dressing/bathroom;Assistance with cooking/housework;Assist for transportation;Help with stairs or ramp for entrance   Equipment Recommendations  BSC/3in1    Recommendations for Other Services      Precautions / Restrictions Precautions Precautions: Fall Recall of Precautions/Restrictions: Intact Restrictions Weight Bearing Restrictions Per Provider Order: No       Mobility Bed Mobility Overal bed mobility: Modified Independent                  Transfers Overall transfer level: Needs assistance Equipment used: Quad cane Transfers: Sit to/from Stand Sit to Stand: Supervision                 Balance Overall balance assessment: Needs assistance Sitting-balance support: No  upper extremity supported, Feet supported Sitting balance-Leahy Scale: Good     Standing balance support: Single extremity supported, During functional activity Standing balance-Leahy Scale: Fair                             ADL either performed or assessed with clinical judgement   ADL Overall ADL's : Needs assistance/impaired                                       General ADL Comments: CGA + QC for ADL t/f ~150 ft. CGA + QC for 3 steps using rail     Communication Communication Communication: No apparent difficulties   Cognition Arousal: Alert Behavior During Therapy: WFL for tasks assessed/performed Cognition: No apparent impairments                               Following commands: Intact                      Pertinent Vitals/ Pain       Pain Assessment Pain Assessment: No/denies pain   Frequency  Min 2X/week        Progress Toward Goals  OT Goals(current goals can now be found in the care plan section)  Progress towards OT goals: Progressing toward goals  Acute Rehab OT Goals OT Goal Formulation: With patient Time For Goal Achievement: 04/02/24 Potential to Achieve Goals: Good ADL Goals Pt Will Perform Grooming: standing;with modified independence Pt Will Perform Lower Body Dressing: with modified independence;sitting/lateral leans Pt Will Transfer to Toilet: with modified independence;ambulating Pt Will Perform Toileting - Clothing Manipulation and hygiene: with modified independence;sit to/from stand  Plan      Co-evaluation                 AM-PAC OT 6 Clicks Daily Activity     Outcome Measure   Help from another person eating meals?: None Help from another person taking care of personal grooming?: A Little Help from another person toileting, which includes using toliet, bedpan, or urinal?: A Little Help from another person bathing (including washing, rinsing, drying)?: A Little Help from  another person to put on and taking off regular upper body clothing?: None Help from another person to put on and taking off regular lower body clothing?: None 6 Click Score: 21    End of Session    OT Visit Diagnosis: Unsteadiness on feet (R26.81);Other abnormalities of gait and mobility (R26.89);Repeated falls (R29.6);Muscle weakness (generalized) (M62.81)   Activity Tolerance Patient tolerated treatment well   Patient Left in chair;with call bell/phone within reach;with family/visitor present   Nurse Communication          Time: 8551-8490 OT Time Calculation (min): 21 min  Charges: OT General Charges $OT Visit: 1 Visit OT Treatments $Therapeutic Activity: 8-22 mins  Barbara Santos, M.S. OTR/L  03/21/24, 3:12 PM  ascom [phone]

## 2024-03-21 NOTE — TOC Initial Note (Signed)
 Transition of Care [facility]) - Initial/Assessment Note    Patient Details  Name: Barbara Santos MRN: [id number] Date of Birth: [birth year]  [facility]) CM/SW Contact:    Corean ONEIDA Haddock, RN Phone Number: 03/21/2024, 4:00 PM  Clinical Narrative:                  Admitted qnm:[id number]  Admitted from: home alone PCP: Karamalegos  Current home health/prior home health/DME: [facility], cane, shower seat  [facility] recommends SNF.  Patient and son Golie in agreement to bed search.  First preference is [facility], [facility]nd [facility] is [facility] PASRR obtained Fl2 sent for signature.  Bed search initiated         Patient Goals and CMS Choice            Expected Discharge Plan and Services                                              Prior Living Arrangements/Services                       Activities of Daily Living   ADL Screening (condition at time of admission) Independently performs ADLs?: Yes (appropriate for developmental age) Is the patient deaf or have difficulty hearing?: No Does the patient have difficulty seeing, even when wearing glasses/contacts?: No Does the patient have difficulty concentrating, remembering, or making decisions?: No  Permission Sought/Granted                  Emotional Assessment              Admission diagnosis:  Hyponatremia [E87.1] Fall, initial encounter [W19.XXXA] Elbow laceration, left, initial encounter [S51.012A] Other iron deficiency anemia [D50.8] Patient Active Problem List   Diagnosis Date Noted   Hyponatremia 03/18/2024   Constipation 09/19/2018   Essential hypertension 09/19/2018   Excessive gas 10/31/2016   Protein calorie malnutrition 10/31/2016   Hyperlipidemia 10/31/2016   Type II diabetes mellitus (HCC) 01/05/2015   Hypothyroidism 01/05/2015   Serum calcium  elevated 01/05/2015   PCP:  Edman Marsa PARAS, DO Pharmacy:   [phone] [place], [address] - [address] [place] [address] Phone: [phone] Fax: [phone]  [facility] [place], [place] - [address] [facility] AT Portal to Registered [facility] [address] [place] [address] Phone: [phone] Fax: [phone]  [facility] [phone] [place] [place] - [address] [place] [address] Phone: [phone] Fax: [phone]     Social Drivers of Health (SDOH) Social History: SDOH Screenings   Food Insecurity: No Food Insecurity (03/19/2024)  Housing: Low Risk  (03/19/2024)  Transportation Needs: No Transportation Needs (03/19/2024)  Utilities: Not At Risk (03/19/2024)  Alcohol Screen: Low Risk  (01/31/2024)  Depression (PHQ2-9): Low Risk  (01/31/2024)  Financial Resource Strain: Low Risk  (01/31/2024)  Physical Activity: Inactive (01/31/2024)  Social Connections: Moderately Integrated (03/19/2024)  Stress: No Stress Concern Present (01/31/2024)  Tobacco Use: Low Risk  (03/18/2024)  Health Literacy: Adequate Health Literacy (01/31/2024)   SDOH Interventions:     Readmission Risk Interventions     No data to display

## 2024-03-22 DIAGNOSIS — E871 Hypo-osmolality and hyponatremia: Secondary | ICD-10-CM | POA: Diagnosis not present

## 2024-03-22 LAB — GLUCOSE, CAPILLARY
Glucose-Capillary: 164 mg/dL — ABNORMAL HIGH (ref 70–99)
Glucose-Capillary: 95 mg/dL (ref 70–99)
Glucose-Capillary: 369 mg/dL — ABNORMAL HIGH (ref 70–99)
Glucose-Capillary: 115 mg/dL — ABNORMAL HIGH (ref 70–99)

## 2024-03-22 LAB — BASIC METABOLIC PANEL WITH GFR
Anion gap: 7 (ref 5–15)
Anion gap: 7 (ref 5–15)
Anion gap: 7 (ref 5–15)
BUN: 17 mg/dL (ref 8–23)
BUN: 20 mg/dL (ref 8–23)
BUN: 22 mg/dL (ref 8–23)
CO2: 25 mmol/L (ref 22–32)
CO2: 26 mmol/L (ref 22–32)
CO2: 29 mmol/L (ref 22–32)
Calcium: 9.1 mg/dL (ref 8.9–10.3)
Calcium: 9.5 mg/dL (ref 8.9–10.3)
Calcium: 9.5 mg/dL (ref 8.9–10.3)
Chloride: 96 mmol/L — ABNORMAL LOW (ref 98–111)
Chloride: 97 mmol/L — ABNORMAL LOW (ref 98–111)
Chloride: 97 mmol/L — ABNORMAL LOW (ref 98–111)
Creatinine, Ser: 0.43 mg/dL — ABNORMAL LOW (ref 0.44–1.00)
Creatinine, Ser: 0.46 mg/dL (ref 0.44–1.00)
Creatinine, Ser: 0.47 mg/dL (ref 0.44–1.00)
GFR, Estimated: >60 mL/min (ref >60)
GFR, Estimated: >60 mL/min (ref >60)
GFR, Estimated: >60 mL/min (ref >60)
Glucose, Bld: 173 mg/dL — ABNORMAL HIGH (ref 70–99)
Glucose, Bld: 185 mg/dL — ABNORMAL HIGH (ref 70–99)
Glucose, Bld: 195 mg/dL — ABNORMAL HIGH (ref 70–99)
Potassium: 4.2 mmol/L (ref 3.5–5.1)
Potassium: 4.9 mmol/L (ref 3.5–5.1)
Potassium: 5 mmol/L (ref 3.5–5.1)
Sodium: 129 mmol/L — ABNORMAL LOW (ref 135–145)
Sodium: 130 mmol/L — ABNORMAL LOW (ref 135–145)
Sodium: 132 mmol/L — ABNORMAL LOW (ref 135–145)

## 2024-03-22 LAB — PHOSPHORUS: Phosphorus: 2.8 mg/dL (ref 2.5–4.6)

## 2024-03-22 LAB — MAGNESIUM: Magnesium: 1.8 mg/dL (ref 1.7–2.4)

## 2024-03-22 MED ORDER — SODIUM CHLORIDE 1 G PO TABS
1.0000 g | ORAL_TABLET | Freq: Two times a day (BID) | ORAL | Status: DC
  Administered 2024-03-22 – 2024-03-24 (×4): 1 g via ORAL
  Filled 2024-03-22 (×5): qty 1

## 2024-03-22 MED ORDER — INSULIN ASPART 100 UNIT/ML IJ SOLN
5.0000 [IU] | Freq: Three times a day (TID) | INTRAMUSCULAR | Status: DC
  Administered 2024-03-22 – 2024-03-24 (×6): 5 [IU] via SUBCUTANEOUS
  Filled 2024-03-22 (×5): qty 5

## 2024-03-22 NOTE — Plan of Care (Signed)
  Problem: Education: Goal: Knowledge of General Education information will improve Description: Including pain rating scale, medication(s)/side effects and non-pharmacologic comfort measures Outcome: Progressing   Problem: Activity: Goal: Risk for activity intolerance will decrease Outcome: Progressing   Problem: Nutrition: Goal: Adequate nutrition will be maintained Outcome: Progressing   Problem: Elimination: Goal: Will not experience complications related to bowel motility Outcome: Progressing Goal: Will not experience complications related to urinary retention Outcome: Progressing   Problem: Skin Integrity: Goal: Risk for impaired skin integrity will decrease Outcome: Progressing   

## 2024-03-22 NOTE — Progress Notes (Signed)
 PROGRESS NOTE    Barbara Santos   FMW:[id number] DOB: [birth year]  DOA: 03/18/2024 Date of Service: 03/22/24 which is hospital day 3  PCP: Edman Marsa PARAS, [facility] course / significant events:   Barbara Santos is a 88 y.o. year old female with medical history of HTN, HLD, T2DM presenting to the ED after a fall.   HPI: Pt states she had a fall when her legs gave out.  She fell outside and tried to break her fall where she ended up hitting her knees and elbows which resulted in a laceration.  She says she did not hit her head or had any loss of consciousness.  She was able to get up but just felt weak.  She denies any palpitations, dizziness or lightheadedness around this time.  Patient reports a good appetite but discussing with son and daughter-in-law they report patient has decreased appetite and has lost weight recently.  It appears patient's metformin  was recently increased on 10/15.   11/25: admitted w/ recurrent fall / weakness and hyponatremia  11/26: no significant improvement in sodium, cortisol normal, suspect SIADH, resumed normal saline, following BMP  11/27: sodium corrected for hyperglycemia 124-126. Glc worsening. Added sliding scale and will get A1C, last A1c not indicative of poor control so will check now in case substantially worse. Glc pm also corrects to 124-126. Fluid restricting, adding salt tabs, eval for malignancy w/ CT C/A/P 11/28: remains hyperglycemic but some improved. Sodium continues to correct to about 125-126. CT thankfully no malignancy, she will need follow up lung nodules, question if this + emphysema explains SIADH. A1C still not resulted. Sodium this afternoon corrected 128-129 11/29: sodium corrects to 131 last night, to 134 today this morning. Increase intervals for BMP to q12h. Eased fluid restriction to 1800 mL/24h up from 1500. A1C 8.0 which doesn't seem to explain dramatic hyperglycemia here, keep plan for outpatient endocrine  follow up but Glc control is improved on current insulin  regimen      Consultants:  none  Procedures/Surgeries: none      ASSESSMENT & PLAN:   Acute on chronic hyponatremia Pseudohyponatremia in setting of hyperglycemia but still corrected sodium remains low  Hypotonic hyponatremia  Baseline Na about 132 No thiazide, normal TSH, no hypervolemia Cortisol level --> WNL Uosm and UNa up = in range for SIADH / reset osmostat  History = concern for tea/toast diet, low sodium intake, low nutrient intake  FE Uric Acid (>12% also points to SIADH) - 16.52% indicative of SIADH  question paraneoplastic? --> CT C/A/P no evidence for malignancy but did show lung nodules and emphysema see below Fluid restricting 1800 mL/d continue salt tabs reduce from tid to bid  May benefit from outpatient endocrinology  Significant imaging: CT C/A/P no evidence for malignancy but did show lung nodules and emphysema, question if this explains the SIADH Outpatient follow up Will need repeat CT chest to follow nodules  Type 2 diabetes mellitus:  Recently increase in metformin  which can have effect on appetite and may have led to decreased oral intake for this patient.  Given A1c recently was less than 8, felt comfortable holding home meds but Glc then significantly higher  Added sliding scale and will get A1C, last A1c not indicative of poor control so this level of hyperglycemia is concerns, will check now in case substantially worse, attn on CT to pancreas   Increasing insulin  mealtime / basal May benefit from outpatient endocrinology  Fall: Likely mechanical,  however unsteadiness may be d/t low sodium.  orthostatic vitals.   PT and OT consulted - recs for SNF rehab    Hypothyroidism:TSH WNL no concern for hypothryoid contributing to hyponatremia Continue home Synthroid    Hyperlipidemia:  Continue home statin  Hypomagnesemia replace   Hypertension:  BP high here Increased home ARB IV  hydralazine  prn    Intermittent esential tremors. Continue primidone    Cholelithiasis without acute cholecystitis, and Renal cyst on CT - not clinically significant at this time No follow up needed   underweight based on BMI: Body mass index is 16.25 kg/m.SABRA Significantly low or high BMI is associated with higher medical risk.  Suspect severe malnutrition Underweight - under 18  overweight - 25 to 29 obese - 30 or more Class 1 obesity: BMI of 30.0 to 34 Class 2 obesity: BMI of 35.0 to 39 Class 3 obesity: BMI of 40.0 to 49 Super Morbid Obesity: BMI 50-59 Super-super Morbid Obesity: BMI 60+ Healthy nutrition and physical activity advised as adjunct to other disease management and risk reduction treatments Dietician consult    DVT prophylaxis: lovenox  IV fluids: normal saline continuous IV fluids  Nutrition: regular (dietician consult given significant underweight) w/ fluid restrict  Central lines / other devices: none  Code Status: DNR ACP documentation reviewed:  none on file in [facility]  [facility] needs: SNF placment  Medical barriers to dispo: none.             Subjective / Brief ROS:  Patient reports feeling better today  Alert and conversational today and yeterday evening  answering questions.  Denies CP/SOB.  Pain controlled.    Family Communication: none at bedside today     Objective Findings:  Vitals:   03/22/24 0338 03/22/24 0500 03/22/24 0757 03/22/24 1502  BP: (!) 158/75  (!) 166/74 (!) 175/74  Pulse: 76  74 95  Resp: 20  18 18   Temp: 98 F (36.7 C)  (!) 97.3 F (36.3 C) 98.6 F (37 C)  TempSrc:   Oral Oral  SpO2: 99%  100% 99%  Weight:  39.6 kg    Height:        Intake/Output Summary (Last 24 hours) at 03/22/2024 1530 Last data filed at 03/22/2024 1300 Gross per 24 hour  Intake 480 ml  Output 350 ml  Net 130 ml   Filed Weights   03/20/24 0451 03/21/24 0537 03/22/24 0500  Weight: 39 kg 40.5 kg 39.6 kg    Examination:  Physical  Exam Constitutional:      General: She is not in acute distress.    Appearance: She is ill-appearing.  Cardiovascular:     Rate and Rhythm: Normal rate and regular rhythm.  Pulmonary:     Effort: Pulmonary effort is normal.     Breath sounds: Normal breath sounds.  Abdominal:     General: Bowel sounds are normal.     Palpations: Abdomen is soft.  Musculoskeletal:     Right lower leg: No edema.     Left lower leg: No edema.  Skin:    General: Skin is dry.     Coloration: Skin is pale.  Neurological:     General: No focal deficit present.     Mental Status: She is alert and oriented to person, place, and time. Mental status is at baseline.  Psychiatric:        Mood and Affect: Mood normal.        Behavior: Behavior normal.  Scheduled Medications:   cholecalciferol   2,000 Units Oral Daily   dorzolamide -timolol   1 drop Both Eyes BID   enoxaparin  (LOVENOX ) injection  30 mg Subcutaneous Q24H   feeding supplement  237 mL Oral BID BM   insulin  aspart  0-15 Units Subcutaneous TID WC   insulin  aspart  5 Units Subcutaneous TID WC   insulin  glargine-yfgn  10 Units Subcutaneous Q2200   irbesartan   150 mg Oral Daily   lactose free nutrition  237 mL Oral TID BM   levothyroxine   50 mcg Oral Q0600   magnesium  chloride  1 tablet Oral Daily   multivitamin with minerals  1 tablet Oral Daily   polyethylene glycol  17 g Oral Daily   primidone   25-50 mg Oral QHS   rosuvastatin   5 mg Oral QHS   sodium chloride  flush  3 mL Intravenous Q12H   sodium chloride   1 g Oral BID WC   thiamine   100 mg Oral Daily    Continuous Infusions:    PRN Medications:  acetaminophen  **OR** acetaminophen , bisacodyl , dextrose , hydrALAZINE , hydrALAZINE , ondansetron  (ZOFRAN ) IV, traZODone   Antimicrobials from admission:  Anti-infectives (From admission, onward)    None           Data Reviewed:  I have personally reviewed the following...  CBC: Recent Labs  Lab 03/18/24 1842  03/19/24 0604  WBC 8.3 7.8  NEUTROABS 5.4  --   HGB 11.5* 12.0  HCT 33.1* 34.4*  MCV 89.5 88.9  PLT 244 257   Basic Metabolic Panel: Recent Labs  Lab 03/18/24 2320 03/19/24 0604 03/20/24 0521 03/20/24 1127 03/21/24 0405 03/21/24 1029 03/21/24 1552 03/21/24 2352 03/22/24 0634 03/22/24 0826  NA 125*   < > 121*   < > 123* 125* 127* 129*  --  132*  K  --    < > 3.9   < > 4.5 3.8 4.1 4.2  --  5.0  CL  --    < > 90*   < > 92* 92* 93* 97*  --  96*  CO2  --    < > 20*   < > 24 24 24 25   --  29  GLUCOSE  --    < > 301*   < > 216* 274* 294* 195*  --  185*  BUN  --    < > 13   < > 22 18 16 22   --  17  CREATININE  --    < > 0.44   < > 0.56 0.55 0.45 0.46  --  0.47  CALCIUM   --    < > 9.0   < > 8.8* 8.9 9.0 9.1  --  9.5  MG 1.4*  --  1.9  --  1.8  --   --   --  1.8  --   PHOS  --   --  2.6  --  2.7  --   --   --  2.8  --    < > = values in this interval not displayed.   GFR: Estimated Creatinine Clearance: 31.6 mL/min (by C-G formula based on SCr of 0.47 mg/dL). Liver Function Tests: Recent Labs  Lab 03/18/24 1842  AST 26  ALT 20  ALKPHOS 73  BILITOT 0.4  PROT 6.6  ALBUMIN 4.4   No results for input(s): LIPASE, AMYLASE in the last 168 hours. No results for input(s): AMMONIA in the last 168 hours. Coagulation Profile: No results for input(s): INR, PROTIME in the last  168 hours. Cardiac Enzymes: No results for input(s): CKTOTAL, CKMB, CKMBINDEX, TROPONINI in the last 168 hours. BNP (last 3 results) No results for input(s): PROBNP in the last 8760 hours. HbA1C: Recent Labs    03/20/24 1127  HGBA1C 8.0*   CBG: Recent Labs  Lab 03/21/24 1202 03/21/24 1653 03/21/24 2205 03/22/24 0755 03/22/24 1119  GLUCAP 278* 208* 202* 164* 95   Lipid Profile: No results for input(s): CHOL, HDL, LDLCALC, TRIG, CHOLHDL, LDLDIRECT in the last 72 hours. Thyroid  Function Tests: No results for input(s): TSH, T4TOTAL, FREET4, T3FREE,  THYROIDAB in the last 72 hours.  Anemia Panel: No results for input(s): VITAMINB12, FOLATE, FERRITIN, TIBC, IRON, RETICCTPCT in the last 72 hours. Most Recent Urinalysis On File:     Component Value Date/Time   COLORURINE YELLOW (A) 03/18/2024 1922   APPEARANCEUR CLEAR (A) 03/18/2024 1922   LABSPEC 1.014 03/18/2024 1922   PHURINE 7.0 03/18/2024 1922   GLUCOSEU 150 (A) 03/18/2024 1922   HGBUR NEGATIVE 03/18/2024 1922   BILIRUBINUR NEGATIVE 03/18/2024 1922   BILIRUBINUR Negative 01/07/2020 0933   KETONESUR NEGATIVE 03/18/2024 1922   PROTEINUR NEGATIVE 03/18/2024 1922   UROBILINOGEN 0.2 01/07/2020 0933   NITRITE NEGATIVE 03/18/2024 1922   LEUKOCYTESUR TRACE (A) 03/18/2024 1922   Sepsis Labs: @LABRCNTIP (procalcitonin:4,lacticidven:4) Microbiology: No results found for this or any previous visit (from the past 240 hours).    Radiology Studies last 3 days: CT CHEST ABDOMEN PELVIS W CONTRAST Result Date: 03/20/2024 EXAM: CT CHEST, ABDOMEN AND PELVIS WITH CONTRAST 03/20/2024 06:07:06 PM TECHNIQUE: CT of the chest, abdomen and pelvis was performed with the administration of 80 mL of iohexol  (OMNIPAQUE ) 300 MG/ML solution. Multiplanar reformatted images are provided for review. Automated exposure control, iterative reconstruction, and/or weight based adjustment of the mA/kV was utilized to reduce the radiation dose to as low as reasonably achievable. COMPARISON: Ultrasound pelvis 05/12/2014. CLINICAL HISTORY: Metastatic disease evaluation. FINDINGS: CHEST: MEDIASTINUM AND LYMPH NODES: Heart and pericardium are unremarkable. The central airways are clear. No mediastinal, hilar or axillary lymphadenopathy. LUNGS AND PLEURA: Scarring in the lung apices. Mild emphysematous changes in the lungs with scattered interstitial fibrosis. Scattered pulmonary nodules are present, most prominent in the right apex, image 21, measuring 6 mm in diameter and in the right middle lung, image 70,  measuring 2.5 mm diameter. No pleural effusion or pneumothorax. ABDOMEN AND PELVIS: LIVER: Diffuse fatty infiltration of the liver. Subcentimeter low attenuation lesion in the lateral segment of the left lobe is too small to characterize but likely represents a small cyst or hemangioma. GALLBLADDER AND BILE DUCTS: Cholelithiasis with small stones in the gallbladder. No wall thickening or inflammatory stranding. No biliary ductal dilatation. SPLEEN: No acute abnormality. PANCREAS: No acute abnormality. ADRENAL GLANDS: No acute abnormality. KIDNEYS, URETERS AND BLADDER: 6 cm simple appearing cyst in the lower pole of the right kidney. No imaging follow-up is indicated. No stones in the kidneys or ureters. No hydronephrosis. No perinephric or periureteral stranding. Urinary bladder is unremarkable. GI AND BOWEL: Stool-filled colon. The colon is not abnormally distended. No wall thickening or inflammatory stranding is identified. Stomach and small bowel demonstrate no acute abnormality. There is no bowel obstruction. REPRODUCTIVE ORGANS: No acute abnormality. PERITONEUM AND RETROPERITONEUM: No ascites. No free air. No mesenteric mass or collection is seen. VASCULATURE: Calcification of the aorta. No aneurysm or dissection. ABDOMINAL AND PELVIS LYMPH NODES: No lymphadenopathy. BONES AND SOFT TISSUES: Degenerative changes in the spine and hips. Old left rib fractures. No focal bone lesions. No focal  soft tissue abnormality. IMPRESSION: 1. No evidence of metastatic disease. 2. Scattered pulmonary nodules measuring up to 6 mm in the right apex and 2.5 mm in the right middle lobe. Given oncologic indication and incomplete applicability of Fleischner guidelines, recommend non-contrast chest CT in 612 months to assess stability, with consideration of PET/CT or tissue sampling if interval growth or suspicious features develop. 3. Mild emphysema, which is an independent lung cancer risk factor. For patients aged 86, consider  evaluation for a low-dose CT lung cancer screening program. 4. Cholelithiasis without acute cholecystitis. Electronically signed by: Elsie Gravely MD 03/20/2024 06:25 PM EST RP Workstation: [id number]   CT Cervical Spine Wo Contrast Result Date: 03/18/2024 EXAM: CT CERVICAL SPINE WITHOUT CONTRAST 03/18/2024 07:00:54 PM TECHNIQUE: CT of the cervical spine was performed without the administration of intravenous contrast. Multiplanar reformatted images are provided for review. Automated exposure control, iterative reconstruction, and/or weight based adjustment of the mA/kV was utilized to reduce the radiation dose to as low as reasonably achievable. COMPARISON: None available. CLINICAL HISTORY: Neck trauma (Age >= 65y) FINDINGS: CERVICAL SPINE: BONES AND ALIGNMENT: Straightening of the normal cervical lordosis. No evidence of traumatic malalignment. No acute fracture. DEGENERATIVE CHANGES: Disc space narrowing most pronounced at C4-C5, C6-C7, and C7-T1. Degenerative endplate osteophytes at multiple levels. There is no high grade osseous spinal canal stenosis. Facet arthrosis and no facet hypertrophy at multiple levels. Foraminal stenosis most pronounced at C5-C6. SOFT TISSUES: No prevertebral soft tissue swelling. LUNGS: Biapical pleural parenchymal scarring. IMPRESSION: 1. No acute abnormality of the cervical spine related to the reported neck trauma. 2. Degenerative changes as above. Electronically signed by: Donnice Mania MD 03/18/2024 07:31 PM EST RP Workstation: [id number]   CT Head Wo Contrast Result Date: 03/18/2024 EXAM: CT HEAD WITHOUT 03/18/2024 07:00:54 PM TECHNIQUE: CT of the head was performed without the administration of intravenous contrast. Automated exposure control, iterative reconstruction, and/or weight based adjustment of the mA/kV was utilized to reduce the radiation dose to as low as reasonably achievable. COMPARISON: None available. CLINICAL HISTORY: Head trauma, minor (Age >= 65y)  FINDINGS: BRAIN AND VENTRICLES: Age related atrophy. No acute intracranial hemorrhage. No mass effect or midline shift. No extra-axial fluid collection. No evidence of acute infarct. No hydrocephalus. ORBITS: Bilateral cataract resection. No acute abnormality. SINUSES AND MASTOIDS: No acute abnormality. SOFT TISSUES AND SKULL: Atherosclerosis of skullbase vasculature without hyperdense vessel or abnormal calcification. No acute skull fracture. No acute soft tissue abnormality. IMPRESSION: 1. No acute intracranial abnormality related to head trauma. Electronically signed by: Donnice Mania MD 03/18/2024 07:26 PM EST RP Workstation: [id number]   DG Knee Complete 4 Views Left Result Date: 03/18/2024 EXAM: 4 OR MORE VIEW(S) Xray of the Left Knee 03/18/2024 06:05:29 PM COMPARISON: None available. CLINICAL HISTORY: fall fall FINDINGS: BONES AND JOINTS: No acute fracture. No focal osseous lesion. No joint dislocation. No significant joint effusion. No significant degenerative changes. SOFT TISSUES: The soft tissues are unremarkable. IMPRESSION: 1. No significant abnormality. Electronically signed by: Elsie Gravely MD 03/18/2024 06:26 PM EST RP Workstation: [id number]   DG Elbow Complete Left Result Date: 03/18/2024 EXAM: 3 VIEW(S) XRAY OF THE LEFT ELBOW COMPARISON: None available. CLINICAL HISTORY: Fall Fall FINDINGS: BONES AND JOINTS: No acute fracture. No focal osseous lesion. No joint dislocation. No joint effusion. SOFT TISSUES: The soft tissues are unremarkable. IMPRESSION: 1. No acute abnormality. Electronically signed by: Elsie Gravely MD 03/18/2024 06:25 PM EST RP Workstation: [id number]       Time spent: 50 min  Matias Thurman, DO [facility] 03/22/2024, 3:30 PM    Dictation software may have been used to generate the above note. Typos may occur and escape review in typed/dictated notes. Please contact Dr Marsa directly for clarity if needed.  Staff may message me via  secure chat in Epic  but this may not receive an immediate response,  please page me for urgent matters!  If 7PM-7AM, please contact night coverage [email or web address]

## 2024-03-22 NOTE — Plan of Care (Signed)

## 2024-03-23 DIAGNOSIS — E871 Hypo-osmolality and hyponatremia: Secondary | ICD-10-CM | POA: Diagnosis not present

## 2024-03-23 LAB — GLUCOSE, CAPILLARY
Glucose-Capillary: 112 mg/dL — ABNORMAL HIGH (ref 70–99)
Glucose-Capillary: 182 mg/dL — ABNORMAL HIGH (ref 70–99)
Glucose-Capillary: 186 mg/dL — ABNORMAL HIGH (ref 70–99)
Glucose-Capillary: 167 mg/dL — ABNORMAL HIGH (ref 70–99)
Glucose-Capillary: 176 mg/dL — ABNORMAL HIGH (ref 70–99)

## 2024-03-23 MED ORDER — MAGNESIUM HYDROXIDE 400 MG/5ML PO SUSP
30.0000 mL | Freq: Once | ORAL | Status: AC
  Administered 2024-03-23: 30 mL via ORAL
  Filled 2024-03-23: qty 30

## 2024-03-23 NOTE — Progress Notes (Signed)
 Physical Therapy Treatment Patient Details Name: Barbara Santos MRN: [id number] DOB: [birth year] Today's Date: 03/23/2024   History of Present Illness 88yoF [facility] after fall onto left elbow, legs gave out on her. PMH: HTN, HLD, DM2. UA without signs of infection.  CT head without any acute findings and CT cervical spine without any acute findings, plain radiography of the left knee without any acute findings.  Patient with laceration of the left elbow and imaging of the head is negative.  The laceration was repaired but given her recurrent fall and moderate hyponatremia    PT Comments  Patient seen for PT session focused on functional mobility. Patient required min/CGA for 100' feet with Rolator. Tolerated session well with no signs of exertion or distress. Vitals remained stable during activity. Interventions aimed at improving activity tolerance. Patient shows great potential to make progress with continued acute level rehab. Patient continues to demonstrate mild activity restrictions. Continued skilled PT recommended to progress toward functional goals and support discharge readiness. Pt making good progress toward goals, will continue to follow POC. Discharge recommendation remains appropriate     If plan is discharge home, recommend the following: A lot of help with walking and/or transfers;A lot of help with bathing/dressing/bathroom;Direct supervision/assist for medications management;Assistance with cooking/housework   Can travel by private vehicle     Yes  Equipment Recommendations  None recommended by PT    Recommendations for Other Services       Precautions / Restrictions Precautions Precautions: Fall Recall of Precautions/Restrictions: Intact Restrictions Weight Bearing Restrictions Per Provider Order: No     Mobility  Bed Mobility Overal bed mobility: Modified Independent Bed Mobility: Supine to Sit, Sit to Supine     Supine to sit: HOB  elevated, Contact guard Sit to supine: HOB elevated, Contact guard assist, Supervision        Transfers Overall transfer level: Needs assistance Equipment used: None Transfers: Sit to/from Stand Sit to Stand: Supervision                Ambulation/Gait Ambulation/Gait assistance: Contact guard assist, Min assist Gait Distance (Feet): 100 Feet Assistive device: Rollator (4 wheels) Gait Pattern/deviations: Step-to pattern, Drifts right/left, Narrow base of support           Stairs             Wheelchair Mobility     Tilt Bed    Modified Rankin (Stroke Patients Only)       Balance Overall balance assessment: Needs assistance Sitting-balance support: No upper extremity supported, Feet supported Sitting balance-Leahy Scale: Good     Standing balance support: Single extremity supported, During functional activity Standing balance-Leahy Scale: Fair                              Hotel Manager: No apparent difficulties  Cognition Arousal: Alert Behavior During Therapy: WFL for tasks assessed/performed   PT - Cognitive impairments: No apparent impairments                         Following commands: Intact      Cueing Cueing Techniques: Verbal cues  Exercises      General Comments        Pertinent Vitals/Pain Pain Assessment Pain Assessment: No/denies pain    Home Living  Prior Function            PT Goals (current goals can now be found in the care plan section) Acute Rehab PT Goals Patient Stated Goal: regain leg strength, avoid additional falls. Time For Goal Achievement: 04/02/24 Potential to Achieve Goals: Good Progress towards PT goals: Progressing toward goals    Frequency    Min 2X/week      PT Plan      Co-evaluation              AM-PAC PT 6 Clicks Mobility   Outcome Measure  Help needed turning from your back to your  side while in a flat bed without using bedrails?: None Help needed moving from lying on your back to sitting on the side of a flat bed without using bedrails?: None Help needed moving to and from a bed to a chair (including a wheelchair)?: None Help needed standing up from a chair using your arms (e.g., wheelchair or bedside chair)?: None Help needed to walk in hospital room?: A Little Help needed climbing 3-5 steps with a railing? : A Little 6 Click Score: 22    End of Session Equipment Utilized During Treatment: Gait belt Activity Tolerance: Patient tolerated treatment well Patient left: in bed;with family/visitor present Nurse Communication: Mobility status PT Visit Diagnosis: Unsteadiness on feet (R26.81);Other abnormalities of gait and mobility (R26.89);Difficulty in walking, not elsewhere classified (R26.2)     Time: 8654-8596 PT Time Calculation (min) (ACUTE ONLY): 18 min  Charges:    $Therapeutic Activity: 8-22 mins PT General Charges $$ ACUTE PT VISIT: 1 Visit                     Barbara Lesches DPT, PT     Barbara Santos 03/23/2024, 2:44 PM

## 2024-03-23 NOTE — Progress Notes (Signed)
 PROGRESS NOTE    Barbara Santos   FMW:[id number] DOB: 04/12/1936  DOA: 03/18/2024 Date of Service: 03/23/24 which is hospital day 4  PCP: Barbara Santos, [facility] course / significant events:   Barbara Santos is a 88 y.o. year old female with medical history of HTN, HLD, T2DM presenting to the ED after a fall.   HPI: Pt states she had a fall when her legs gave out.  She fell outside and tried to break her fall where she ended up hitting her knees and elbows which resulted in a laceration.  She says she did not hit her head or had any loss of consciousness.  She was able to get up but just felt weak.  She denies any palpitations, dizziness or lightheadedness around this time.  Patient reports a good appetite but discussing with son and daughter-in-law they report patient has decreased appetite and has lost weight recently.  It appears patient's metformin  was recently increased on 10/15.   11/25: admitted w/ recurrent fall / weakness and hyponatremia  11/26: no significant improvement in sodium, cortisol normal, suspect SIADH, resumed normal saline, following BMP  11/27: sodium corrected for hyperglycemia 124-126. Glc worsening. Added sliding scale and will get A1C, last A1c not indicative of poor control so will check now in case substantially worse. Glc pm also corrects to 124-126. Fluid restricting, adding salt tabs, eval for malignancy w/ CT C/A/P 11/28: remains hyperglycemic but some improved. Sodium continues to correct to about 125-126. CT thankfully no malignancy, she will need follow up lung nodules, question if this + emphysema explains SIADH. A1C still not resulted. Sodium this afternoon corrected 128-129 11/29: sodium corrects to 131 last night, to 134 today this morning. Increase intervals for BMP to q12h. Eased fluid restriction to 1800 mL/24h up from 1500. A1C 8.0 which doesn't seem to explain dramatic hyperglycemia here, keep plan for outpatient endocrine  follow up but Glc control is improved on current insulin  regimen 11/30: doing well, sodium and Glc trending appropriately, continue current orders and await SNF placement       Consultants:  none  Procedures/Surgeries: none      ASSESSMENT & PLAN:   Acute on chronic hyponatremia Pseudohyponatremia in setting of hyperglycemia but still corrected sodium remains low  Hypotonic hyponatremia  Baseline Na about 132 No thiazide, normal TSH, no hypervolemia Cortisol level --> WNL Uosm and UNa up = in range for SIADH / reset osmostat  History = concern for tea/toast diet, low sodium intake, low nutrient intake  FE Uric Acid (>12% also points to SIADH) - 16.52% indicative of SIADH  question paraneoplastic? --> CT C/A/P no evidence for malignancy but did show lung nodules and emphysema see below Fluid restricting 1800 mL/d continue salt tabs reduce bid  May benefit from outpatient endocrinology  Significant imaging: CT C/A/P no evidence for malignancy but did show lung nodules and emphysema, question if this explains the SIADH Outpatient follow up Will need repeat CT chest to follow nodules  Type 2 diabetes mellitus:  Recently increase in metformin  which can have effect on appetite and may have led to decreased oral intake for this patient.  Given A1c recently was less than 8, felt comfortable holding home meds but Glc then significantly higher  Continue insulin  mealtime SSI + basal May benefit from outpatient endocrinology  Fall: Likely mechanical, however unsteadiness may be d/t low sodium.  PT and OT consulted - recs for SNF rehab    Hypothyroidism:TSH  WNL no concern for hypothryoid contributing to hyponatremia Continue home Synthroid    Hyperlipidemia:  Continue home statin  Hypomagnesemia replace   Hypertension:  BP high here Increased home ARB IV hydralazine  prn    Intermittent esential tremors. Continue primidone    Cholelithiasis without acute cholecystitis,  and Renal cyst on CT - not clinically significant at this time No follow up needed   underweight based on BMI: Body mass index is 16.25 kg/m.SABRA Significantly low or high BMI is associated with higher medical risk.  Suspect severe malnutrition Underweight - under 18  overweight - 25 to 29 obese - 30 or more Class 1 obesity: BMI of 30.0 to 34 Class 2 obesity: BMI of 35.0 to 39 Class 3 obesity: BMI of 40.0 to 49 Super Morbid Obesity: BMI 50-59 Super-super Morbid Obesity: BMI 60+ Healthy nutrition and physical activity advised as adjunct to other disease management and risk reduction treatments Dietician consult    DVT prophylaxis: lovenox  IV fluids: normal saline continuous IV fluids  Nutrition: regular (dietician consult given significant underweight) w/ fluid restrict  Central lines / other devices: none  Code Status: DNR ACP documentation reviewed:  none on file in [facility]  [facility] needs: SNF placment  Medical barriers to dispo: none.             Subjective / Brief ROS:  Patient reports feeling okay today other than constipation  Denies CP/SOB.  Pain controlled.    Family Communication: updated son by phone 03/23/24     Objective Findings:  Vitals:   03/22/24 2019 03/23/24 0451 03/23/24 0500 03/23/24 0821  BP: (!) 150/69 138/79  (!) 148/70  Pulse: 79 73  68  Resp: 17 14  16   Temp: 97.7 F (36.5 C) 97.8 F (36.6 C)  97.9 F (36.6 C)  TempSrc:      SpO2: 100% 100%  100%  Weight:   40.7 kg   Height:        Intake/Output Summary (Last 24 hours) at 03/23/2024 1316 Last data filed at 03/23/2024 0900 Gross per 24 hour  Intake 350 ml  Output --  Net 350 ml   Filed Weights   03/21/24 0537 03/22/24 0500 03/23/24 0500  Weight: 40.5 kg 39.6 kg 40.7 kg    Examination:  Physical Exam Constitutional:      General: She is not in acute distress.    Appearance: She is ill-appearing.  Cardiovascular:     Rate and Rhythm: Normal rate and regular rhythm.   Pulmonary:     Effort: Pulmonary effort is normal.     Breath sounds: Normal breath sounds.  Abdominal:     General: Bowel sounds are normal.     Palpations: Abdomen is soft.  Musculoskeletal:     Right lower leg: No edema.     Left lower leg: No edema.  Skin:    General: Skin is dry.     Coloration: Skin is pale.  Neurological:     General: No focal deficit present.     Mental Status: She is alert and oriented to person, place, and time. Mental status is at baseline.  Psychiatric:        Mood and Affect: Mood normal.        Behavior: Behavior normal.          Scheduled Medications:   cholecalciferol   2,000 Units Oral Daily   dorzolamide -timolol   1 drop Both Eyes BID   enoxaparin  (LOVENOX ) injection  30 mg Subcutaneous Q24H   feeding  supplement  237 mL Oral BID BM   insulin  aspart  0-15 Units Subcutaneous TID WC   insulin  aspart  5 Units Subcutaneous TID WC   insulin  glargine-yfgn  10 Units Subcutaneous Q2200   irbesartan   150 mg Oral Daily   lactose free nutrition  237 mL Oral TID BM   levothyroxine   50 mcg Oral Q0600   magnesium  chloride  1 tablet Oral Daily   magnesium  hydroxide  30 mL Oral Once   multivitamin with minerals  1 tablet Oral Daily   polyethylene glycol  17 g Oral Daily   primidone   25-50 mg Oral QHS   rosuvastatin   5 mg Oral QHS   sodium chloride  flush  3 mL Intravenous Q12H   sodium chloride   1 g Oral BID WC   thiamine   100 mg Oral Daily    Continuous Infusions:    PRN Medications:  acetaminophen  **OR** acetaminophen , bisacodyl , dextrose , hydrALAZINE , hydrALAZINE , ondansetron  (ZOFRAN ) IV, traZODone   Antimicrobials from admission:  Anti-infectives (From admission, onward)    None           Data Reviewed:  I have personally reviewed the following...  CBC: Recent Labs  Lab 03/18/24 1842 03/19/24 0604  WBC 8.3 7.8  NEUTROABS 5.4  --   HGB 11.5* 12.0  HCT 33.1* 34.4*  MCV 89.5 88.9  PLT 244 257   Basic Metabolic  Panel: Recent Labs  Lab 03/18/24 2320 03/19/24 0604 03/20/24 0521 03/20/24 1127 03/21/24 0405 03/21/24 1029 03/21/24 1552 03/21/24 2352 03/22/24 0634 03/22/24 0826 03/22/24 2150  NA 125*   < > 121*   < > 123* 125* 127* 129*  --  132* 130*  K  --    < > 3.9   < > 4.5 3.8 4.1 4.2  --  5.0 4.9  CL  --    < > 90*   < > 92* 92* 93* 97*  --  96* 97*  CO2  --    < > 20*   < > 24 24 24 25   --  29 26  GLUCOSE  --    < > 301*   < > 216* 274* 294* 195*  --  185* 173*  BUN  --    < > 13   < > 22 18 16 22   --  17 20  CREATININE  --    < > 0.44   < > 0.56 0.55 0.45 0.46  --  0.47 0.43*  CALCIUM   --    < > 9.0   < > 8.8* 8.9 9.0 9.1  --  9.5 9.5  MG 1.4*  --  1.9  --  1.8  --   --   --  1.8  --   --   PHOS  --   --  2.6  --  2.7  --   --   --  2.8  --   --    < > = values in this interval not displayed.   GFR: Estimated Creatinine Clearance: 32.4 mL/min (A) (by C-G formula based on SCr of 0.43 mg/dL (L)). Liver Function Tests: Recent Labs  Lab 03/18/24 1842  AST 26  ALT 20  ALKPHOS 73  BILITOT 0.4  PROT 6.6  ALBUMIN 4.4   No results for input(s): LIPASE, AMYLASE in the last 168 hours. No results for input(s): AMMONIA in the last 168 hours. Coagulation Profile: No results for input(s): INR, PROTIME in the last 168 hours. Cardiac Enzymes: No  results for input(s): CKTOTAL, CKMB, CKMBINDEX, TROPONINI in the last 168 hours. BNP (last 3 results) No results for input(s): PROBNP in the last 8760 hours. HbA1C: No results for input(s): HGBA1C in the last 72 hours.  CBG: Recent Labs  Lab 03/22/24 1705 03/22/24 2108 03/23/24 0856 03/23/24 1139 03/23/24 1200  GLUCAP 369* 115* 112* 186* 182*   Lipid Profile: No results for input(s): CHOL, HDL, LDLCALC, TRIG, CHOLHDL, LDLDIRECT in the last 72 hours. Thyroid  Function Tests: No results for input(s): TSH, T4TOTAL, FREET4, T3FREE, THYROIDAB in the last 72 hours.  Anemia Panel: No results for  input(s): VITAMINB12, FOLATE, FERRITIN, TIBC, IRON, RETICCTPCT in the last 72 hours. Most Recent Urinalysis On File:     Component Value Date/Time   COLORURINE YELLOW (A) 03/18/2024 1922   APPEARANCEUR CLEAR (A) 03/18/2024 1922   LABSPEC 1.014 03/18/2024 1922   PHURINE 7.0 03/18/2024 1922   GLUCOSEU 150 (A) 03/18/2024 1922   HGBUR NEGATIVE 03/18/2024 1922   BILIRUBINUR NEGATIVE 03/18/2024 1922   BILIRUBINUR Negative 01/07/2020 0933   KETONESUR NEGATIVE 03/18/2024 1922   PROTEINUR NEGATIVE 03/18/2024 1922   UROBILINOGEN 0.2 01/07/2020 0933   NITRITE NEGATIVE 03/18/2024 1922   LEUKOCYTESUR TRACE (A) 03/18/2024 1922   Sepsis Labs: @LABRCNTIP (procalcitonin:4,lacticidven:4) Microbiology: No results found for this or any previous visit (from the past 240 hours).    Radiology Studies last 3 days: CT CHEST ABDOMEN PELVIS W CONTRAST Result Date: 03/20/2024 EXAM: CT CHEST, ABDOMEN AND PELVIS WITH CONTRAST 03/20/2024 06:07:06 PM TECHNIQUE: CT of the chest, abdomen and pelvis was performed with the administration of 80 mL of iohexol  (OMNIPAQUE ) 300 MG/ML solution. Multiplanar reformatted images are provided for review. Automated exposure control, iterative reconstruction, and/or weight based adjustment of the mA/kV was utilized to reduce the radiation dose to as low as reasonably achievable. COMPARISON: Ultrasound pelvis 05/12/2014. CLINICAL HISTORY: Metastatic disease evaluation. FINDINGS: CHEST: MEDIASTINUM AND LYMPH NODES: Heart and pericardium are unremarkable. The central airways are clear. No mediastinal, hilar or axillary lymphadenopathy. LUNGS AND PLEURA: Scarring in the lung apices. Mild emphysematous changes in the lungs with scattered interstitial fibrosis. Scattered pulmonary nodules are present, most prominent in the right apex, image 21, measuring 6 mm in diameter and in the right middle lung, image 70, measuring 2.5 mm diameter. No pleural effusion or pneumothorax. ABDOMEN  AND PELVIS: LIVER: Diffuse fatty infiltration of the liver. Subcentimeter low attenuation lesion in the lateral segment of the left lobe is too small to characterize but likely represents a small cyst or hemangioma. GALLBLADDER AND BILE DUCTS: Cholelithiasis with small stones in the gallbladder. No wall thickening or inflammatory stranding. No biliary ductal dilatation. SPLEEN: No acute abnormality. PANCREAS: No acute abnormality. ADRENAL GLANDS: No acute abnormality. KIDNEYS, URETERS AND BLADDER: 6 cm simple appearing cyst in the lower pole of the right kidney. No imaging follow-up is indicated. No stones in the kidneys or ureters. No hydronephrosis. No perinephric or periureteral stranding. Urinary bladder is unremarkable. GI AND BOWEL: Stool-filled colon. The colon is not abnormally distended. No wall thickening or inflammatory stranding is identified. Stomach and small bowel demonstrate no acute abnormality. There is no bowel obstruction. REPRODUCTIVE ORGANS: No acute abnormality. PERITONEUM AND RETROPERITONEUM: No ascites. No free air. No mesenteric mass or collection is seen. VASCULATURE: Calcification of the aorta. No aneurysm or dissection. ABDOMINAL AND PELVIS LYMPH NODES: No lymphadenopathy. BONES AND SOFT TISSUES: Degenerative changes in the spine and hips. Old left rib fractures. No focal bone lesions. No focal soft tissue abnormality. IMPRESSION: 1. No  evidence of metastatic disease. 2. Scattered pulmonary nodules measuring up to 6 mm in the right apex and 2.5 mm in the right middle lobe. Given oncologic indication and incomplete applicability of Fleischner guidelines, recommend non-contrast chest CT in 612 months to assess stability, with consideration of PET/CT or tissue sampling if interval growth or suspicious features develop. 3. Mild emphysema, which is an independent lung cancer risk factor. For patients aged 86, consider evaluation for a low-dose CT lung cancer screening program. 4.  Cholelithiasis without acute cholecystitis. Electronically signed by: Elsie Gravely MD 03/20/2024 06:25 PM EST RP Workstation: [id number]       Time spent: 50 min     Laneta Blunt, DO [facility] 03/23/2024, 1:16 PM    Dictation software may have been used to generate the above note. Typos may occur and escape review in typed/dictated notes. Please contact Dr Blunt directly for clarity if needed.  Staff may message me via secure chat in Epic  but this may not receive an immediate response,  please page me for urgent matters!  If 7PM-7AM, please contact night coverage [email or web address]

## 2024-03-24 DIAGNOSIS — D649 Anemia, unspecified: Secondary | ICD-10-CM | POA: Diagnosis not present

## 2024-03-24 DIAGNOSIS — R41841 Cognitive communication deficit: Secondary | ICD-10-CM | POA: Diagnosis not present

## 2024-03-24 DIAGNOSIS — I10 Essential (primary) hypertension: Secondary | ICD-10-CM | POA: Diagnosis not present

## 2024-03-24 DIAGNOSIS — E119 Type 2 diabetes mellitus without complications: Secondary | ICD-10-CM | POA: Diagnosis not present

## 2024-03-24 DIAGNOSIS — E871 Hypo-osmolality and hyponatremia: Secondary | ICD-10-CM | POA: Diagnosis not present

## 2024-03-24 DIAGNOSIS — H4010X0 Unspecified open-angle glaucoma, stage unspecified: Secondary | ICD-10-CM | POA: Diagnosis not present

## 2024-03-24 DIAGNOSIS — R251 Tremor, unspecified: Secondary | ICD-10-CM | POA: Diagnosis not present

## 2024-03-24 DIAGNOSIS — E785 Hyperlipidemia, unspecified: Secondary | ICD-10-CM | POA: Diagnosis not present

## 2024-03-24 DIAGNOSIS — E46 Unspecified protein-calorie malnutrition: Secondary | ICD-10-CM | POA: Diagnosis not present

## 2024-03-24 DIAGNOSIS — E559 Vitamin D deficiency, unspecified: Secondary | ICD-10-CM | POA: Diagnosis not present

## 2024-03-24 DIAGNOSIS — G4700 Insomnia, unspecified: Secondary | ICD-10-CM | POA: Diagnosis not present

## 2024-03-24 DIAGNOSIS — E1100 Type 2 diabetes mellitus with hyperosmolarity without nonketotic hyperglycemic-hyperosmolar coma (NKHHC): Secondary | ICD-10-CM | POA: Diagnosis not present

## 2024-03-24 DIAGNOSIS — E569 Vitamin deficiency, unspecified: Secondary | ICD-10-CM | POA: Diagnosis not present

## 2024-03-24 DIAGNOSIS — R945 Abnormal results of liver function studies: Secondary | ICD-10-CM | POA: Diagnosis not present

## 2024-03-24 DIAGNOSIS — E039 Hypothyroidism, unspecified: Secondary | ICD-10-CM | POA: Diagnosis not present

## 2024-03-24 DIAGNOSIS — E875 Hyperkalemia: Secondary | ICD-10-CM | POA: Diagnosis not present

## 2024-03-24 DIAGNOSIS — M6259 Muscle wasting and atrophy, not elsewhere classified, multiple sites: Secondary | ICD-10-CM | POA: Diagnosis not present

## 2024-03-24 LAB — BASIC METABOLIC PANEL WITH GFR
Anion gap: 8 (ref 5–15)
BUN: 21 mg/dL (ref 8–23)
CO2: 27 mmol/L (ref 22–32)
Calcium: 9.4 mg/dL (ref 8.9–10.3)
Chloride: 97 mmol/L — ABNORMAL LOW (ref 98–111)
Creatinine, Ser: 0.52 mg/dL (ref 0.44–1.00)
GFR, Estimated: >60 mL/min (ref >60)
Glucose, Bld: 123 mg/dL — ABNORMAL HIGH (ref 70–99)
Potassium: 4.6 mmol/L (ref 3.5–5.1)
Sodium: 132 mmol/L — ABNORMAL LOW (ref 135–145)

## 2024-03-24 LAB — GLUCOSE, CAPILLARY
Glucose-Capillary: 229 mg/dL — ABNORMAL HIGH (ref 70–99)
Glucose-Capillary: 144 mg/dL — ABNORMAL HIGH (ref 70–99)

## 2024-03-24 MED ORDER — SODIUM CHLORIDE 1 G PO TABS: 1.0000 g | ORAL_TABLET | Freq: Two times a day (BID) | ORAL | Status: DC

## 2024-03-24 MED ORDER — INSULIN ASPART 100 UNIT/ML IJ SOLN: 5.0000 [IU] | Freq: Three times a day (TID) | INTRAMUSCULAR | Status: DC

## 2024-03-24 MED ORDER — INSULIN ASPART 100 UNIT/ML IJ SOLN: INTRAMUSCULAR | Status: DC

## 2024-03-24 MED ORDER — BISACODYL 10 MG RE SUPP: 10.0000 mg | Freq: Every day | RECTAL | Status: AC | PRN

## 2024-03-24 MED ORDER — THIAMINE HCL 100 MG PO TABS: 100.0000 mg | ORAL_TABLET | Freq: Every day | ORAL | Status: AC

## 2024-03-24 MED ORDER — MAGNESIUM CHLORIDE 64 MG PO TBEC: 1.0000 | DELAYED_RELEASE_TABLET | Freq: Every day | ORAL | Status: AC

## 2024-03-24 MED ORDER — TRAZODONE HCL 50 MG PO TABS: 50.0000 mg | ORAL_TABLET | Freq: Every evening | ORAL | Status: DC | PRN

## 2024-03-24 MED ORDER — INSULIN GLARGINE-YFGN 100 UNIT/ML ~~LOC~~ SOLN: 10.0000 [IU] | Freq: Every day | SUBCUTANEOUS | Status: DC

## 2024-03-24 NOTE — Progress Notes (Signed)
 Mobility Specialist - Progress Note   03/24/24 1509  Mobility  Activity Ambulated with assistance  Level of Assistance Standby assist, set-up cues, supervision of patient - no hands on  Assistive Device Front wheel walker  Distance Ambulated (ft) 24 ft  Activity Response Tolerated well  Mobility visit 1 Mobility  Mobility Specialist Start Time (ACUTE ONLY) 1451  Mobility Specialist Stop Time (ACUTE ONLY) 1459  Mobility Specialist Time Calculation (min) (ACUTE ONLY) 8 min   Pt amb to/from the bathroom MinG-Sup, tolerated well. Pt returned to bed, left supine with alarm set and needs within reach.  America Silvan Mobility Specialist 03/24/24 3:11 PM

## 2024-03-24 NOTE — TOC Transition Note (Signed)
 Transition of Care [facility]) - Discharge Note   Patient Details  Name: Barbara Santos MRN: [id number] Date of Birth: 11-Dec-1935  [facility]) CM/SW Contact:  Alfonso Rummer, LCSW Phone Number: 03/24/2024, 1:07 PM   Clinical Narrative:    Pt will transition to [facility]. Pt son    Final next level of care: Skilled Nursing Facility Barriers to Discharge: Barriers Resolved   Patient Goals and CMS Choice     Choice offered to / list presented to : Patient      Discharge Placement PASRR number recieved: 03/24/24            Patient chooses bed at: [facility] [place] Patient to be transferred to facility by: pt son golie Pemble Name of family member notified: pt son golie Wolfman Patient and family notified of of transfer: 03/24/24  Discharge Plan and Services Additional resources added to the After Visit Summary for                                       Social Drivers of Health (SDOH) Interventions SDOH Screenings   Food Insecurity: No Food Insecurity (03/19/2024)  Housing: Low Risk  (03/19/2024)  Transportation Needs: No Transportation Needs (03/19/2024)  Utilities: Not At Risk (03/19/2024)  Alcohol Screen: Low Risk  (01/31/2024)  Depression (PHQ2-9): Low Risk  (01/31/2024)  Financial Resource Strain: Low Risk  (01/31/2024)  Physical Activity: Inactive (01/31/2024)  Social Connections: Moderately Integrated (03/19/2024)  Stress: No Stress Concern Present (01/31/2024)  Tobacco Use: Low Risk  (03/18/2024)  Health Literacy: Adequate Health Literacy (01/31/2024)     Readmission Risk Interventions     No data to display

## 2024-03-24 NOTE — Progress Notes (Addendum)
 Physical Therapy Treatment Patient Details Name: Barbara Santos MRN: [id number] DOB: 10-30-1935 Today's Date: 03/24/2024   History of Present Illness 88yoF [facility] after fall onto left elbow, legs gave out on her. PMH: HTN, HLD, DM2. UA without signs of infection.  CT head without any acute findings and CT cervical spine without any acute findings, plain radiography of the left knee without any acute findings.  Patient with laceration of the left elbow and imaging of the head is negative.  The laceration was repaired but given her recurrent fall and moderate hyponatremia    PT Comments  Pt received in supine and was pleasant and agreeable to PT. She was able to complete bed mobility with mod I, no vc needed. Pt completed STS to no AD with supervision. Per pt she feels more confident with RW to ambulate--she was able to ambulate 315ft total with RW and utilized the QC only to get from her bed to her door. On assessment, pt does not make appropriate contact to the ground with the QC and would benefit from the RW for safety. She will continue to benefit from skilled therapy services to address her functional mobility, strength, and activity tolerance.   If plan is discharge home, recommend the following: Direct supervision/assist for medications management;Assistance with cooking/housework;A little help with walking and/or transfers;A little help with bathing/dressing/bathroom   Can travel by private vehicle     Yes  Equipment Recommendations  None recommended by PT    Recommendations for Other Services       Precautions / Restrictions Precautions Precautions: Fall Recall of Precautions/Restrictions: Intact Restrictions Weight Bearing Restrictions Per Provider Order: No     Mobility  Bed Mobility Overal bed mobility: Modified Independent Bed Mobility: Supine to Sit, Sit to Supine     Supine to sit: Supervision Sit to supine: Supervision   General bed mobility  comments: no vc or physical assist needed    Transfers Overall transfer level: Needs assistance Equipment used: None Transfers: Sit to/from Stand Sit to Stand: Supervision           General transfer comment: supervision for STS no AD    Ambulation/Gait Ambulation/Gait assistance: Contact guard assist, Supervision Gait Distance (Feet): 350 Feet Assistive device: Rolling walker (2 wheels) Gait Pattern/deviations: Step-through pattern, Trunk flexed       General Gait Details: no LOB or concern for dizziness; CGA/supervision throughout   Stairs             Wheelchair Mobility     Tilt Bed    Modified Rankin (Stroke Patients Only)       Balance Overall balance assessment: Needs assistance Sitting-balance support: No upper extremity supported, Feet supported Sitting balance-Leahy Scale: Good Sitting balance - Comments: no LOB or concern for dizziness; no sway noted   Standing balance support: Single extremity supported, During functional activity, Bilateral upper extremity supported Standing balance-Leahy Scale: Fair Standing balance comment: can stand with QC or RW; per pt she feels more confident with RW                            Communication Communication Communication: No apparent difficulties  Cognition Arousal: Alert Behavior During Therapy: WFL for tasks assessed/performed   PT - Cognitive impairments: No apparent impairments                         Following commands: Intact  Cueing Cueing Techniques: Verbal cues  Exercises Other Exercises Other Exercises: amb for 361ft with RW and CGA/supervision    General Comments        Pertinent Vitals/Pain Pain Assessment Pain Assessment: No/denies pain    Home Living                          Prior Function            PT Goals (current goals can now be found in the care plan section) Acute Rehab PT Goals Patient Stated Goal: regain leg strength,  avoid additional falls. Time For Goal Achievement: 04/02/24 Potential to Achieve Goals: Good Progress towards PT goals: Progressing toward goals    Frequency    Min 2X/week      PT Plan      Co-evaluation              AM-PAC PT 6 Clicks Mobility   Outcome Measure  Help needed turning from your back to your side while in a flat bed without using bedrails?: None Help needed moving from lying on your back to sitting on the side of a flat bed without using bedrails?: None Help needed moving to and from a bed to a chair (including a wheelchair)?: None Help needed standing up from a chair using your arms (e.g., wheelchair or bedside chair)?: None Help needed to walk in hospital room?: A Little Help needed climbing 3-5 steps with a railing? : A Little 6 Click Score: 22    End of Session Equipment Utilized During Treatment: Gait belt Activity Tolerance: Patient tolerated treatment well Patient left: in bed;with call bell/phone within reach;with bed alarm set Nurse Communication: Mobility status PT Visit Diagnosis: Unsteadiness on feet (R26.81);Other abnormalities of gait and mobility (R26.89);Difficulty in walking, not elsewhere classified (R26.2)     Time: 8652-8592 PT Time Calculation (min) (ACUTE ONLY): 20 min  Charges:                            Allena Bulls, SPT  Maryanne Finder, PT, DPT Physical Therapist - [facility]  [facility] 03/24/2024, 2:26 PM

## 2024-03-24 NOTE — Discharge Summary (Signed)
 Physician Discharge Summary   Patient: Barbara Santos MRN: [id number]  DOB: [birth year]   Admit:     Date of Admission: 03/18/2024 Admitted from: home   Discharge: Date of discharge: 03/24/24 Disposition: Skilled nursing facility Condition at discharge: good  CODE STATUS: DNR     Discharge Physician: Laneta Blunt, DO [facility]     PCP: Edman Blunt PARAS, DO  Recommendations for Outpatient Follow-up:  Follow up with PCP Edman Blunt PARAS, DO in 1-2 weeks will need sodium recheck, endocrinology follow up         Discharge Diagnoses: Principal Problem:   Hyponatremia Active Problems:   Hypothyroidism   Hyperlipidemia   Essential hypertension       Hospital Course: Hospital course / significant events:   Barbara Santos is a 88 y.o. year old female with medical history of HTN, HLD, T2DM presenting to the ED after a fall.   HPI: Pt states she had a fall when her legs gave out.  She fell outside and tried to break her fall where she ended up hitting her knees and elbows which resulted in a laceration.  She says she did not hit her head or had any loss of consciousness.  She was able to get up but just felt weak.  She denies any palpitations, dizziness or lightheadedness around this time.  Patient reports a good appetite but discussing with son and daughter-in-law they report patient has decreased appetite and has lost weight recently.  It appears patient's metformin  was recently increased on 10/15.   11/25: admitted w/ recurrent fall / weakness and hyponatremia  11/26: no significant improvement in sodium, cortisol normal, suspect SIADH, resumed normal saline, following BMP  11/27: sodium corrected for hyperglycemia 124-126. Glc worsening. Added sliding scale and will get A1C, last A1c not indicative of poor control so will check now in case substantially worse. Glc pm also corrects to 124-126. Fluid restricting, adding salt  tabs, eval for malignancy w/ CT C/A/P 11/28: remains hyperglycemic but some improved. Sodium continues to correct to about 125-126. CT thankfully no malignancy, she will need follow up lung nodules, question if this + emphysema explains SIADH. A1C still not resulted. Sodium this afternoon corrected 128-129 11/29: sodium corrects to 131 last night, to 134 today this morning. Increase intervals for BMP to q12h. Eased fluid restriction to 1800 mL/24h up from 1500. A1C 8.0 which doesn't seem to explain dramatic hyperglycemia here, keep plan for outpatient endocrine follow up but Glc control is improved on current insulin  regimen 11/30: doing well, sodium and Glc trending appropriately, continue current orders and await SNF placement       Consultants:  none  Procedures/Surgeries: none      ASSESSMENT & PLAN:   Acute on chronic hyponatremia Pseudohyponatremia in setting of hyperglycemia but still corrected sodium remains low  Hypotonic hyponatremia  Baseline Na about 132 No thiazide, normal TSH, no hypervolemia Cortisol level --> WNL Uosm and UNa up = in range for SIADH / reset osmostat  History = concern for tea/toast diet, low sodium intake, low nutrient intake  FE Uric Acid (>12% also points to SIADH) - 16.52% indicative of SIADH  question paraneoplastic? --> CT C/A/P no evidence for malignancy but did show lung nodules and emphysema see below Fluid restricting 1800 mL/d continue salt tabs reduce bid  May benefit from outpatient endocrinology  Significant imaging: CT C/A/P no evidence for malignancy but did show lung nodules and emphysema, question if this explains  the SIADH Outpatient follow up Will need repeat CT chest to follow nodules  Type 2 diabetes mellitus:  Recently increase in metformin  which can have effect on appetite and may have led to decreased oral intake for this patient.  Given A1c recently was less than 8, felt comfortable holding home meds but Glc then  significantly higher  Continue insulin  mealtime SSI + basal May benefit from outpatient endocrinology  Fall: Likely mechanical, however unsteadiness may be d/t low sodium.  PT and OT consulted - recs for SNF rehab    Hypothyroidism:TSH WNL no concern for hypothryoid contributing to hyponatremia Continue home Synthroid    Hyperlipidemia:  Continue home statin  Hypomagnesemia replace   Hypertension:  BP high here Increased home ARB IV hydralazine  prn    Intermittent esential tremors. Continue primidone    Cholelithiasis without acute cholecystitis, and Renal cyst on CT - not clinically significant at this time No follow up needed   underweight based on BMI: Body mass index is 16.25 kg/m.Barbara Santos Significantly low or high BMI is associated with higher medical risk.  Suspect severe malnutrition Underweight - under 18  overweight - 25 to 29 obese - 30 or more Class 1 obesity: BMI of 30.0 to 34 Class 2 obesity: BMI of 35.0 to 39 Class 3 obesity: BMI of 40.0 to 49 Super Morbid Obesity: BMI 50-59 Super-super Morbid Obesity: BMI 60+ Healthy nutrition and physical activity advised as adjunct to other disease management and risk reduction treatments Dietician consult    DVT prophylaxis: lovenox  IV fluids: normal saline continuous IV fluids  Nutrition: regular (dietician consult given significant underweight) w/ fluid restrict  Central lines / other devices: none  Code Status: DNR ACP documentation reviewed:  none on file in [facility]  [facility] needs: SNF placment  Medical barriers to dispo: none.            Discharge Instructions  Allergies as of 03/24/2024       Reactions   Amoxicillin Rash   Penicillins Rash        Medication List     STOP taking these medications    diphenhydrAMINE 25 MG tablet Commonly known as: BENADRYL   ketoconazole  2 % cream Commonly known as: NIZORAL    metFORMIN  500 MG 24 hr tablet Commonly known as: GLUCOPHAGE -XR   OneTouch Delica  Lancets Fine Misc   OneTouch Verio test strip Generic drug: glucose blood       TAKE these medications    bisacodyl  10 MG suppository Commonly known as: DULCOLAX Place 1 suppository (10 mg total) rectally daily as needed for moderate constipation.   dorzolamide -timolol  2-0.5 % ophthalmic solution Commonly known as: COSOPT  Place 1 drop into both eyes 2 (two) times daily.   insulin  aspart 100 UNIT/ML injection Commonly known as: novoLOG  Inject 5 Units into the skin 3 (three) times daily with meals.   insulin  aspart 100 UNIT/ML injection Commonly known as: novoLOG  CBG 70 - 120: 0 units CBG 121 - 150: 2 units CBG 151 - 200: 3 units CBG 201 - 250: 5 units CBG 251 - 300: 8 units CBG 301 - 350: 11 units CBG 351 - 400: 15 units CBG > 400: call MD and obtain verification   insulin  glargine-yfgn 100 UNIT/ML injection Commonly known as: SEMGLEE  Inject 0.1 mLs (10 Units total) into the skin daily at 10 pm.   lactose free nutrition Liqd Take 237 mLs by mouth 3 (three) times daily between meals. What changed: Another medication with the same name was removed. Continue  taking this medication, and follow the directions you see here.   levothyroxine  50 MCG tablet Commonly known as: SYNTHROID  Take 1 tablet (50 mcg total) by mouth daily before breakfast.   magnesium  chloride 64 MG Tbec SR tablet Commonly known as: SLOW-MAG Take 1 tablet (64 mg total) by mouth daily. Start taking on: March 25, 2024   multivitamin tablet Take 1 tablet by mouth daily.   polyethylene glycol powder 17 GM/SCOOP powder Commonly known as: GLYCOLAX /MIRALAX  Take 17-34 g by mouth daily as needed for moderate constipation. May increase up to 3 to 4 capfuls if needed short term for constipation.   primidone  50 MG tablet Commonly known as: MYSOLINE  Take 0.5-1 tablets (25-50 mg total) by mouth at bedtime. For tremors   rosuvastatin  5 MG tablet Commonly known as: CRESTOR  Take 1 tablet (5 mg total) by mouth at  bedtime.   sodium chloride  1 g tablet Take 1 tablet (1 g total) by mouth 2 (two) times daily with a meal.   thiamine  100 MG tablet Commonly known as: VITAMIN B1 Take 1 tablet (100 mg total) by mouth daily. Start taking on: March 25, 2024   traZODone  50 MG tablet Commonly known as: DESYREL  Take 1-2 tablets (50-100 mg total) by mouth at bedtime as needed for sleep.   valsartan  40 MG tablet Commonly known as: DIOVAN  Take 1.5 tablets (60 mg total) by mouth daily.   Vitamin D 50 MCG (2000 UT) tablet Take 2,000 Units by mouth daily.         Contact information for after-discharge care     Destination     [facility] [place], [place]. Barbara Santos   Service: Skilled Nursing Contact information: [address] [place]  [address] [phone]                     Allergies  Allergen Reactions   Amoxicillin Rash   Penicillins Rash     Subjective: pt feeling well today, eager for dc when possible, tolerating diet, had BM yesterday, no CP/SOB, just feeling weak generalized nonfocal    Discharge Exam: BP 128/70 (BP Location: Right Arm)   Pulse 73   Temp 98.4 F (36.9 C) (Oral)   Resp 19   Ht 5' 1 (1.549 m)   Wt 41.9 kg   SpO2 98%   BMI 17.45 kg/m  General: Pt is alert, awake, not in acute distress Cardiovascular: RRR, S1/S2 +, no rubs, no gallops Respiratory: CTA bilaterally, no wheezing, no rhonchi Abdominal: Soft, NT, ND, bowel sounds + Extremities: no edema, no cyanosis     The results of significant diagnostics from this hospitalization (including imaging, microbiology, ancillary and laboratory) are listed below for reference.     Microbiology: No results found for this or any previous visit (from the past 240 hours).   Labs: BNP (last 3 results) No results for input(s): BNP in the last 8760 hours. Basic Metabolic Panel: Recent Labs  Lab 03/18/24 2320 03/19/24 0604 03/20/24 0521 03/20/24 1127 03/21/24 0405 03/21/24 1029  03/21/24 1552 03/21/24 2352 03/22/24 0634 03/22/24 0826 03/22/24 2150 03/24/24 0447  NA 125*   < > 121*   < > 123*   < > 127* 129*  --  132* 130* 132*  K  --    < > 3.9   < > 4.5   < > 4.1 4.2  --  5.0 4.9 4.6  CL  --    < > 90*   < > 92*   < >  93* 97*  --  96* 97* 97*  CO2  --    < > 20*   < > 24   < > 24 25  --  29 26 27   GLUCOSE  --    < > 301*   < > 216*   < > 294* 195*  --  185* 173* 123*  BUN  --    < > 13   < > 22   < > 16 22  --  17 20 21   CREATININE  --    < > 0.44   < > 0.56   < > 0.45 0.46  --  0.47 0.43* 0.52  CALCIUM   --    < > 9.0   < > 8.8*   < > 9.0 9.1  --  9.5 9.5 9.4  MG 1.4*  --  1.9  --  1.8  --   --   --  1.8  --   --   --   PHOS  --   --  2.6  --  2.7  --   --   --  2.8  --   --   --    < > = values in this interval not displayed.   Liver Function Tests: Recent Labs  Lab 03/18/24 1842  AST 26  ALT 20  ALKPHOS 73  BILITOT 0.4  PROT 6.6  ALBUMIN 4.4   No results for input(s): LIPASE, AMYLASE in the last 168 hours. No results for input(s): AMMONIA in the last 168 hours. CBC: Recent Labs  Lab 03/18/24 1842 03/19/24 0604  WBC 8.3 7.8  NEUTROABS 5.4  --   HGB 11.5* 12.0  HCT 33.1* 34.4*  MCV 89.5 88.9  PLT 244 257   Cardiac Enzymes: No results for input(s): CKTOTAL, CKMB, CKMBINDEX, TROPONINI in the last 168 hours. BNP: Invalid input(s): POCBNP CBG: Recent Labs  Lab 03/23/24 1200 03/23/24 1623 03/23/24 2220 03/24/24 0920 03/24/24 1157  GLUCAP 182* 167* 176* 229* 144*   D-Dimer No results for input(s): DDIMER in the last 72 hours. Hgb A1c No results for input(s): HGBA1C in the last 72 hours. Lipid Profile No results for input(s): CHOL, HDL, LDLCALC, TRIG, CHOLHDL, LDLDIRECT in the last 72 hours. Thyroid  function studies No results for input(s): TSH, T4TOTAL, T3FREE, THYROIDAB in the last 72 hours.  Invalid input(s): FREET3 Anemia work up No results for input(s): VITAMINB12, FOLATE,  FERRITIN, TIBC, IRON, RETICCTPCT in the last 72 hours. Urinalysis    Component Value Date/Time   COLORURINE YELLOW (A) 03/18/2024 1922   APPEARANCEUR CLEAR (A) 03/18/2024 1922   LABSPEC 1.014 03/18/2024 1922   PHURINE 7.0 03/18/2024 1922   GLUCOSEU 150 (A) 03/18/2024 1922   HGBUR NEGATIVE 03/18/2024 1922   BILIRUBINUR NEGATIVE 03/18/2024 1922   BILIRUBINUR Negative 01/07/2020 0933   KETONESUR NEGATIVE 03/18/2024 1922   PROTEINUR NEGATIVE 03/18/2024 1922   UROBILINOGEN 0.2 01/07/2020 0933   NITRITE NEGATIVE 03/18/2024 1922   LEUKOCYTESUR TRACE (A) 03/18/2024 1922   Sepsis Labs Recent Labs  Lab 03/18/24 1842 03/19/24 0604  WBC 8.3 7.8   Microbiology No results found for this or any previous visit (from the past 240 hours). Imaging CT Cervical Spine Wo Contrast Result Date: 03/18/2024 EXAM: CT CERVICAL SPINE WITHOUT CONTRAST 03/18/2024 07:00:54 PM TECHNIQUE: CT of the cervical spine was performed without the administration of intravenous contrast. Multiplanar reformatted images are provided for review. Automated exposure control, iterative reconstruction, and/or weight based adjustment of the mA/kV  was utilized to reduce the radiation dose to as low as reasonably achievable. COMPARISON: None available. CLINICAL HISTORY: Neck trauma (Age >= 65y) FINDINGS: CERVICAL SPINE: BONES AND ALIGNMENT: Straightening of the normal cervical lordosis. No evidence of traumatic malalignment. No acute fracture. DEGENERATIVE CHANGES: Disc space narrowing most pronounced at C4-C5, C6-C7, and C7-T1. Degenerative endplate osteophytes at multiple levels. There is no high grade osseous spinal canal stenosis. Facet arthrosis and no facet hypertrophy at multiple levels. Foraminal stenosis most pronounced at C5-C6. SOFT TISSUES: No prevertebral soft tissue swelling. LUNGS: Biapical pleural parenchymal scarring. IMPRESSION: 1. No acute abnormality of the cervical spine related to the reported neck trauma. 2.  Degenerative changes as above. Electronically signed by: Donnice Mania MD 03/18/2024 07:31 PM EST RP Workstation: [id number]   CT Head Wo Contrast Result Date: 03/18/2024 EXAM: CT HEAD WITHOUT 03/18/2024 07:00:54 PM TECHNIQUE: CT of the head was performed without the administration of intravenous contrast. Automated exposure control, iterative reconstruction, and/or weight based adjustment of the mA/kV was utilized to reduce the radiation dose to as low as reasonably achievable. COMPARISON: None available. CLINICAL HISTORY: Head trauma, minor (Age >= 65y) FINDINGS: BRAIN AND VENTRICLES: Age related atrophy. No acute intracranial hemorrhage. No mass effect or midline shift. No extra-axial fluid collection. No evidence of acute infarct. No hydrocephalus. ORBITS: Bilateral cataract resection. No acute abnormality. SINUSES AND MASTOIDS: No acute abnormality. SOFT TISSUES AND SKULL: Atherosclerosis of skullbase vasculature without hyperdense vessel or abnormal calcification. No acute skull fracture. No acute soft tissue abnormality. IMPRESSION: 1. No acute intracranial abnormality related to head trauma. Electronically signed by: Donnice Mania MD 03/18/2024 07:26 PM EST RP Workstation: [id number]   DG Knee Complete 4 Views Left Result Date: 03/18/2024 EXAM: 4 OR MORE VIEW(S) Xray of the Left Knee 03/18/2024 06:05:29 PM COMPARISON: None available. CLINICAL HISTORY: fall fall FINDINGS: BONES AND JOINTS: No acute fracture. No focal osseous lesion. No joint dislocation. No significant joint effusion. No significant degenerative changes. SOFT TISSUES: The soft tissues are unremarkable. IMPRESSION: 1. No significant abnormality. Electronically signed by: Elsie Gravely MD 03/18/2024 06:26 PM EST RP Workstation: [id number]   DG Elbow Complete Left Result Date: 03/18/2024 EXAM: 3 VIEW(S) XRAY OF THE LEFT ELBOW COMPARISON: None available. CLINICAL HISTORY: Fall Fall FINDINGS: BONES AND JOINTS: No acute fracture. No  focal osseous lesion. No joint dislocation. No joint effusion. SOFT TISSUES: The soft tissues are unremarkable. IMPRESSION: 1. No acute abnormality. Electronically signed by: Elsie Gravely MD 03/18/2024 06:25 PM EST RP Workstation: [id number]      Time coordinating discharge: over 30 minutes  SIGNED:  Srikar Chiang DO [facility]

## 2024-03-24 NOTE — Plan of Care (Signed)

## 2024-03-24 NOTE — TOC Transition Note (Signed)
 Transition of Care [facility]) - Discharge Note   Patient Details  Name: Barbara Santos MRN: [id number] Date of Birth: Dec 06, 1935  [facility] CM/SW Contact:  Alfonso Rummer, LCSW Phone Number: 03/24/2024, 1:17 PM   Clinical Narrative:    Pt will transition to [facility] for short term rehab. Pt son will transport patient via private car.   Final next level of care: Skilled Nursing Facility Barriers to Discharge: Barriers Resolved   Patient Goals and CMS Choice     Choice offered to / list presented to : Patient      Discharge Placement PASRR number recieved: 03/24/24            Patient chooses bed at: [facility] [place] Patient to be transferred to facility by: pt son golie Carsey Name of family member notified: pt son golie Schmieg Patient and family notified of of transfer: 03/24/24  Discharge Plan and Services Additional resources added to the After Visit Summary for                                       Social Drivers of Health (SDOH) Interventions SDOH Screenings   Food Insecurity: No Food Insecurity (03/19/2024)  Housing: Low Risk  (03/19/2024)  Transportation Needs: No Transportation Needs (03/19/2024)  Utilities: Not At Risk (03/19/2024)  Alcohol Screen: Low Risk  (01/31/2024)  Depression (PHQ2-9): Low Risk  (01/31/2024)  Financial Resource Strain: Low Risk  (01/31/2024)  Physical Activity: Inactive (01/31/2024)  Social Connections: Moderately Integrated (03/19/2024)  Stress: No Stress Concern Present (01/31/2024)  Tobacco Use: Low Risk  (03/18/2024)  Health Literacy: Adequate Health Literacy (01/31/2024)     Readmission Risk Interventions     No data to display

## 2024-03-24 NOTE — Progress Notes (Signed)
 Discharge instructions were reviewed with patient and family at bedside., IV was removed. Questions were encouraged and belongings collected by family.

## 2024-03-25 DIAGNOSIS — E46 Unspecified protein-calorie malnutrition: Secondary | ICD-10-CM | POA: Diagnosis not present

## 2024-03-26 ENCOUNTER — Telehealth: Payer: Self-pay

## 2024-03-26 NOTE — Telephone Encounter (Signed)
 Copied from CRM [id number]. Topic: Clinical - Medical Advice >> Mar 25, 2024  4:43 PM Dedra B wrote: Reason for CRM: Pt son, Golie, said pt has blood drawn everyday at [facility]. He wants to know if those labs can be used for Dr. MARLA or does she need to still come for her upcoming lab appt on the 04/04/25.

## 2024-03-26 NOTE — Telephone Encounter (Signed)
 Son advised and verbalized understanding. Cancelled lab appt. Will discuss further at upcoming appt with Dr. MARLA.

## 2024-03-26 NOTE — Telephone Encounter (Signed)
 Please let them know that this is okay to skip the blood before her next apt with me if that is what they prefer.  Technically the labs that I am checking would also include Cholesterol panel. That is the main difference.  The other labs were checked in the hospital already. So it is okay to skip mine.  But I will say, sometimes when patients leave the hospital / nursing home - they do ask us  to re-check some lab work.  So we may discuss labs again at her actual apt. But yes we can cancel the Lab Only apt if that is what they prefer.  Marsa Officer, DO [facility] 03/26/2024, 11:21 AM

## 2024-03-27 DIAGNOSIS — E119 Type 2 diabetes mellitus without complications: Secondary | ICD-10-CM | POA: Diagnosis not present

## 2024-03-27 DIAGNOSIS — E871 Hypo-osmolality and hyponatremia: Secondary | ICD-10-CM | POA: Diagnosis not present

## 2024-03-27 DIAGNOSIS — I10 Essential (primary) hypertension: Secondary | ICD-10-CM | POA: Diagnosis not present

## 2024-03-27 DIAGNOSIS — E039 Hypothyroidism, unspecified: Secondary | ICD-10-CM | POA: Diagnosis not present

## 2024-03-28 DIAGNOSIS — R945 Abnormal results of liver function studies: Secondary | ICD-10-CM | POA: Diagnosis not present

## 2024-03-28 DIAGNOSIS — E871 Hypo-osmolality and hyponatremia: Secondary | ICD-10-CM | POA: Diagnosis not present

## 2024-03-28 DIAGNOSIS — E119 Type 2 diabetes mellitus without complications: Secondary | ICD-10-CM | POA: Diagnosis not present

## 2024-03-28 DIAGNOSIS — D649 Anemia, unspecified: Secondary | ICD-10-CM | POA: Diagnosis not present

## 2024-04-01 DIAGNOSIS — E875 Hyperkalemia: Secondary | ICD-10-CM | POA: Diagnosis not present

## 2024-04-01 DIAGNOSIS — E871 Hypo-osmolality and hyponatremia: Secondary | ICD-10-CM | POA: Diagnosis not present

## 2024-04-01 DIAGNOSIS — E1100 Type 2 diabetes mellitus with hyperosmolarity without nonketotic hyperglycemic-hyperosmolar coma (NKHHC): Secondary | ICD-10-CM | POA: Diagnosis not present

## 2024-04-02 DIAGNOSIS — E871 Hypo-osmolality and hyponatremia: Secondary | ICD-10-CM | POA: Diagnosis not present

## 2024-04-02 DIAGNOSIS — E1100 Type 2 diabetes mellitus with hyperosmolarity without nonketotic hyperglycemic-hyperosmolar coma (NKHHC): Secondary | ICD-10-CM | POA: Diagnosis not present

## 2024-04-02 DIAGNOSIS — E875 Hyperkalemia: Secondary | ICD-10-CM | POA: Diagnosis not present

## 2024-04-02 DIAGNOSIS — I10 Essential (primary) hypertension: Secondary | ICD-10-CM | POA: Diagnosis not present

## 2024-04-04 ENCOUNTER — Other Ambulatory Visit

## 2024-04-07 ENCOUNTER — Telehealth: Payer: Self-pay

## 2024-04-07 NOTE — Transitions of Care (Post Inpatient/ED Visit) (Signed)
° °  04/07/2024  Name: Barbara Santos MRN: [id number] DOB: 1936/01/23  Today's TOC FU Call Status: Today's TOC FU Call Status:: Unsuccessful Call (1st Attempt) Unsuccessful Call (1st Attempt) Date: 04/07/24  Attempted to reach the patient regarding the most recent Inpatient/ED visit.  Follow Up Plan: Additional outreach attempts will be made to reach the patient to complete the Transitions of Care (Post Inpatient/ED visit) call.   Signature Julian Lemmings, LPN [facility] Nurse Health Advisor Direct Dial [phone]

## 2024-04-07 NOTE — Transitions of Care (Post Inpatient/ED Visit) (Signed)
 04/07/2024  Name: Barbara Santos MRN: [id number] DOB: 12-04-1935  Today's TOC FU Call Status: Today's TOC FU Call Status:: Successful TOC FU Call Completed Unsuccessful Call (1st Attempt) Date: 04/07/24 [facility] FU Call Complete Date: 04/07/24  Patient's Name and Date of Birth confirmed. Name, DOB  Transition Care Management Follow-up Telephone Call Date of Discharge: 04/04/24 Discharge Facility: Other [facility]) Name of Other ([facility]) Discharge Facility: [facility] Type of Discharge: Inpatient Admission Primary Inpatient Discharge Diagnosis:: tremors How have you been since you were released from the hospital?: Better Any questions or concerns?: No  Items Reviewed: Did you receive and understand the discharge instructions provided?: Yes Medications obtained,verified, and reconciled?: Yes (Medications Reviewed) Any new allergies since your discharge?: No Dietary orders reviewed?: Yes Do you have support at home?: Yes People in Home [RPT]: child(ren), adult  Medications Reviewed Today: Medications Reviewed Today     Reviewed by Emmitt Pan, LPN (Licensed Practical Nurse) on 04/07/24 at 1034  Med List Status: <None>   Medication Order Taking? Sig Documenting Provider Last Dose Status Informant  bisacodyl  (DULCOLAX) 10 MG suppository [id number] Yes Place 1 suppository (10 mg total) rectally daily as needed for moderate constipation. Alexander, Natalie, DO  Active   Cholecalciferol  (VITAMIN D) 2000 UNITS tablet [id number] Yes Take 2,000 Units by mouth daily. [provider]  Active Self  dorzolamide -timolol  (COSOPT ) 22.3-6.8 MG/ML ophthalmic solution [id number] Yes Place 1 drop into both eyes 2 (two) times daily.  [provider]  Active Self           Med Note PECOLA, GRAYCE RAMAN   Thu Oct 21, 2015  8:48 AM) Received from: External Pharmacy Received Sig: INSTILL ONE DROP IN OU BID  insulin  aspart (NOVOLOG ) 100 UNIT/ML injection [id number] Yes  Inject 5 Units into the skin 3 (three) times daily with meals. Alexander, Natalie, DO  Active   insulin  aspart (NOVOLOG ) 100 UNIT/ML injection [id number] Yes CBG 70 - 120: 0 units CBG 121 - 150: 2 units CBG 151 - 200: 3 units CBG 201 - 250: 5 units CBG 251 - 300: 8 units CBG 301 - 350: 11 units CBG 351 - 400: 15 units CBG > 400: call MD and obtain verification Alexander, Natalie, DO  Active   insulin  glargine-yfgn (SEMGLEE ) 100 UNIT/ML injection [id number] Yes Inject 0.1 mLs (10 Units total) into the skin daily at 10 pm. Alexander, Natalie, DO  Active   lactose free nutrition (BOOST) LIQD [id number] Yes Take 237 mLs by mouth 3 (three) times daily between meals. [provider]  Active Self           Med Note (HILL, TIFFANY A   Tue Oct 24, 2016 10:06 AM) Drinks 1 a day  levothyroxine  (SYNTHROID ) 50 MCG tablet [id number] Yes Take 1 tablet (50 mcg total) by mouth daily before breakfast. Edman Marsa PARAS, DO  Active Self  magnesium  chloride (SLOW-MAG) 64 MG TBEC SR tablet [id number] Yes Take 1 tablet (64 mg total) by mouth daily. Alexander, Natalie, DO  Active   Multiple Vitamin (MULTIVITAMIN) tablet [id number] Yes Take 1 tablet by mouth daily. [provider]  Active Self  polyethylene glycol powder (GLYCOLAX /MIRALAX ) 17 GM/SCOOP powder [id number] Yes Take 17-34 g by mouth daily as needed for moderate constipation. May increase up to 3 to 4 capfuls if needed short term for constipation. Edman Marsa PARAS, DO  Active Self  primidone  (MYSOLINE ) 50 MG tablet [id number] Yes Take 0.5-1 tablets (25-50 mg total) by mouth at bedtime. For tremors  Edman Marsa PARAS, DO  Active Self  rosuvastatin  (CRESTOR ) 5 MG tablet [id number] Yes Take 1 tablet (5 mg total) by mouth at bedtime. Edman Marsa PARAS, DO  Active Self  sodium chloride  1 g tablet [id number] Yes Take 1 tablet (1 g total) by mouth 2 (two) times daily with a meal. Marsa Edelman, DO  Active   thiamine  (VITAMIN B1) 100  MG tablet [id number] Yes Take 1 tablet (100 mg total) by mouth daily. Alexander, Natalie, DO  Active   traZODone  (DESYREL ) 50 MG tablet [id number] Yes Take 1-2 tablets (50-100 mg total) by mouth at bedtime as needed for sleep. Alexander, Natalie, DO  Active   valsartan  (DIOVAN ) 40 MG tablet [id number] Yes Take 1.5 tablets (60 mg total) by mouth daily. Edman Marsa PARAS, DO  Active Self            Home Care and Equipment/Supplies: Were Home Health Services Ordered?: NA Any new equipment or medical supplies ordered?: NA  Functional Questionnaire: Do you need assistance with bathing/showering or dressing?: No Do you need assistance with meal preparation?: No Do you need assistance with eating?: No Do you have difficulty maintaining continence: No Do you need assistance with getting out of bed/getting out of a chair/moving?: No Do you have difficulty managing or taking your medications?: No  Follow up appointments reviewed: PCP Follow-up appointment confirmed?: Yes Date of PCP follow-up appointment?: 04/10/24 Follow-up Provider: [facility] Follow-up appointment confirmed?: NA Do you need transportation to your follow-up appointment?: No Do you understand care options if your condition(s) worsen?: Yes-patient verbalized understanding    SIGNATURE Julian Lemmings, LPN [facility] Nurse Health Advisor Direct Dial [phone]

## 2024-04-09 ENCOUNTER — Ambulatory Visit: Payer: Self-pay

## 2024-04-09 ENCOUNTER — Emergency Department
Admission: EM | Admit: 2024-04-09 | Discharge: 2024-04-09 | Disposition: A | Discharge status: Home / Self Care | Source: Home / Self Care | Attending: Emergency Medicine | Admitting: Emergency Medicine

## 2024-04-09 ENCOUNTER — Other Ambulatory Visit: Payer: Self-pay

## 2024-04-09 DIAGNOSIS — E871 Hypo-osmolality and hyponatremia: Secondary | ICD-10-CM | POA: Diagnosis present

## 2024-04-09 DIAGNOSIS — E11649 Type 2 diabetes mellitus with hypoglycemia without coma: Secondary | ICD-10-CM | POA: Insufficient documentation

## 2024-04-09 DIAGNOSIS — Z794 Long term (current) use of insulin: Secondary | ICD-10-CM | POA: Diagnosis not present

## 2024-04-09 DIAGNOSIS — E039 Hypothyroidism, unspecified: Secondary | ICD-10-CM | POA: Diagnosis not present

## 2024-04-09 DIAGNOSIS — R600 Localized edema: Secondary | ICD-10-CM | POA: Diagnosis not present

## 2024-04-09 DIAGNOSIS — E162 Hypoglycemia, unspecified: Secondary | ICD-10-CM

## 2024-04-09 DIAGNOSIS — R531 Weakness: Secondary | ICD-10-CM

## 2024-04-09 LAB — CBC WITH DIFFERENTIAL/PLATELET
Abs Immature Granulocytes: 0.03 K/uL (ref 0.00–0.07)
Basophils Absolute: 0 K/uL (ref 0.0–0.1)
Basophils Relative: 1 %
Eosinophils Absolute: 0.1 K/uL (ref 0.0–0.5)
Eosinophils Relative: 1 %
HCT: 31.4 % — ABNORMAL LOW (ref 36.0–46.0)
Hemoglobin: 10.8 g/dL — ABNORMAL LOW (ref 12.0–15.0)
Immature Granulocytes: 1 %
Lymphocytes Relative: 16 %
Lymphs Abs: 1 K/uL (ref 0.7–4.0)
MCH: 32 pg (ref 26.0–34.0)
MCHC: 34.4 g/dL (ref 30.0–36.0)
MCV: 93.2 fL (ref 80.0–100.0)
Monocytes Absolute: 0.8 K/uL (ref 0.1–1.0)
Monocytes Relative: 12 %
Neutro Abs: 4.5 K/uL (ref 1.7–7.7)
Neutrophils Relative %: 69 %
Platelets: 257 K/uL (ref 150–400)
RBC: 3.37 MIL/uL — ABNORMAL LOW (ref 3.87–5.11)
RDW: 14.3 % (ref 11.5–15.5)
WBC: 6.4 K/uL (ref 4.0–10.5)
nRBC: 0 % (ref 0.0–0.2)

## 2024-04-09 LAB — URINALYSIS, ROUTINE W REFLEX MICROSCOPIC
Bacteria, UA: NONE SEEN
Bilirubin Urine: NEGATIVE
Glucose, UA: >=500 mg/dL — AB
Hgb urine dipstick: NEGATIVE
Ketones, ur: 20 mg/dL — AB
Leukocytes,Ua: NEGATIVE
Nitrite: NEGATIVE
Protein, ur: NEGATIVE mg/dL
Specific Gravity, Urine: 1.008 (ref 1.005–1.030)
Squamous Epithelial / HPF: 0 /HPF (ref 0–5)
pH: 7 (ref 5.0–8.0)

## 2024-04-09 LAB — PRO BRAIN NATRIURETIC PEPTIDE: Pro Brain Natriuretic Peptide: 255 pg/mL (ref <300.0)

## 2024-04-09 LAB — BASIC METABOLIC PANEL WITH GFR
Anion gap: 10 (ref 5–15)
BUN: 19 mg/dL (ref 8–23)
CO2: 26 mmol/L (ref 22–32)
Calcium: 9.4 mg/dL (ref 8.9–10.3)
Chloride: 97 mmol/L — ABNORMAL LOW (ref 98–111)
Creatinine, Ser: 0.46 mg/dL (ref 0.44–1.00)
GFR, Estimated: >60 mL/min (ref >60)
Glucose, Bld: 155 mg/dL — ABNORMAL HIGH (ref 70–99)
Potassium: 4.5 mmol/L (ref 3.5–5.1)
Sodium: 133 mmol/L — ABNORMAL LOW (ref 135–145)

## 2024-04-09 LAB — CBG MONITORING, ED
Glucose-Capillary: 107 mg/dL — ABNORMAL HIGH (ref 70–99)
Glucose-Capillary: 151 mg/dL — ABNORMAL HIGH (ref 70–99)

## 2024-04-09 LAB — TROPONIN T, HIGH SENSITIVITY: Troponin T High Sensitivity: <15 ng/L (ref 0–19)

## 2024-04-09 NOTE — Discharge Instructions (Addendum)
 Make sure to go to your primary care doctor follow-up tomorrow.

## 2024-04-09 NOTE — ED Provider Notes (Signed)
 [facility] Provider Note    Event Date/Time   First MD Initiated Contact with Patient 04/09/24 2155     (approximate)   History   Hypoglycemia   HPI  Barbara Santos is a 88 y.o. female with history of diabetes, hypothyroidism, presenting with hypoglycemia.  Son who reported the patient felt weak and clammy while doing grocery shopping today.  States that she ate some peanut butter crackers.  After she got home took her blood glucose that was 70.  When her son got there, he rechecked her blood glucose and it was 69, gave her additional peanut butter crackers as well as some juice.  Patient states that after her initial weakness, those symptoms have resolved.  She denies any lightheadedness or chest pain, no shortness of breath or urinary symptoms, no nausea vomiting or diarrhea.  Independent history obtained from son as above.  She was recently placed on insulin  after an admission in November.  Is due to follow-up with her primary care doctor tomorrow.  On independent review, she was admitted in late November early December for weakness and hyponatremia.  Hyponatremia is thought to be secondary to SIADH.     Physical Exam   Triage Vital Signs: ED Triage Vitals  Encounter Vitals Group     BP 04/09/24 1700 (!) 162/80     Girls Systolic BP Percentile --      Girls Diastolic BP Percentile --      Boys Systolic BP Percentile --      Boys Diastolic BP Percentile --      Pulse Rate 04/09/24 1700 78     Resp 04/09/24 1700 17     Temp 04/09/24 1700 97.9 F (36.6 C)     Temp src --      SpO2 04/09/24 1700 100 %     Weight 04/09/24 1704 90 lb (40.8 kg)     Height 04/09/24 1704 5' 1 (1.549 m)     Head Circumference --      Peak Flow --      Pain Score 04/09/24 1701 0     Pain Loc --      Pain Education --      Exclude from Growth Chart --     Most recent vital signs: Vitals:   04/09/24 1700 04/09/24 2205  BP: (!) 162/80 (!) 197/96  Pulse: 78  86  Resp: 17 11  Temp: 97.9 F (36.6 C)   SpO2: 100% 100%     General: Awake, no distress.  CV:  Good peripheral perfusion.  Resp:  Normal effort.  No tachypnea or respiratory distress Abd:  No distention.  Soft nontender Other:  She has bilateral lower extremity edema that appears to be symmetrical, nontoxic appearing.   ED Results / Procedures / Treatments   Labs (all labs ordered are listed, but only abnormal results are displayed) Labs Reviewed  CBC WITH DIFFERENTIAL/PLATELET - Abnormal; Notable for the following components:      Result Value   RBC 3.37 (*)    Hemoglobin 10.8 (*)    HCT 31.4 (*)    All other components within normal limits  BASIC METABOLIC PANEL WITH GFR - Abnormal; Notable for the following components:   Sodium 133 (*)    Chloride 97 (*)    Glucose, Bld 155 (*)    All other components within normal limits  URINALYSIS, ROUTINE W REFLEX MICROSCOPIC - Abnormal; Notable for the following components:   Color,  Urine STRAW (*)    APPearance CLEAR (*)    Glucose, UA >=500 (*)    Ketones, ur 20 (*)    All other components within normal limits  CBG MONITORING, ED - Abnormal; Notable for the following components:   Glucose-Capillary 107 (*)    All other components within normal limits  CBG MONITORING, ED - Abnormal; Notable for the following components:   Glucose-Capillary 151 (*)    All other components within normal limits  PRO BRAIN NATRIURETIC PEPTIDE  CBG MONITORING, ED  TROPONIN T, HIGH SENSITIVITY     EKG  EKG shows, sinus rhythm, rate 75, on QRS, normal QTc, no obvious ischemic ST elevation, right bundle branch block noted, not significantly changed compared to prior    PROCEDURES:  Critical Care performed: No  Procedures   MEDICATIONS ORDERED IN ED: Medications - No data to display   IMPRESSION / MDM / ASSESSMENT AND PLAN / ED COURSE  I reviewed the triage vital signs and the nursing notes.                               Differential diagnosis includes, but is not limited to, insulin  side effect, decreased p.o. intake, electrolyte derangements, UTI, arrhythmia, atypical ACS.  For the lower extremity edema, son noted that this started after she started taking her salt tabs, could be related to that or venous stasis.  She does not have any shortness of breath or chest pain, no known history of CHF. Labs, EKG, troponin, UA.  BNP.  Patient's presentation is most consistent with acute presentation with potential threat to life or bodily function.  Independent interpretation of labs and imaging below.  Discussed with patient and son about lab results, suspect that her symptoms were secondary to hypoglycemia.  She might not be eating as much as when she was in the hospital when she was was placed on insulin .  Did discuss with them about following up with primary care tomorrow to get to get reassessed and to see if she needs any titration of her insulin .  Considered but no indication for inpatient admission at this time, she safe for outpatient management.  Will discharge with strict turn precautions.  Shared decision making done with patient and son and they are agreeable with plan    Clinical Course as of 04/09/24 2323  Wed Apr 09, 2024  2158 Independent review of labs, glucose is 155, electrolytes not severely deranged, no leukocytosis. [TT]  2229 Urinalysis, Routine w reflex microscopic -Urine, Clean Catch(!) Not consistent with UTI. [TT]  2246 Troponin T High Sensitivity: <15 Negative [TT]  2303 Glucose-Capillary(!): 151 Stable, no episodes of hypoglycemia here. [TT]  2316 Pro Brain Natriuretic Peptide: 255.0 Not elevated [TT]    Clinical Course User Index [TT] Waymond Lorelle Cummins, MD     FINAL CLINICAL IMPRESSION(S) / ED DIAGNOSES   Final diagnoses:  Weakness  Hypoglycemia     Rx / DC Orders   ED Discharge Orders     None        Note:  This document was prepared using Dragon voice recognition  software and may include unintentional dictation errors.    Waymond Lorelle Cummins, MD 04/09/24 [phone]

## 2024-04-09 NOTE — ED Triage Notes (Signed)
 Pt to ED via POV with son for c/o hypoglycemia. Per son, pt felt weak and clammy after doing some grocery shopping today. Per son, pt glucose was 69 around 4:00 pm. Glucose currently 104.   At noon, cbg 351 and pt given 11 units insulin  per sliding scale.

## 2024-04-09 NOTE — Telephone Encounter (Signed)
 FYI Only or Action Required?: FYI only for provider: ED advised.  Patient was last seen in primary care on 03/18/2024 by Edman Marsa PARAS, DO.  Called Nurse Triage reporting Blood Sugar Problem.  Symptoms began today.  Interventions attempted: Dietary changes and Other: pt advised to drink juice and eat crackers, pt did so during triage call.  Symptoms are: unchanged.  Triage Disposition: Go to ED Now (Notify PCP)  Patient/caregiver understands and will follow disposition?:    Reason for Disposition  [1] Low blood sugar symptoms persist > 30 minutes AND [2] using low blood sugar Care Advice  Answer Assessment - Initial Assessment Questions 1. SYMPTOMS: What symptoms are you concerned about?     Weakness, sweaty at 1500; BS of 70 at 1545, BS 69 during time of triage 2. ONSET:  When did the symptoms start?     1500 today 3. BLOOD GLUCOSE: What is your blood glucose level?      70 at 1545, 69 now at 1612 (time of triage) 4. USUAL RANGE: What is your blood glucose level usually? (e.g., usual fasting morning value, usual evening value)     Family members unable to specify 5. TYPE 1 or 2:  Do you know what type of diabetes you have?  (e.g., Type 1, Type 2, Gestational; doesn't know)     2 6. INSULIN : Do you take insulin ? What type of insulin (s) do you use? What is the mode of delivery? (syringe, pen; injection or pump) When did you last give yourself an insulin  dose? (i.e., time or hours/minutes ago) How much did you give? (i.e., how many units)     Y  8. OTHER SYMPTOMS: Do you have any symptoms? (e.g., fever, frequent urination, difficulty breathing, vomiting)     Sweaty at this time, was weak earlier  10. FOOD: When did you last eat or drink?       Currently eating shortbread cookie, peanut butter cracker, and apple juice 11. ALONE: Are you alone right now or is someone with you?        2 family members with her   BS 70 at 1545 today, taken after  feeling sweaty and weak Insulin  3x a day, last taken 11u at 1200, became sweaty  Protocols used: Diabetes - Low Blood Sugar-A-AH

## 2024-04-10 ENCOUNTER — Encounter: Payer: Self-pay | Admitting: Family Medicine

## 2024-04-10 ENCOUNTER — Ambulatory Visit: Payer: Self-pay

## 2024-04-10 ENCOUNTER — Other Ambulatory Visit: Payer: Self-pay | Admitting: Family Medicine

## 2024-04-10 ENCOUNTER — Ambulatory Visit: Admitting: Family Medicine

## 2024-04-10 VITALS — BP 136/78 | HR 83 | Ht 61.0 in | Wt 95.0 lb

## 2024-04-10 DIAGNOSIS — J432 Centrilobular emphysema: Secondary | ICD-10-CM | POA: Insufficient documentation

## 2024-04-10 DIAGNOSIS — R918 Other nonspecific abnormal finding of lung field: Secondary | ICD-10-CM | POA: Diagnosis not present

## 2024-04-10 DIAGNOSIS — E1169 Type 2 diabetes mellitus with other specified complication: Secondary | ICD-10-CM | POA: Diagnosis not present

## 2024-04-10 DIAGNOSIS — E871 Hypo-osmolality and hyponatremia: Secondary | ICD-10-CM | POA: Diagnosis not present

## 2024-04-10 DIAGNOSIS — M6281 Muscle weakness (generalized): Secondary | ICD-10-CM

## 2024-04-10 DIAGNOSIS — E222 Syndrome of inappropriate secretion of antidiuretic hormone: Secondary | ICD-10-CM | POA: Insufficient documentation

## 2024-04-10 DIAGNOSIS — Z794 Long term (current) use of insulin: Secondary | ICD-10-CM

## 2024-04-10 MED ORDER — NOVOLOG FLEXPEN 100 UNIT/ML ~~LOC~~ SOPN: PEN_INJECTOR | SUBCUTANEOUS | 0 refills | Status: AC

## 2024-04-10 NOTE — Progress Notes (Signed)
 Subjective:    Patient ID: Barbara Santos, female    DOB: [birth year], 88 y.o.   MRN: [id number]  Barbara Santos is a 88 y.o. female presenting on 04/10/2024 for Hospitalization Follow-up   HPI  Discussed the use of AI scribe software for clinical note transcription with the patient, who gave verbal consent to proceed.  History of Present Illness   Barbara Santos is an 88 year old female who presents for a hospital follow-up after a fall and subsequent rehabilitation.  Here with son      HOSPITAL FOLLOW-UP VISIT  Hospital/Location: [facility] Date of Admission: 03/18/24 Date of Discharge: 03/24/24 discharged to SNF [facility] Transitions of care telephone call: 04/07/24 completed by Julian Lemmings LPN  Reason for Admission: Hyponatremia, weakness  - Hospital H&P and Discharge Summary have been reviewed - Patient presents today 17 days after recent hospitalization.   Fall and hospitalization - Hospitalized on November 25th following a fall attributed to muscle weakness and hyponatremia - Underwent rehabilitation after hospitalization  Hyponatremia - Initial sodium level was 125 during hospitalization - Treated with fluid restriction and sodium supplementation 1g BID - Sodium improved to 133 by recent ER visit  Peripheral edema - Swelling in feet and ankles, mild, trying to elevate - Possible related to sodium  Blood pressure and cardiovascular symptoms - Blood pressure has been stable - No dizziness, lightheadedness, or shortness of breath  Diabetes mellitus Type 2, with complications Hypoglycemia, Hyponatremia Since discharge from Hospital / SNF - Blood sugar managed with insulin  using a sliding scale. Previuosly on Metformin  only. A1c previous 7 range - Blood glucose checked three times daily before meals - Recent blood sugar levels have been stable, with a recent reading of 150 - Carries an emergency glucagon pen for  hypoglycemia  Chronic obstructive pulmonary disease and emphysema - History of COPD and emphysema - Identified on CT with Pulm Nodules - Lived with a heavy smoker for over fifty years - No current respiratory symptoms  - Today reports overall has done well after discharge. Symptoms of weakness have resolved  - New medications on discharge: Insulin   - Changes to current meds on discharge: DC Metformin   I have reviewed the discharge medication list, and have reconciled the current and discharge medications today.  Current Medications[1]  ------------------------------------------------------------------------- Social History[2]  Review of Systems Per HPI unless specifically indicated above     Objective:    BP 136/78 (BP Location: Left Arm, Cuff Size: Normal)   Pulse 83   Ht 5' 1 (1.549 m)   Wt 95 lb (43.1 kg)   SpO2 99%   BMI 17.95 kg/m   Wt Readings from Last 3 Encounters:  04/10/24 95 lb (43.1 kg)  04/09/24 90 lb (40.8 kg)  03/24/24 92 lb 6 oz (41.9 kg)    Physical Exam Vitals and nursing note reviewed.  Constitutional:      General: She is not in acute distress.    Appearance: Normal appearance. She is well-developed. She is not diaphoretic.     Comments: Well-appearing thin elderly 88 yr female, comfortable, cooperative  HENT:     Head: Normocephalic and atraumatic.  Eyes:     General:        Right eye: No discharge.        Left eye: No discharge.     Conjunctiva/sclera: Conjunctivae normal.  Neck:     Thyroid : No thyromegaly.  Cardiovascular:     Rate and Rhythm: Normal rate and  regular rhythm.     Heart sounds: Normal heart sounds. No murmur heard. Pulmonary:     Effort: Pulmonary effort is normal. No respiratory distress.     Breath sounds: Normal breath sounds. No wheezing or rales.  Musculoskeletal:        General: Normal range of motion.     Cervical back: Normal range of motion and neck supple.     Right lower leg: Edema (trace to +1  feet/ankles) present.     Left lower leg: Edema present.  Lymphadenopathy:     Cervical: No cervical adenopathy.  Skin:    General: Skin is warm and dry.     Findings: No erythema or rash.  Neurological:     Mental Status: She is alert and oriented to person, place, and time.  Psychiatric:        Mood and Affect: Mood normal.        Behavior: Behavior normal.        Thought Content: Thought content normal.     Comments: Well groomed, good eye contact, normal speech and thoughts     I have personally reviewed the radiology report from 03/20/24 on CT Chest Abdomen Pelvis.  EXAM: CT CHEST, ABDOMEN AND PELVIS WITH CONTRAST 03/20/2024 06:07:06 PM   TECHNIQUE: CT of the chest, abdomen and pelvis was performed with the administration of 80 mL of iohexol  (OMNIPAQUE ) 300 MG/ML solution. Multiplanar reformatted images are provided for review. Automated exposure control, iterative reconstruction, and/or weight based adjustment of the mA/kV was utilized to reduce the radiation dose to as low as reasonably achievable.   COMPARISON: Ultrasound pelvis 05/12/2014.   CLINICAL HISTORY: Metastatic disease evaluation.   FINDINGS:   CHEST: MEDIASTINUM AND LYMPH NODES: Heart and pericardium are unremarkable. The central airways are clear. No mediastinal, hilar or axillary lymphadenopathy.   LUNGS AND PLEURA: Scarring in the lung apices. Mild emphysematous changes in the lungs with scattered interstitial fibrosis. Scattered pulmonary nodules are present, most prominent in the right apex, image 21, measuring 6 mm in diameter and in the right middle lung, image 70, measuring 2.5 mm diameter. No pleural effusion or pneumothorax.   ABDOMEN AND PELVIS:   LIVER: Diffuse fatty infiltration of the liver. Subcentimeter low attenuation lesion in the lateral segment of the left lobe is too small to characterize but likely represents a small cyst or hemangioma.   GALLBLADDER AND BILE  DUCTS: Cholelithiasis with small stones in the gallbladder. No wall thickening or inflammatory stranding. No biliary ductal dilatation.   SPLEEN: No acute abnormality.   PANCREAS: No acute abnormality.   ADRENAL GLANDS: No acute abnormality.   KIDNEYS, URETERS AND BLADDER: 6 cm simple appearing cyst in the lower pole of the right kidney. No imaging follow-up is indicated. No stones in the kidneys or ureters. No hydronephrosis. No perinephric or periureteral stranding. Urinary bladder is unremarkable.   GI AND BOWEL: Stool-filled colon. The colon is not abnormally distended. No wall thickening or inflammatory stranding is identified. Stomach and small bowel demonstrate no acute abnormality. There is no bowel obstruction.   REPRODUCTIVE ORGANS: No acute abnormality.   PERITONEUM AND RETROPERITONEUM: No ascites. No free air. No mesenteric mass or collection is seen.   VASCULATURE: Calcification of the aorta. No aneurysm or dissection.   ABDOMINAL AND PELVIS LYMPH NODES: No lymphadenopathy.   BONES AND SOFT TISSUES: Degenerative changes in the spine and hips. Old left rib fractures. No focal bone lesions. No focal soft tissue abnormality.   IMPRESSION: 1.  No evidence of metastatic disease. 2. Scattered pulmonary nodules measuring up to 6 mm in the right apex and 2.5 mm in the right middle lobe. Given oncologic indication and incomplete applicability of Fleischner guidelines, recommend non-contrast chest CT in 612 months to assess stability, with consideration of PET/CT or tissue sampling if interval growth or suspicious features develop. 3. Mild emphysema, which is an independent lung cancer risk factor. For patients aged 86, consider evaluation for a low-dose CT lung cancer screening program. 4. Cholelithiasis without acute cholecystitis.   Electronically signed by: Elsie Gravely MD 03/20/2024 06:25 PM EST RP Workstation: [id number]  Results for orders placed  or performed during the hospital encounter of 04/09/24  CBG monitoring, ED   Collection Time: 04/09/24  4:52 PM  Result Value Ref Range   Glucose-Capillary 107 (H) 70 - 99 mg/dL  CBC with Differential   Collection Time: 04/09/24  5:06 PM  Result Value Ref Range   WBC 6.4 4.0 - 10.5 K/uL   RBC 3.37 (L) 3.87 - 5.11 MIL/uL   Hemoglobin 10.8 (L) 12.0 - 15.0 g/dL   HCT 68.5 (L) 63.9 - 53.9 %   MCV 93.2 80.0 - 100.0 fL   MCH 32.0 26.0 - 34.0 pg   MCHC 34.4 30.0 - 36.0 g/dL   RDW 85.6 88.4 - 84.4 %   Platelets 257 150 - 400 K/uL   nRBC 0.0 0.0 - 0.2 %   Neutrophils Relative % 69 %   Neutro Abs 4.5 1.7 - 7.7 K/uL   Lymphocytes Relative 16 %   Lymphs Abs 1.0 0.7 - 4.0 K/uL   Monocytes Relative 12 %   Monocytes Absolute 0.8 0.1 - 1.0 K/uL   Eosinophils Relative 1 %   Eosinophils Absolute 0.1 0.0 - 0.5 K/uL   Basophils Relative 1 %   Basophils Absolute 0.0 0.0 - 0.1 K/uL   Immature Granulocytes 1 %   Abs Immature Granulocytes 0.03 0.00 - 0.07 K/uL  Basic metabolic panel   Collection Time: 04/09/24  5:06 PM  Result Value Ref Range   Sodium 133 (L) 135 - 145 mmol/L   Potassium 4.5 3.5 - 5.1 mmol/L   Chloride 97 (L) 98 - 111 mmol/L   CO2 26 22 - 32 mmol/L   Glucose, Bld 155 (H) 70 - 99 mg/dL   BUN 19 8 - 23 mg/dL   Creatinine, Ser 9.53 0.44 - 1.00 mg/dL   Calcium  9.4 8.9 - 10.3 mg/dL   GFR, Estimated >39 >39 mL/min   Anion gap 10 5 - 15  Pro Brain natriuretic peptide   Collection Time: 04/09/24  5:06 PM  Result Value Ref Range   Pro Brain Natriuretic Peptide 255.0 <300.0 pg/mL  Troponin T, High Sensitivity   Collection Time: 04/09/24  5:06 PM  Result Value Ref Range   Troponin T High Sensitivity <15 0 - 19 ng/L  Urinalysis, Routine w reflex microscopic -Urine, Clean Catch   Collection Time: 04/09/24 10:00 PM  Result Value Ref Range   Color, Urine STRAW (A) YELLOW   APPearance CLEAR (A) CLEAR   Specific Gravity, Urine 1.008 1.005 - 1.030   pH 7.0 5.0 - 8.0   Glucose, UA  >=500 (A) NEGATIVE mg/dL   Hgb urine dipstick NEGATIVE NEGATIVE   Bilirubin Urine NEGATIVE NEGATIVE   Ketones, ur 20 (A) NEGATIVE mg/dL   Protein, ur NEGATIVE NEGATIVE mg/dL   Nitrite NEGATIVE NEGATIVE   Leukocytes,Ua NEGATIVE NEGATIVE   RBC / HPF 0-5 0 - 5 RBC/hpf  WBC, UA 6-10 0 - 5 WBC/hpf   Bacteria, UA NONE SEEN NONE SEEN   Squamous Epithelial / HPF 0 0 - 5 /HPF  POC CBG, ED   Collection Time: 04/09/24 10:36 PM  Result Value Ref Range   Glucose-Capillary 151 (H) 70 - 99 mg/dL      Assessment & Plan:   Problem List Items Addressed This Visit     Centrilobular emphysema (HCC)   Hyponatremia   SIADH (syndrome of inappropriate ADH production)   Type II diabetes mellitus (HCC) - Primary   Relevant Medications   insulin  aspart (NOVOLOG  FLEXPEN) 100 UNIT/ML FlexPen   Other Visit Diagnoses       Generalized muscle weakness         Pulmonary nodules            Type 2 diabetes mellitus with complication - Hyponatremia, Hypoglycemia  Recent course with Hyponatremia causing weakness recently, hospitalized and then completed SNF Rehab at [facility], recently discharged home. Prior to hospital stay she was on Metformin  only oral glucose management and Glucometer.  During hospital / SNF stay she was managed on insulin  regimen with basal + bolus  Currently working with [facility] pharmacy team to evaluate possibility of DPP4 or other oral therapy options that she may be eligible for that is safe and affordable.  Now since hospital / SNF discharge, there have been some issues with insulin  regimen. One episode of Hypoglycemia prompted ED visit.  She has been using meal time SSI 3 x per day with Novolog  pen, ranging from 0 to 15 units per meal, average < 10 units based off of her CBG readings. They have NOT used the Basal glargine 10 unit daily dose at all yet. Also, running very low on Novolog  needing new order today  She remains OFF Metformin .  She is interested in CGM but unsure  about how to use it and asks about the new glucometer that is covered under her insurance for 2026.  - Ordered Novolog  pen for short-acting insulin . Reviewed SSI regimen given by hospital/SNF. I adjusted it slightly to max 10 units Novolog  for CBG 350+ and to contact PCP if consistently >400. - Encourage to start the basal glargine 10 unit daily, they have this at home - She has Glucagon pen emergency hypoglycemia kit if need. I advised we could upgrade to GVOKE pen for auto injector, but may be higher cost. Could check into this option. - I will collaborate with Sharyle Sia [facility] CPP through [facility] pharmacy to coordinate a plan for her insulin  and DM management going forward. My concerns today that I shared with patient and her son are the following      - I am worried about future Hypoglycemia episodes given meal time insulin  regimen with sliding scale, leaves room for errors or issues with meal admin / dosing. Also 2 different types of insulin  can cause confusion given they have Novolog  + Glargine but have not used glargine yet      - She would benefit from CGM but some logistical issues with monitoring it, may be more difficult for her actually      - Given her A1c is relatively well controlled in 7 range, at her age 62, she is eligible for DM goal of < 8.0%, I am hopeful that we could resume oral therapy if we can get additional coverage for a more advanced med than Metformin . If we can get her on Januvia or other option, and avoid mealtime  insulin , we can hopefully manage her without as much risk for hypoglycemia.  Hyponatremia Recent hospitalization for hyponatremia, likely SIADH-related. Sodium improved to 133 mEq/L. Consider endocrinology referral if unstable. Improving now, last lab Na 133 - Continue sodium tablets as prescribed. 1g TWICE A DAY - they have plenty, I advised that it is too soon to discontinue this and we can continue sodium supplement as we are getting her sugar back under  control. - Monitor sodium levels and adjust treatment as needed. - Repeat lab in 4 weeks and follow-up - Consider endocrinology referral if sodium levels remain unstable.  Lower extremity edema Likely related to sodium supplementation. Discussed potential complications of excessive sodium intake. - Monitor for changes in edema. - Use compression stockings and elevate legs in the evening.  Pulmonary nodules Incidental pulmonary nodules, no immediate concern due to lack of smoking history but significant secondhand smoke exposure. - Schedule lung scan during next mammogram to monitor pulmonary nodules.  Renal cyst Incidental renal cyst likely congenital, not affecting kidney function.  Centrilobular Emphysema / Chronic obstructive pulmonary disease (COPD) Pulmonary Nodules Identified on CT imaging COPD with no current symptoms. Discussed secondhand smoke exposure as a contributing factor. - Continue current management and monitor for respiratory symptoms.  - Consider further maintenance therapy as indicated for symptoms - Offer Pulm Nodule referral / CT follow-up or LDCT in 6-12 months     Keep call with Sharyle Sia [facility] CPP tomorrow 04/11/24. I will route my notes and concerns to her.   Notes - Current SSI for Novolog  Mealtime  CBG < 150 0 units CBG 151-200 2 units CBG 201-250 3 units CBG 251-300 5 units CBG 301-350 8 units CBG 351-400 11 units  CBG 401-450+ 15 units   Meds ordered this encounter  Medications   insulin  aspart (NOVOLOG  FLEXPEN) 100 UNIT/ML FlexPen    Sig: Inject up to max 10 units 3 times a day with meals following sliding scale insulin . CBG 70 - 120: 0 units CBG 121 - 150: 2 units CBG 151 - 200: 3 units CBG 201 - 250: 5 units CBG 251 - 300: 8 units CBG 301 - 350: 10 units. Call for higher sugar.    Dispense:  15 mL    Refill:  0    Follow up plan: Return in about 4 weeks (around 05/08/2024) for 4 week non fasting lab then 1 week later Follow-up  Diabetes, Hyponatremia.  Return 1 month  Marsa Officer, DO [facility] 04/10/2024, 10:36 AM     [1]  Current Outpatient Medications:    bisacodyl  (DULCOLAX) 10 MG suppository, Place 1 suppository (10 mg total) rectally daily as needed for moderate constipation., Disp: , Rfl:    Cholecalciferol  (VITAMIN D) 2000 UNITS tablet, Take 2,000 Units by mouth daily., Disp: , Rfl:    dorzolamide -timolol  (COSOPT ) 22.3-6.8 MG/ML ophthalmic solution, Place 1 drop into both eyes 2 (two) times daily. , Disp: , Rfl: 0   insulin  aspart (NOVOLOG  FLEXPEN) 100 UNIT/ML FlexPen, Inject up to max 10 units 3 times a day with meals following sliding scale insulin . CBG 70 - 120: 0 units CBG 121 - 150: 2 units CBG 151 - 200: 3 units CBG 201 - 250: 5 units CBG 251 - 300: 8 units CBG 301 - 350: 10 units. Call for higher sugar., Disp: 15 mL, Rfl: 0   insulin  aspart (NOVOLOG ) 100 UNIT/ML injection, CBG 70 - 120: 0 units CBG 121 - 150: 2 units  CBG 151 - 200: 3 units CBG 201 - 250: 5 units CBG 251 - 300: 8 units CBG 301 - 350: 11 units CBG 351 - 400: 15 units CBG > 400: call MD and obtain verification, Disp: , Rfl:    insulin  glargine-yfgn (SEMGLEE ) 100 UNIT/ML injection, Inject 0.1 mLs (10 Units total) into the skin daily at 10 pm., Disp: , Rfl:    lactose free nutrition (BOOST) LIQD, Take 237 mLs by mouth 3 (three) times daily between meals., Disp: , Rfl:    levothyroxine  (SYNTHROID ) 50 MCG tablet, Take 1 tablet (50 mcg total) by mouth daily before breakfast., Disp: 90 tablet, Rfl: 3   magnesium  chloride (SLOW-MAG) 64 MG TBEC SR tablet, Take 1 tablet (64 mg total) by mouth daily., Disp: , Rfl:    Multiple Vitamin (MULTIVITAMIN) tablet, Take 1 tablet by mouth daily., Disp: , Rfl:    polyethylene glycol powder (GLYCOLAX /MIRALAX ) 17 GM/SCOOP powder, Take 17-34 g by mouth daily as needed for moderate constipation. May increase up to 3 to 4 capfuls if needed short term for  constipation., Disp: 238 g, Rfl: 3   primidone  (MYSOLINE ) 50 MG tablet, Take 0.5-1 tablets (25-50 mg total) by mouth at bedtime. For tremors, Disp: 30 tablet, Rfl: 0   rosuvastatin  (CRESTOR ) 5 MG tablet, Take 1 tablet (5 mg total) by mouth at bedtime., Disp: 90 tablet, Rfl: 3   sodium chloride  1 g tablet, Take 1 tablet (1 g total) by mouth 2 (two) times daily with a meal., Disp: , Rfl:    thiamine  (VITAMIN B1) 100 MG tablet, Take 1 tablet (100 mg total) by mouth daily., Disp: , Rfl:    traZODone  (DESYREL ) 50 MG tablet, Take 1-2 tablets (50-100 mg total) by mouth at bedtime as needed for sleep., Disp: , Rfl:    valsartan  (DIOVAN ) 40 MG tablet, Take 1.5 tablets (60 mg total) by mouth daily., Disp: 135 tablet, Rfl: 3 [2]  Social History Tobacco Use   Smoking status: Never   Smokeless tobacco: Never  Vaping Use   Vaping status: Never Used  Substance Use Topics   Alcohol use: No    Alcohol/week: 0.0 standard drinks of alcohol   Drug use: No

## 2024-04-10 NOTE — Telephone Encounter (Signed)
 FYI Only or Action Required?: Action required by provider: update on patient condition.  Patient was last seen in primary care on 04/10/2024 by Barbara Marsa PARAS, DO.  Called Nurse Triage reporting Hyperglycemia.  Symptoms began yesterday.  Interventions attempted: Prescription medications: insulin  .  Symptoms are: gradually worsening.  Triage Disposition: Call PCP Now  Patient/caregiver understands and will follow disposition?: Yes  Copied from CRM #[id number]. Topic: Clinical - Red Word Triage >> Apr 10, 2024  4:42 PM Winona R wrote: Sugar Level 441, no symptoms  Just finish eating Peanut butter crackers small bottle of caffeine free  mountain dew Reason for Disposition  Blood glucose > 400 mg/dL (77.7 mmol/L)  Answer Assessment - Initial Assessment Questions Pt seen in the ED 12/17 for hypoglycemia, and 12/18 for HFU. It was felt that she did more activity than normal and did not eat as much snacks so her glucose dropped.  Glucose prior to OV 161, at lunch 261 and was advised to take the 10unit single dose they had from rehab. She just finished a snack of peanut butter crackers and diet mt dew and BGL 451. Insulin  script advises to reach out to MD. Pt denies dizziness, nausea, vomiting, confusion, lethargy. She is Urinating frequently but denies excessive thirst or hunger. She overall feels good. 03/24/24 prescription for 15units at current BGL. Will take that now. Script sent today only goes up to BGL 350 sliding scale 10units. Family staying with patient tonight and will continue to monitor her symptoms and glucose levels. CAL attempted with no answer prior to close. Please reach out with additional recommendations. Return to ED precautions understood.   1. BLOOD GLUCOSE: What is your blood glucose level?      451 2. ONSET: When did you check the blood glucose?     today 3. USUAL RANGE: What is your glucose level usually? (e.g., usual fasting morning value, usual  evening value)     Was low yesterday- seen in office today 4. KETONES: Do you check for ketones (urine or blood test strips)? If Yes, ask: What does the test show now?      unsure 5. TYPE 1 or 2:  Do you know what type of diabetes you have?  (e.g., Type 1, Type 2, Gestational; doesn't know)      Type 2 6. INSULIN : Do you take insulin ? What type of insulin (s) do you use? What is the mode of delivery? (syringe, pen; injection or pump)?      10units at lunchtime  7. DIABETES PILLS: Do you take any pills for your diabetes? If Yes, ask: Have you missed taking any pills recently?     Not prescribed 8. OTHER SYMPTOMS: Do you have any symptoms? (e.g., fever, frequent urination, difficulty breathing, dizziness, weakness, vomiting)     Frequent urination  Protocols used: Diabetes - High Blood Sugar-A-AH

## 2024-04-10 NOTE — Telephone Encounter (Signed)
 I am aware of this concern and I will bring it up with Sharyle as she has a phone call with the patient on Friday 12/19 at 9am.   No further action required at this time.  -------------  My comments below  She was not having this level of high sugar when I saw her earlier today. She reported the highest sugar was a >300 once, otherwise it has been better.  I did find out that it sounds like she is not using the Long Acting - Glargine Insulin  10 unit once per day (the gray pen) that they did not have with them at the visit.  If she is using the meal time pen 3 times a day and then also the daily pen then her sugars should be back on track.  Marsa Officer, DO [facility] 04/10/2024, 6:05 PM

## 2024-04-10 NOTE — Patient Instructions (Addendum)
 Thank you for coming to the office today.  GVOKE - look into pricing options or coverage, we can order this upgraded emergency pen if interested  Ideally we will organize the two insulins  One is the Novolog  / Aspart - quick acting meal time - orange label today - NEW refill today  The other pen is the daily insulin  glargine, 10 units once per day - you can SKIP THIS FOR NOW until we make a decision. IF you run out of the short acting insulin  with meals, it is okay to use ONE DOSE PER DAY MAX long acting 10 units  we can check if you need additional orders on either  We will also look into the finger prick glucometer to find the preferred version for next year or consider the sensor.  Remain OFF Metformin  while you are on the insulin . We may revisit this in the future.  Continue the sodium tablets as you are for today  The last lab was almost back to normal Na 133  We will check it again in a month.   Please schedule a Follow-up Appointment to: Return in about 4 weeks (around 05/08/2024) for 4 week non fasting lab then 1 week later Follow-up Diabetes, Hyponatremia.  If you have any other questions or concerns, please feel free to call the office or send a message through MyChart. You may also schedule an earlier appointment if necessary.  Additionally, you may be receiving a survey about your experience at our office within a few days to 1 week by e-mail or mail. We value your feedback.  Marsa Officer, DO [facility], [place]

## 2024-04-11 ENCOUNTER — Telehealth: Payer: Self-pay

## 2024-04-11 ENCOUNTER — Other Ambulatory Visit (HOSPITAL_COMMUNITY): Payer: Self-pay

## 2024-04-11 ENCOUNTER — Other Ambulatory Visit: Payer: Self-pay | Admitting: Family Medicine

## 2024-04-11 ENCOUNTER — Other Ambulatory Visit

## 2024-04-11 DIAGNOSIS — E1169 Type 2 diabetes mellitus with other specified complication: Secondary | ICD-10-CM

## 2024-04-11 DIAGNOSIS — Z794 Long term (current) use of insulin: Secondary | ICD-10-CM

## 2024-04-11 MED ORDER — METFORMIN HCL ER 500 MG PO TB24: 500.0000 mg | ORAL_TABLET | Freq: Every day | ORAL | 1 refills | Status: DC

## 2024-04-11 NOTE — Progress Notes (Signed)
 "  04/11/2024 Name: Barbara Santos MRN: [id number] DOB: 12/28/1935  Chief Complaint  Patient presents with   Medication Management   Medication Assistance    Barbara Santos is Santos 88 y.o. year old female who presented for Santos telephone visit.   They were referred to the pharmacist by their PCP for assistance in managing diabetes and medication access.    Today speak with both patient and son/caregiver, Barbara Santos, as requested    Subjective:   Care Team: Primary Care Provider: Edman Marsa PARAS, DO ; Next Scheduled Visit: 05/15/2024 at 9:40 AM     Medication Access/Adherence  Current Pharmacy:  [id number] [place], [place] - [address] [place] [address] Phone: [phone] Fax: [phone]  [facility] - [place], [place] - One [facility] AT Portal to Registered [address] [place] [address] Phone: [phone] Fax: [phone]  [id number] [place], [place] - [address] [place] [address] Phone: [phone] Fax: [phone]   Patient reports affordability concerns with their medications: Yes  Patient reports access/transportation concerns to their pharmacy: No  Patient reports adherence concerns with their medications:  No    Using weekly pillbox as adherence aid   Today reports that she has not yet received Santos letter in the mail from Social Security regarding Extra Help subsidy application submitted on 02/01/2024   Diabetes:   Current medications:  - Lantus  Solostar 10 units once daily - confirms started yesterday at lunch as directed by PCP - Novolog  Flexpen - taking per sliding scale: Injects up to max 10 units 3 times Santos day with meals following sliding scale insulin . CBG 70 - 120: 0 units CBG 121 - 150: 2 units CBG 151 - 200: 3 units CBG 201 - 250: 5 units CBG 251 - 300: 8 units CBG 301 - 350: 10 units. Call for higher sugar.    Previous therapies tried: metformin  ER (stopped while in the hospital in November due to concern that it may have been impacting patient's appetite since increased to latest dose (metformin  ER 1000 mg twice daily) in September   Patient uses One Touch Verio meter  Reports home blood sugar readings: - Yesterday Before supper: 461 (reports took 11 units of Novolog  last night with supper) Post supper reading last night: 364 - This morning before breakfast: 361  Patient denies hypoglycemic s/sx including dizziness, shakiness, sweating.  - Note patient carries glucose tablets with her in case of hypoglycemia and glucagon pen in case needed for severe lows  Current dietary habits: - Breakfast: apple sauce, scrambled eggs, 1 biscuit, sugar-free jelly and orange juice - Lunch: Fried fish sandwich + chips + diet Mountain Dew and blue berry mini muffin - Supper: Green bean (green beans, carrots, chicken and 1/4 cup pasta) casserole, BBQ and fish - Drinks: water  - Snacks: peanut butter crackers and low-sugar Ensure     Objective:  Lab Results  Component Value Date   HGBA1C 8.0 (H) 03/20/2024    Lab Results  Component Value Date   CREATININE 0.46 04/09/2024   BUN 19 04/09/2024   NA 133 (L) 04/09/2024   K 4.5 04/09/2024   CL 97 (L) 04/09/2024   CO2 26 04/09/2024    Lab Results  Component Value Date   CHOL 138 03/30/2023   HDL 71 03/30/2023   LDLCALC 53 03/30/2023   TRIG 61 03/30/2023   CHOLHDL  1.9 03/30/2023    Medications Reviewed Today     Reviewed by Barbara Santos, RPH-CPP (Pharmacist) on 04/11/24 at [phone]  Med List Status: <None>   Medication Order Taking? Sig Documenting Provider Last Dose Status Informant  bisacodyl  (DULCOLAX) 10 MG suppository [id number] Yes Place 1 suppository (10 mg total) rectally daily as needed for moderate constipation. Santos, Natalie, DO  Active   Cholecalciferol  (VITAMIN D) 2000 UNITS tablet [id number] Yes Take 2,000 Units by mouth  daily. [provider]  Active Self  dorzolamide -timolol  (COSOPT ) 22.3-6.8 MG/ML ophthalmic solution [id number] Yes Place 1 drop into both eyes 2 (two) times daily.  [provider]  Active Self           Med Note Barbara Santos   Thu Oct 21, 2015  8:48 AM) Received from: External Pharmacy Received Sig: INSTILL ONE DROP IN OU BID  insulin  aspart (NOVOLOG  FLEXPEN) 100 UNIT/ML FlexPen [id number] Yes Inject up to max 10 units 3 times Santos day with meals following sliding scale insulin . CBG 70 - 120: 0 units CBG 121 - 150: 2 units CBG 151 - 200: 3 units CBG 201 - 250: 5 units CBG 251 - 300: 8 units CBG 301 - 350: 10 units. Call for higher sugar. Barbara Marsa PARAS, DO  Active     Discontinued 04/11/24 [phone] (Duplicate)   insulin  glargine-yfgn (SEMGLEE ) 100 UNIT/ML injection [id number] Yes Inject 0.1 mLs (10 Units total) into the skin daily at 10 pm. Marsa Edelman, DO  Active   lactose free nutrition (BOOST) LIQD [id number] Yes Take 237 mLs by mouth 3 (three) times daily between meals. [provider]  Active Self           Med Note (Barbara Santos   Tue Oct 24, 2016 10:06 AM) Drinks 1 Santos day  levothyroxine  (SYNTHROID ) 50 MCG tablet [id number] Yes Take 1 tablet (50 mcg total) by mouth daily before breakfast. Barbara Marsa PARAS, DO  Active Self  magnesium  chloride (SLOW-MAG) 64 MG TBEC SR tablet [id number] Yes Take 1 tablet (64 mg total) by mouth daily. Santos, Natalie, DO  Active   Multiple Vitamin (MULTIVITAMIN) tablet [id number] Yes Take 1 tablet by mouth daily. [provider]  Active Self  polyethylene glycol powder (GLYCOLAX /MIRALAX ) 17 GM/SCOOP powder [id number] Yes Take 17-34 g by mouth daily as needed for moderate constipation. May increase up to 3 to 4 capfuls if needed short term for constipation. Barbara Marsa PARAS, DO  Active Self  primidone  (MYSOLINE ) 50 MG tablet [id number] Yes Take 0.5-1 tablets (25-50 mg total) by mouth at bedtime. For  tremors Barbara Marsa PARAS, DO  Active Self  rosuvastatin  (CRESTOR ) 5 MG tablet [id number] Yes Take 1 tablet (5 mg total) by mouth at bedtime. Barbara Marsa PARAS, DO  Active Self  sodium chloride  1 g tablet [id number] Yes Take 1 tablet (1 g total) by mouth 2 (two) times daily with Santos meal. Marsa Edelman, DO  Active   thiamine  (VITAMIN B1) 100 MG tablet [id number] Yes Take 1 tablet (100 mg total) by mouth daily. Santos, Natalie, DO  Active   traZODone  (DESYREL ) 50 MG tablet [id number] Yes Take 1-2 tablets (50-100 mg total) by mouth at bedtime as needed for sleep. Santos, Natalie, DO  Active   valsartan  (DIOVAN ) 40 MG tablet [id number] Yes Take 1.5 tablets (60 mg total) by mouth daily. Barbara Marsa PARAS, DO  Active Self              Assessment/Plan:   Today  provide patient/son with contact information to follow up with Social Security regarding status of Extra Help subsidy application - Receive call back from son who advises that per [employer], confirms that application was received 02/01/2024, but is still in processing  Diabetes: - Have reviewed long term cardiovascular and renal outcomes of uncontrolled blood sugar. - Review dietary modifications including importance of having regular well-balanced meals and snacks throughout the day, while controlling carbohydrate portion sizes  Encourage to avoid consumption of sugary beverages  Advise against skipping meals - Review Novolog  direction per latest sliding scale from PCP. Remind patient that Novolog  should not be used if pre-meal blood sugar is less than 121 or if she is not eating Santos full meal. If needed based on pre-meal blood sugar, to be used only according to sliding scale. Review to administer Novolog  dose within 5 to 10 minutes before the meal - Review storage direction for Novolog  Flexpen and Lantus  Solostar pen, including that each pen should be discarded 28 days after opening. Unopened pens should be  stored in the refrigerator.  - Discuss with patient, son and PCP option to restart metformin  ER at low dose and monitor for any side effects, including for any impact on appetite. Patient/son and provider agree  PCP sends prescription for metformin  ER 500 mg daily with breakfast to pharmacy for patient - Counsel patient on s/s of low blood sugar and how to treat lows Review rules of 15s - importance of using 15 grams of sugar to treat low, recheck blood sugar in 15 minutes, treat again if remains low or, if back to normal, having meal if mealtime or snack  Review examples of sources of 15 grams of sugar Encourage patient to pick up new supply of glucose tablets (as current supply may be expired) - Patient denies interest in using continuous glucose monitoring (CGM) at this time - Recommend to continue to monitor home blood sugar, keep log of results and have this record to review at upcoming medical appointments. Patient to contact provider office sooner if needed for readings outside of established parameters or symptoms     Follow Up Plan:    Clinical Pharmacist will follow up with patient by telephone on 04/18/2024 at 9:00 AM    Patient/son agree to contact Clinical Pharmacist if receives response from [employer] in the mail sooner     Sharyle Sia, PharmD, [place], CPP Clinical Pharmacist [facility] [phone]   "

## 2024-04-11 NOTE — Telephone Encounter (Unsigned)
 Copied from CRM #[id number]. Topic: Clinical - Medication Question >> Apr 11, 2024 11:54 AM Avram MATSU wrote: Reason for CRM: son is calling for patient and stated she has an extra rx of metFORMIN  and would like to know what to do with the extra pills, please advise [phone]

## 2024-04-11 NOTE — Patient Instructions (Signed)
 Goals Addressed             This Visit's Progress    Pharmacy Goals       The goal A1c is less than 7%. This is the best way to reduce the risk of the long term complications of diabetes, including heart disease, kidney disease, eye disease, strokes, and nerve damage. An A1c of less than 7% corresponds with fasting sugars less than 130 and 2 hour after meal sugars less than 180.   Our goal bad cholesterol, or LDL, is less than 70 . This is why it is important to continue taking your rosuvastatin .  Check your blood pressure once weekly, and any time you have concerning symptoms like headache, chest pain, dizziness, shortness of breath, or vision changes.   To appropriately check your blood pressure, make sure you do the following:  1) Avoid caffeine, exercise, or tobacco products for 30 minutes before checking. Empty your bladder. 2) Sit with your back supported in a flat-backed chair. Rest your arm on something flat (arm of the chair, table, etc). 3) Sit still with your feet flat on the floor, resting, for at least 5 minutes.  4) Check your blood pressure. Take 1-2 readings.  5) Write down these readings and bring with you to any provider appointments.  Bring your home blood pressure machine with you to a provider's office for accuracy comparison at least once a year.   Make sure you take your blood pressure medications before you come to any office visit, even if you were asked to fast for labs.  Sharyle Sia, PharmD, [facility] [phone]

## 2024-04-14 ENCOUNTER — Telehealth: Payer: Self-pay

## 2024-04-14 NOTE — Telephone Encounter (Signed)
 Copied from CRM #[id number]. Topic: Clinical - Home Health Verbal Orders >> Apr 14, 2024 10:12 AM Zebedee SAUNDERS wrote: Caller/Agency: [facility] per Holley Cleaves Callback Number: [phone] Service Requested: Skilled Nursing Frequency: Plan of Care on Dec. 20th, 2025. Any new concerns about the patient? No

## 2024-04-14 NOTE — Telephone Encounter (Signed)
 Okay for verbal order  Marsa Officer, DO [facility] 04/14/2024, 11:37 AM

## 2024-04-14 NOTE — Telephone Encounter (Signed)
 Left message for patient to return call OK  to advise  verbal orders. Unclear if voicemail was secure

## 2024-04-15 ENCOUNTER — Telehealth: Payer: Self-pay

## 2024-04-15 NOTE — Telephone Encounter (Signed)
 Okay to proceed w/ verbal orders  Marsa Officer, DO [facility] 04/15/2024, 12:13 PM

## 2024-04-15 NOTE — Telephone Encounter (Signed)
 Copied from CRM #[id number]. Topic: Clinical - Home Health Verbal Orders >> Apr 15, 2024 11:12 AM Rosaria BRAVO wrote: Caller/Agency: Annabella Gander PT [facility]  Callback Number: [phone] Service Requested: Physical Therapy Frequency:  1w6  Any new concerns about the patient? No

## 2024-04-15 NOTE — Telephone Encounter (Signed)
 Tiffany advised. No further action.

## 2024-04-18 ENCOUNTER — Other Ambulatory Visit: Admitting: Pharmacist

## 2024-04-18 DIAGNOSIS — Z794 Long term (current) use of insulin: Secondary | ICD-10-CM

## 2024-04-18 DIAGNOSIS — E1169 Type 2 diabetes mellitus with other specified complication: Secondary | ICD-10-CM

## 2024-04-18 MED ORDER — ONETOUCH VERIO VI STRP: ORAL_STRIP | 3 refills | Status: DC

## 2024-04-18 MED ORDER — PEN NEEDLES 31G X 5 MM MISC: 1 refills | Status: AC

## 2024-04-18 NOTE — Patient Instructions (Signed)
 Goals Addressed             This Visit's Progress    Pharmacy Goals       The goal A1c is less than 7%. This is the best way to reduce the risk of the long term complications of diabetes, including heart disease, kidney disease, eye disease, strokes, and nerve damage. An A1c of less than 7% corresponds with fasting sugars less than 130 and 2 hour after meal sugars less than 180.   Our goal bad cholesterol, or LDL, is less than 70 . This is why it is important to continue taking your rosuvastatin .  Check your blood pressure once weekly, and any time you have concerning symptoms like headache, chest pain, dizziness, shortness of breath, or vision changes.   To appropriately check your blood pressure, make sure you do the following:  1) Avoid caffeine, exercise, or tobacco products for 30 minutes before checking. Empty your bladder. 2) Sit with your back supported in a flat-backed chair. Rest your arm on something flat (arm of the chair, table, etc). 3) Sit still with your feet flat on the floor, resting, for at least 5 minutes.  4) Check your blood pressure. Take 1-2 readings.  5) Write down these readings and bring with you to any provider appointments.  Bring your home blood pressure machine with you to a provider's office for accuracy comparison at least once a year.   Make sure you take your blood pressure medications before you come to any office visit, even if you were asked to fast for labs.  Sharyle Sia, PharmD, [facility] [phone]

## 2024-04-18 NOTE — Progress Notes (Signed)
 "  04/18/2024 Name: Barbara Santos MRN: [id number] DOB: 1935/07/22  Chief Complaint  Patient presents with   Medication Management    Barbara Santos is a 88 y.o. year old female who presented for a telephone visit.   They were referred to the pharmacist by their PCP for assistance in managing diabetes and medication access.    Today speak with both patient and son/caregiver, Barbara Santos, as requested     Subjective:   Care Team: Primary Care Provider: Edman Barbara PARAS, DO ; Next Scheduled Visit: 05/15/2024 at 9:40 AM   Medication Access/Adherence  Current Pharmacy:  [id number] [place], [place] - [address] [place] [address] Phone: [phone] Fax: [phone]  [facility] - [place], [place] - One [facility] AT Portal to Registered [address] [place] [address] Phone: [phone] Fax: [phone]  [id number] [place], [place] - [address] [place] [address] Phone: [phone] Fax: [phone]   Patient reports affordability concerns with their medications: Yes  Patient reports access/transportation concerns to their pharmacy: No  Patient reports adherence concerns with their medications:  No     Using weekly pillbox as adherence aid    Today report that she has not yet received a letter in the mail from Social Security regarding Extra Help subsidy application submitted on 02/01/2024   Diabetes:   Current medications:  - metformin  ER 500 mg -1 tablet daily with breakfast (restarted 1 week ago)  Reports tolerating well; denies side effects - Lantus  Solostar 10 units once daily - confirms started yesterday at lunch as directed by PCP - Novolog  Flexpen - taking per sliding scale: Injects up to max 10 units 3 times a day with meals following sliding scale insulin . CBG 70 - 120: 0 units CBG 121 - 150: 2 units CBG 151 - 200: 3 units CBG  201 - 250: 5 units CBG 251 - 300: 8 units CBG 301 - 350: 10 units. Call for higher sugar.    Previous therapies tried: metformin  ER (stopped while in the hospital in November due to concern that it may have been impacting patient's appetite since increased to latest dose (metformin  ER 1000 mg twice daily) in September   Reports using One Touch Verio meter to check blood sugar between 3-5 times daily   Denies recording most of her home blood sugar readings, but does have the following to share: - Today: before breakfast: 321 (attributes elevated reading to eating more sweets and carbohydrates yesterday at holiday gathering) - Yesterday: before breakfast: 277 - 12/21: before lunch: 167  - 12/20: before supper: 178  Reports working on controlling portion sizes of sweets. Denies missing any meals  Patient denies hypoglycemic s/sx including dizziness, shakiness, sweating.  - Note patient carries glucose tablets with her in case of hypoglycemia and glucagon pen in case needed for severe lows, but needs to obtain new supply of glucose tablets     Objective:  Lab Results  Component Value Date   HGBA1C 8.0 (H) 03/20/2024    Lab Results  Component Value Date   CREATININE 0.46 04/09/2024   BUN 19 04/09/2024   NA 133 (L) 04/09/2024   K 4.5 04/09/2024   CL 97 (L) 04/09/2024   CO2 26 04/09/2024    Lab Results  Component Value Date   CHOL 138 03/30/2023   HDL 71 03/30/2023   LDLCALC 53 03/30/2023  TRIG 61 03/30/2023   CHOLHDL 1.9 03/30/2023    Medications Reviewed Today     Reviewed by Barbara Santos LABOR, RPH-CPP (Pharmacist) on 04/18/24 at 1008  Med List Status: <None>   Medication Order Taking? Sig Documenting Provider Last Dose Status Informant  bisacodyl  (DULCOLAX) 10 MG suppository [id number]  Place 1 suppository (10 mg total) rectally daily as needed for moderate constipation. Barbara Santos, Natalie, DO  Active   Cholecalciferol  (VITAMIN D) 2000 UNITS tablet [id number]  Take  2,000 Units by mouth daily. [provider]  Active Self  dorzolamide -timolol  (COSOPT ) 22.3-6.8 MG/ML ophthalmic solution [id number]  Place 1 drop into both eyes 2 (two) times daily.  [provider]  Active Self           Med Note PECOLA, Barbara Santos   Thu Oct 21, 2015  8:48 AM) Received from: External Pharmacy Received Sig: INSTILL ONE DROP IN OU BID  insulin  aspart (NOVOLOG  FLEXPEN) 100 UNIT/ML FlexPen [id number] Yes Inject up to max 10 units 3 times a day with meals following sliding scale insulin . CBG 70 - 120: 0 units CBG 121 - 150: 2 units CBG 151 - 200: 3 units CBG 201 - 250: 5 units CBG 251 - 300: 8 units CBG 301 - 350: 10 units. Call for higher sugar. Edman Barbara PARAS, DO  Active   insulin  glargine-yfgn (SEMGLEE ) 100 UNIT/ML injection [id number] Yes Inject 0.1 mLs (10 Units total) into the skin daily at 10 pm.  Patient taking differently: Inject 10 Units into the skin daily. Taking at lunch time   Barbara Edelman, DO  Active   lactose free nutrition (BOOST) LIQD [id number]  Take 237 mLs by mouth 3 (three) times daily between meals. [provider]  Active Self           Med Note (Santos, Barbara A   Tue Oct 24, 2016 10:06 AM) Drinks 1 a day  levothyroxine  (SYNTHROID ) 50 MCG tablet [id number]  Take 1 tablet (50 mcg total) by mouth daily before breakfast. Edman Barbara PARAS, DO  Active Self  magnesium  chloride (SLOW-MAG) 64 MG TBEC SR tablet [id number]  Take 1 tablet (64 mg total) by mouth daily. Barbara Santos, Natalie, DO  Active   metFORMIN  (GLUCOPHAGE -XR) 500 MG 24 hr tablet [id number] Yes Take 1 tablet (500 mg total) by mouth daily with breakfast. Edman Barbara PARAS, DO  Active   Multiple Vitamin (MULTIVITAMIN) tablet [id number]  Take 1 tablet by mouth daily. [provider]  Active Self  polyethylene glycol powder (GLYCOLAX /MIRALAX ) 17 GM/SCOOP powder [id number]  Take 17-34 g by mouth daily as needed for moderate constipation. May increase up to 3  to 4 capfuls if needed short term for constipation. Edman Barbara PARAS, DO  Active Self  primidone  (MYSOLINE ) 50 MG tablet [id number]  Take 0.5-1 tablets (25-50 mg total) by mouth at bedtime. For tremors Edman Barbara PARAS, DO  Active Self  rosuvastatin  (CRESTOR ) 5 MG tablet [id number]  Take 1 tablet (5 mg total) by mouth at bedtime. Edman Barbara PARAS, DO  Active Self  sodium chloride  1 g tablet [id number]  Take 1 tablet (1 g total) by mouth 2 (two) times daily with a meal. Barbara Edelman, DO  Active   thiamine  (VITAMIN B1) 100 MG tablet [id number]  Take 1 tablet (100 mg total) by mouth daily. Barbara Santos, Natalie, DO  Active   traZODone  (DESYREL ) 50 MG tablet [id number]  Take 1-2 tablets (50-100 mg total) by mouth at bedtime as needed for sleep. Barbara Santos, Natalie, DO  Active   valsartan  (DIOVAN ) 40 MG tablet [id number]  Take 1.5 tablets (60 mg total) by mouth daily. Edman Barbara PARAS, DO  Active Self              Assessment/Plan:     Diabetes: - Have reviewed long term cardiovascular and renal outcomes of uncontrolled blood sugar. - Review dietary modifications including importance of having regular well-balanced meals and snacks throughout the day, while controlling carbohydrate portion sizes             Encourage to avoid consumption of sugary beverages and limit consumption of sweets             Advise against skipping meals - Review Novolog  direction per sliding scale from PCP if needed based on pre-meal blood sugar, to be used only according to sliding scale. Review to administer Novolog  dose within 5 to 10 minutes before the meal Reiterate with patient that Novolog  should not be used if either pre-meal blood sugar is less than 121 or if she is not eating a full meal.  - Discuss with patient and son option to increase metformin  ER dose, but patient prefers to wait and discuss again in ~2 weeks/during our next call - Review counseling on s/s of low blood sugar  and how to treat lows Review rules of 15s - importance of using 15 grams of sugar to treat low, recheck blood sugar in 15 minutes, treat again if remains low or, if back to normal, having meal if mealtime or snack  Review examples of sources of 15 grams of sugar Again encourage patient to pick up new supply of glucose tablets (as current supply may be expired) - Patient denies interest in using continuous glucose monitoring (CGM) at this time - Provide education on OneTouch Verio testing supplies Note per letter patient has from insurance, will need to change testing supplies for Accu Chek Guide Me or True Metrix in 2026 for coverage - Identify patient needing new supply of OneTouch Verio test strips and pen needles Send new orders (with updated testing frequency on test strips) [facility] - Recommend to continue to monitor home blood sugar, keep log of results and have this record to review at upcoming medical appointments.  Patient to contact provider office sooner if needed for readings outside of established parameters or symptoms, particularly for any signs of hypoglycemia     Follow Up Plan:    Clinical Pharmacist will follow up with patient by telephone on 04/30/2024 at 10:00 AM    Patient/son agree to contact Clinical Pharmacist if receives response from [employer] in the mail sooner     Santos Sia, PharmD, [place], CPP Clinical Pharmacist [facility] [phone]   "

## 2024-04-21 ENCOUNTER — Other Ambulatory Visit: Payer: Self-pay | Admitting: Pharmacist

## 2024-04-21 ENCOUNTER — Other Ambulatory Visit: Payer: Self-pay | Admitting: Family Medicine

## 2024-04-21 DIAGNOSIS — E871 Hypo-osmolality and hyponatremia: Secondary | ICD-10-CM

## 2024-04-21 DIAGNOSIS — Z794 Long term (current) use of insulin: Secondary | ICD-10-CM

## 2024-04-21 DIAGNOSIS — E1169 Type 2 diabetes mellitus with other specified complication: Secondary | ICD-10-CM

## 2024-04-21 MED ORDER — ACCU-CHEK SOFTCLIX LANCETS MISC: 3 refills | Status: AC

## 2024-04-21 MED ORDER — SODIUM CHLORIDE 1 G PO TABS: 1.0000 g | ORAL_TABLET | Freq: Two times a day (BID) | ORAL | 2 refills | Status: DC

## 2024-04-21 MED ORDER — ACCU-CHEK GUIDE ME W/DEVICE KIT: PACK | 0 refills | Status: AC

## 2024-04-21 MED ORDER — ACCU-CHEK GUIDE TEST VI STRP: ORAL_STRIP | 3 refills | Status: AC

## 2024-04-21 NOTE — Progress Notes (Signed)
 Patient requesting refill of sodium chloride  tablets.   If approved, would you please send [facility]?  Thank you!

## 2024-04-21 NOTE — Patient Instructions (Signed)
 Goals Addressed             This Visit's Progress    Pharmacy Goals       The goal A1c is less than 7%. This is the best way to reduce the risk of the long term complications of diabetes, including heart disease, kidney disease, eye disease, strokes, and nerve damage. An A1c of less than 7% corresponds with fasting sugars less than 130 and 2 hour after meal sugars less than 180.   Our goal bad cholesterol, or LDL, is less than 70 . This is why it is important to continue taking your rosuvastatin .  Check your blood pressure once weekly, and any time you have concerning symptoms like headache, chest pain, dizziness, shortness of breath, or vision changes.   To appropriately check your blood pressure, make sure you do the following:  1) Avoid caffeine, exercise, or tobacco products for 30 minutes before checking. Empty your bladder. 2) Sit with your back supported in a flat-backed chair. Rest your arm on something flat (arm of the chair, table, etc). 3) Sit still with your feet flat on the floor, resting, for at least 5 minutes.  4) Check your blood pressure. Take 1-2 readings.  5) Write down these readings and bring with you to any provider appointments.  Bring your home blood pressure machine with you to a provider's office for accuracy comparison at least once a year.   Make sure you take your blood pressure medications before you come to any office visit, even if you were asked to fast for labs.  Sharyle Sia, PharmD, [facility] [phone]

## 2024-04-21 NOTE — Progress Notes (Signed)
" ° °  04/21/2024  Patient ID: Barbara Santos, female   DOB: 10/09/1935, 88 y.o.   MRN: [id number]  Receive a voicemail message from patient's son requesting a call back. Per his message, [facility] pen needles and test strip prescriptions and was advised by pharmacy that they did not have prescriptions for either for patient.  Outreach to [facility] this morning on behalf of patient. Speak with Constance who advises that patient has picked up the prescription for her pen needles. Constance reviews the One Touch Verio test strip prescription that was received and advises that patient's insurance is no longer covering this brand. Advises that Accu Chek Guide test strips will go through for patient, but that insurance is limiting to a quantity of 3 times/day testing (testing more frequently requires a prior authorization)  Submit PA request for Accu-Chek Guide Test strips to allow patient to test up to 5 times daily via CoverMyMeds on behalf of patient today. - PA approved through 04/21/2025 - New prescriptions for Accu Check Guide meter, test strips and lancets sent [facility]  [facility]. Speak with Harlene who re-runs prescriptions for Accu Chek Guide meter, test strips and lancets for patient and confirms that all 3 go through insurance as written. States that pharmacy will get these prescriptions ready for patient today  Follow up with both patient and son to provide an update.  - Confirms that she picked up the prescription for pen needles.  - Verbalizes understanding of plan to switch to new Accu Chek Guide glucometer and supplies. States will ask [facility] to go over new glucometer with her - Advise son on option to review how to use video for Accu Check Guide meter with patient if needed  Son/patient request renewal of sodium chloride  prescription   Send this request to PCP   Sharyle Sia, PharmD,  Barbara Santos, CPP Clinical Pharmacist [facility] [phone]  "

## 2024-04-23 ENCOUNTER — Ambulatory Visit: Payer: Self-pay

## 2024-04-23 NOTE — Telephone Encounter (Signed)
 FYI Only or Action Required?: FYI only for provider: appointment scheduled on 04/25/2024.  Patient was last seen in primary care on 04/10/2024 by Edman Marsa PARAS, DO.  Called Nurse Triage reporting Hypertension.  Symptoms began today.  Interventions attempted: Nothing.  Symptoms are: unchanged.  Triage Disposition: See Physician Within 24 Hours  Patient/caregiver understands and will follow disposition?: Yes   Copied from CRM #[id number]. Topic: Clinical - Home Health Verbal Orders >> Apr 23, 2024  1:40 PM Delon DASEN wrote: Caller/Agency: Amy with [facility] Callback Number: [phone] Service Requested: Physical Therapy Frequency: n/a Any new concerns about the patient? Yes  BP high reading 162/78 Reason for Disposition  Systolic BP >= 180 OR Diastolic >= 110  Answer Assessment - Initial Assessment Questions 1. BLOOD PRESSURE: What is your blood pressure? Did you take at least two measurements 5 minutes apart?     162/78 2. ONSET: When did you take your blood pressure?     today 3. HOW: How did you take your blood pressure? (e.g., automatic home BP monitor, visiting nurse)     automatic 4. HISTORY: Do you have a history of high blood pressure?     yes 5. MEDICINES: Are you taking any medicines for blood pressure? Have you missed any doses recently?     no 6. OTHER SYMPTOMS: Do you have any symptoms? (e.g., blurred vision, chest pain, difficulty breathing, headache, weakness)     no 7. PREGNANCY: Is there any chance you are pregnant? When was your last menstrual period?     Na  PT called and nurse also spoke with pt about elevated BP readings of [address], 170/78.  Pt stated she has not missed any doses and is not currently having any s/s of high BP at present time.  Nurse schedule appt on 04/25/2024 at PCP office with a different provider to follow up on elevated readings  Protocols used: Blood Pressure - High-A-AH

## 2024-04-25 ENCOUNTER — Ambulatory Visit

## 2024-04-25 VITALS — BP 180/90 | HR 84 | Ht 61.0 in | Wt 93.4 lb

## 2024-04-25 DIAGNOSIS — E113299 Type 2 diabetes mellitus with mild nonproliferative diabetic retinopathy without macular edema, unspecified eye: Secondary | ICD-10-CM

## 2024-04-25 DIAGNOSIS — E871 Hypo-osmolality and hyponatremia: Secondary | ICD-10-CM

## 2024-04-25 DIAGNOSIS — I10 Essential (primary) hypertension: Secondary | ICD-10-CM

## 2024-04-25 NOTE — Progress Notes (Signed)
 "    Acute Patient Visit  Physician: Bertil Brickey A Naveen Clardy, MD  Patient: Barbara Santos MRN: [id number] DOB: 05-Feb-1936 PCP: Edman Marsa PARAS, DO   This patient is not established with this clinician as his/her primary care physician. The visit was scheduled in a standard 20-minute slot that was not appropriate for the patients clinical complexity or acuity.  The scope, constraints, and potential risks associated with the visit structure were discussed with the patient. Evaluation and management were performed to address prioritized concerns based on information available at the time of the encounter, with recommendations for appropriate follow-up and further evaluation.  The patient is to seek additional or emergency care if any change in their condition.   Subjective:   Chief Complaint  Patient presents with   Hypertension    Patient is concerned about her BP and sugar levels.     HPI: The patient is a 89 y.o. female who presents today for:   Discussed the use of AI scribe software for clinical note transcription with the patient, who gave verbal consent to proceed.  History of Present Illness   Barbara Santos is an 89 year old female with hypertension and diabetes who presents with concerns about high blood pressure and recent episodes of low sodium and blood sugar.  Hypertension - Consistently elevated blood pressure since starting sodium tablets twice daily in November - Blood pressure readings as high as 180/90 today in office but was 156/74 at home this morning - Blood pressure was within normal range at hospital visit on December 16th - Takes blood pressure medication in the morning   Hyponatremia - Initiated sodium tablets twice daily in November due to previous low sodium levels/SIADH - Experienced a fall attributed to low sodium, resulting in emergency room visit and subsequent rehabilitation at [facility] - Sodium levels were normal at last  hospital check on December 16th  Hypoglycemia and diabetes management - History of diabetes managed with insulin  therapy since hospitalization. She was previously on metformin  - Blood glucose dropped to 65 on December 16th, prompting emergency room visit - asymptomatic - No further episodes of hypoglycemia since December 16th - she is on sliding scale mealtime insulin  - Uses a long-acting insulin  pen at lunchtime regardless of blood glucose readings    ROS:   As noted in the HPI    ASSESMENT/PLAN:  Encounter Diagnoses  Name Primary?   Hyponatremia Yes   Type 2 diabetes mellitus with mild nonproliferative retinopathy without macular edema, without long-term current use of insulin , unspecified laterality (HCC)    Essential hypertension     Orders Placed This Encounter  Procedures   CBC with Differential/Platelet   Comprehensive metabolic panel with GFR   Hemoglobin A1c   B12 and Folate Panel    Assessment and Plan    Hyponatremia Likely due to inadequate dietary intake and SIADH - asymptomatic. - Held trazodone  as it may contribute.  Melatonin for insomnia 3mg .  - Held sodium tablets over the weekend until lab seen.  Labs now.  - Follow up with Doctor Montgomery next week.   Type 2 diabetes mellitus Recent hypoglycemic episode with blood sugar at 65 mg/dL. Currently stable on insulin  therapy. Beta blockers - even eye drops like timolol  may mask hypoglycemia symptoms.  She did have an asymptomatic episode - Ordered blood work including hemoglobin A1c. - Continue current insulin  regimen. - Monitor blood sugar levels closely. - Discuss insulin  regimen with PCP for potential adjustments.   Hypertension  Recent elevated readings of 180/190 mmHg, possibly due to sodium tablets. Blood pressure has been variable but in a more appropriate range at home - Held sodium tablets over the weekend. - Monitor blood pressure closely - Follow up with PCP.  Needs 40 min visit       OBJECTIVE: Vitals:   04/25/24 1417  BP: (!) 180/90  Pulse: 84  SpO2: 98%  Weight: 93 lb 6.4 oz (42.4 kg)  Height: 5' 1 (1.549 m)    Body mass index is 17.65 kg/m.   Physical Exam Vitals reviewed.  Constitutional:      Appearance: Normal appearance. Well-developed with normal weight.  Cardiovascular:     Rate and Rhythm: Normal rate and regular rhythm. Normal heart sounds. LE edema +2 pitting. Normal peripheral pulses Pulmonary:     Normal breath sounds with normal effort Skin:    General: Skin is warm and dry without noticeable rash. Neurological:     General: No focal deficit present.  Psychiatric:        Mood and Affect: Mood, behavior and cognition normal       Allergies Patient is allergic to amoxicillin and penicillins.  Past Medical History Patient  has a past medical history of Arthritis, Basal cell carcinoma (08/23/2009), Basal cell carcinoma (06/29/2021), Diabetes mellitus, type 2 (HCC), Dysplastic nevus (08/03/2008), Dysplastic nevus (08/23/2009), Glaucoma, basal cell carcinoma, squamous cell carcinoma of skin (11/01/2009), Hyperlipidemia, Hypothyroidism, Insomnia, Left hip pain, Protein calorie malnutrition, and Squamous cell carcinoma of skin (08/23/2009).  Surgical History Patient  has a past surgical history that includes Total abdominal hysterectomy (2009); Hernia repair; Colonoscopy with propofol  (N/A, 12/31/2017); Cataract extraction w/PHACO (Left, 12/31/2019); and Cataract extraction w/PHACO (Right, 01/28/2020).  Family History Pateint's family history includes Breast cancer in her sister; Breast cancer (age of onset: 6) in her sister; Diabetes Mellitus II in her mother; Heart disease in her paternal grandfather; Stroke in her father and maternal grandmother.  Social History Patient  reports that she has never smoked. She has never used smokeless tobacco. She reports that she does not drink alcohol and does not use drugs.    04/25/2024 "

## 2024-04-28 ENCOUNTER — Ambulatory Visit: Payer: Self-pay

## 2024-04-28 DIAGNOSIS — E871 Hypo-osmolality and hyponatremia: Secondary | ICD-10-CM

## 2024-04-28 NOTE — Telephone Encounter (Signed)
 FYI Only or Action Required?: Action required by provider: clinical question for provider.  Patient was last seen in primary care on 04/25/2024 by Zafirov, Clarissa A, MD.  Called Nurse Triage reporting Release of Information.  Symptoms began several days ago.  Interventions attempted: Prescription medications: Valsartan .  Symptoms are: unchanged.  Triage Disposition: See PCP When Office is Open (Within 3 Days)  Patient/caregiver understands and will follow disposition?:    Copied from CRM #[id number]. Topic: Clinical - Red Word Triage >> Apr 28, 2024  2:02 PM Shanda MATSU wrote: Red Word that prompted transfer to Nurse Triage: Amy w(PTA)/[facility] calling to report patient's BP is high, the reading she is getting is 168/70, also reporting blood sugar readings are between 183-279. Reason for Disposition  Systolic BP >= 160 OR Diastolic >= 100  Answer Assessment - Initial Assessment Questions 1. REASON FOR CALL: What is the main reason for your call? or How can I best help you?     Previously call MD, seen Friday, LOV 04/25/24, BP readings are high; 168/70 HR 74 today.  Took all BP meds today, no missed doses.  Denies dizziness, HA, blurred vision, weakness/ numbness. Denies any symptoms  Glucose reading 183-279 range. Blood glucose 234 this lunch, took 5 units at lunch.  Answer Assessment - Initial Assessment Questions Patient requesting call back regarding blood pressure readings and medication adjustment.  Appt already scheduled 05/01/24.  Advised 911 if symptoms worsen. Patient verbalized understanding.  1. BLOOD PRESSURE: What is your blood pressure? Did you take at least two measurements 5 minutes apart?     BP 168/70 HR 74 now; asymptomatic 2. ONSET: When did you take your blood pressure?     Week ago 3. HOW: How did you take your blood pressure? (e.g., automatic home BP monitor, visiting nurse)     maunal 4. HISTORY: Do you have a history of high  blood pressure?     yes 5. MEDICINES: Are you taking any medicines for blood pressure? Have you missed any doses recently?     Valsartan ; took this morning, no missed doses 6. OTHER SYMPTOMS: Do you have any symptoms? (e.g., blurred vision, chest pain, difficulty breathing, headache, weakness) Denies dizziness, HA, blurred vision, weakness/ numbness. Denies any symptom  Protocols used: Information Only Call - No Triage-A-AH, Blood Pressure - High-A-AH

## 2024-04-28 NOTE — Telephone Encounter (Signed)
 Could you call patient / family back to just review with them and confirm upcoming appointments?  Weds 1/7 at 10am with phone call [facility] for pharmacy  I see some confusion with her follow-up appointments.  It looks like she was recently seen by Dr JENEANE last week and some labs were done, for some reason one of the labs the Chemistry was not drawn or released.  Thurs 1/8 at  1120am for visit with me in office for follow-up that was recently scheduled.  However she already had lab on 1/15 and visit with me on 1/22  I am not sure she needs all of those apts with me. Mainly she needs sodium re-check and kidney results, but they are not added on to her lab work.  If possible - can you contact [facility] to add on the Comprehensive Metabolic Panel CMET that Dr JENEANE attempted to order back on 04/25/24 but it was not drawn?  Diagnosis Hyponatremia E87.1 to add it on to the blood already collected from 04/25/24  I would likely plan to see her on Thurs 05/01/24 and we can probably cancel the 1/15 and 1/22 apts but let's wait to see her on 05/01/24 first.  Marsa Officer, DO [facility] 04/28/2024, 2:41 PM

## 2024-04-28 NOTE — Telephone Encounter (Signed)
 Left message for patient, notifying her of all up coming appointments this week.

## 2024-04-29 ENCOUNTER — Other Ambulatory Visit (HOSPITAL_COMMUNITY): Payer: Self-pay

## 2024-04-30 ENCOUNTER — Other Ambulatory Visit

## 2024-05-01 ENCOUNTER — Ambulatory Visit: Admitting: Family Medicine

## 2024-05-01 ENCOUNTER — Telehealth: Payer: Self-pay

## 2024-05-01 ENCOUNTER — Encounter: Payer: Self-pay | Admitting: Family Medicine

## 2024-05-01 VITALS — BP 138/88 | HR 59 | Ht 61.0 in | Wt 90.5 lb

## 2024-05-01 DIAGNOSIS — E222 Syndrome of inappropriate secretion of antidiuretic hormone: Secondary | ICD-10-CM

## 2024-05-01 DIAGNOSIS — J432 Centrilobular emphysema: Secondary | ICD-10-CM

## 2024-05-01 DIAGNOSIS — E162 Hypoglycemia, unspecified: Secondary | ICD-10-CM

## 2024-05-01 DIAGNOSIS — Z794 Long term (current) use of insulin: Secondary | ICD-10-CM

## 2024-05-01 DIAGNOSIS — E113293 Type 2 diabetes mellitus with mild nonproliferative diabetic retinopathy without macular edema, bilateral: Secondary | ICD-10-CM | POA: Diagnosis not present

## 2024-05-01 DIAGNOSIS — E871 Hypo-osmolality and hyponatremia: Secondary | ICD-10-CM

## 2024-05-01 DIAGNOSIS — I10 Essential (primary) hypertension: Secondary | ICD-10-CM | POA: Diagnosis not present

## 2024-05-01 MED ORDER — INSULIN GLARGINE 100 UNIT/ML SOLOSTAR PEN: 12.0000 [IU] | PEN_INJECTOR | Freq: Every day | SUBCUTANEOUS | 0 refills | Status: AC

## 2024-05-01 MED ORDER — SODIUM CHLORIDE 1 G PO TABS: 1.0000 g | ORAL_TABLET | Freq: Every day | ORAL | 0 refills | Status: AC

## 2024-05-01 MED ORDER — GVOKE HYPOPEN 2-PACK 1 MG/0.2ML ~~LOC~~ SOAJ: 1.0000 mg | SUBCUTANEOUS | 2 refills | Status: AC | PRN

## 2024-05-01 NOTE — Progress Notes (Signed)
 "  Subjective:    Patient ID: Barbara Santos, female    DOB: [birth year], 89 y.o.   MRN: [id number]  Barbara Santos is a 89 y.o. female presenting on 05/01/2024 for Medical Management of Chronic Issues and Diabetes   HPI  Discussed the use of AI scribe software for clinical note transcription with the patient, who gave verbal consent to proceed.  History of Present Illness   Barbara Santos is an 88 year old female with hypertension and diabetes who presents with elevated blood pressure and blood sugar levels.  Type 2 Diabetes Hyperglycemia and glycemic variability - Persistent elevated blood glucose levels, mostly in the 200s, with occasional readings as high as 300 and as low as 152 - A1c recorded at 8.0 on April 25, 2024 - A1c has fluctuated between 7.3 and 8.7 over the past two years - Occasional symptoms of hypoglycemia, including diaphoresis and weakness, managed by consuming juice - She has Glucagon  pen but unfamiliar with usage technique  - Current regimen includes insulin  and metformin  - Metformin  XR 500mg : one tablet daily - note she has improved overall sugar control since resuming metformin  - Insulin  SSI Novolog  administered twice daily at lunchtime and once before meals, see chart with scale. Not changed recently - Glargine: 10 units daily at lunchtime - Occasionally skips lunchtime novolog  insulin  if glucose levels are within range - Has experienced instances where mealtime insulin  was not required due to adequate glucose levels  Hypertension and blood pressure control - Persistent elevated blood pressure despite current treatment regimen - Improved recently including today - Taking sodium tablets twice daily    Centrilobular Emphysema On prior imaging Not on maintenance therapy Respiratory status is baseline      04/25/2024    4:15 PM 01/31/2024    2:14 PM 10/09/2023   10:29 AM  Depression screen PHQ 2/9  Decreased Interest 0 0 0  Down,  Depressed, Hopeless 0 0 0  PHQ - 2 Score 0 0 0  Altered sleeping 0  0  Tired, decreased energy 0  0  Change in appetite 0  0  Feeling bad or failure about yourself  0  0  Trouble concentrating 0  0  Moving slowly or fidgety/restless 0  0  Suicidal thoughts 0  0  PHQ-9 Score 0  0   Difficult doing work/chores Not difficult at all  Not difficult at all     Data saved with a previous flowsheet row definition       04/25/2024    4:16 PM 10/09/2023   10:29 AM 04/06/2023    9:52 AM 10/04/2022    8:45 AM  GAD 7 : Generalized Anxiety Score  Nervous, Anxious, on Edge 0 0 0 0  Control/stop worrying 0 0 0 0  Worry too much - different things 0 0 0 0  Trouble relaxing 0 0 0 0  Restless 0 0 0 0  Easily annoyed or irritable 0 0 0 0  Afraid - awful might happen 0 0 0 0  Total GAD 7 Score 0 0 0 0  Anxiety Difficulty Not difficult at all Not difficult at all  Not difficult at all    Social History[1]  Review of Systems Per HPI unless specifically indicated above     Objective:    BP 138/88 (BP Location: Left Arm, Cuff Size: Normal)   Pulse (!) 59   Ht 5' 1 (1.549 m)   Wt 90 lb 8 oz (41.1 kg)  SpO2 98%   BMI 17.10 kg/m   Wt Readings from Last 3 Encounters:  05/01/24 90 lb 8 oz (41.1 kg)  04/25/24 93 lb 6.4 oz (42.4 kg)  04/10/24 95 lb (43.1 kg)    Physical Exam Vitals and nursing note reviewed.  Constitutional:      General: She is not in acute distress.    Appearance: Normal appearance. She is well-developed. She is not diaphoretic.     Comments: Well-appearing thin elderly 89 yr female, comfortable, cooperative  HENT:     Head: Normocephalic and atraumatic.  Eyes:     General:        Right eye: No discharge.        Left eye: No discharge.     Conjunctiva/sclera: Conjunctivae normal.  Neck:     Thyroid : No thyromegaly.  Cardiovascular:     Rate and Rhythm: Normal rate and regular rhythm.     Heart sounds: Normal heart sounds. No murmur heard. Pulmonary:      Effort: Pulmonary effort is normal. No respiratory distress.     Breath sounds: Normal breath sounds. No wheezing or rales.  Musculoskeletal:        General: Normal range of motion.     Cervical back: Normal range of motion and neck supple.     Right lower leg: Edema (trace to +1 feet/ankles) present.     Left lower leg: Edema present.  Lymphadenopathy:     Cervical: No cervical adenopathy.  Skin:    General: Skin is warm and dry.     Findings: No erythema or rash.  Neurological:     Mental Status: She is alert and oriented to person, place, and time.  Psychiatric:        Mood and Affect: Mood normal.        Behavior: Behavior normal.        Thought Content: Thought content normal.     Comments: Well groomed, good eye contact, normal speech and thoughts    I have personally reviewed the radiology report from 03/20/24 on CT.   EXAM: CT CHEST, ABDOMEN AND PELVIS WITH CONTRAST 03/20/2024 06:07:06 PM   TECHNIQUE: CT of the chest, abdomen and pelvis was performed with the administration of 80 mL of iohexol  (OMNIPAQUE ) 300 MG/ML solution. Multiplanar reformatted images are provided for review. Automated exposure control, iterative reconstruction, and/or weight based adjustment of the mA/kV was utilized to reduce the radiation dose to as low as reasonably achievable.   COMPARISON: Ultrasound pelvis 05/12/2014.   CLINICAL HISTORY: Metastatic disease evaluation.   FINDINGS:   CHEST: MEDIASTINUM AND LYMPH NODES: Heart and pericardium are unremarkable. The central airways are clear. No mediastinal, hilar or axillary lymphadenopathy.   LUNGS AND PLEURA: Scarring in the lung apices. Mild emphysematous changes in the lungs with scattered interstitial fibrosis. Scattered pulmonary nodules are present, most prominent in the right apex, image 21, measuring 6 mm in diameter and in the right middle lung, image 70, measuring 2.5 mm diameter. No pleural effusion or pneumothorax.    ABDOMEN AND PELVIS:   LIVER: Diffuse fatty infiltration of the liver. Subcentimeter low attenuation lesion in the lateral segment of the left lobe is too small to characterize but likely represents a small cyst or hemangioma.   GALLBLADDER AND BILE DUCTS: Cholelithiasis with small stones in the gallbladder. No wall thickening or inflammatory stranding. No biliary ductal dilatation.   SPLEEN: No acute abnormality.   PANCREAS: No acute abnormality.   ADRENAL GLANDS: No acute  abnormality.   KIDNEYS, URETERS AND BLADDER: 6 cm simple appearing cyst in the lower pole of the right kidney. No imaging follow-up is indicated. No stones in the kidneys or ureters. No hydronephrosis. No perinephric or periureteral stranding. Urinary bladder is unremarkable.   GI AND BOWEL: Stool-filled colon. The colon is not abnormally distended. No wall thickening or inflammatory stranding is identified. Stomach and small bowel demonstrate no acute abnormality. There is no bowel obstruction.   REPRODUCTIVE ORGANS: No acute abnormality.   PERITONEUM AND RETROPERITONEUM: No ascites. No free air. No mesenteric mass or collection is seen.   VASCULATURE: Calcification of the aorta. No aneurysm or dissection.   ABDOMINAL AND PELVIS LYMPH NODES: No lymphadenopathy.   BONES AND SOFT TISSUES: Degenerative changes in the spine and hips. Old left rib fractures. No focal bone lesions. No focal soft tissue abnormality.   IMPRESSION: 1. No evidence of metastatic disease. 2. Scattered pulmonary nodules measuring up to 6 mm in the right apex and 2.5 mm in the right middle lobe. Given oncologic indication and incomplete applicability of Fleischner guidelines, recommend non-contrast chest CT in 612 months to assess stability, with consideration of PET/CT or tissue sampling if interval growth or suspicious features develop. 3. Mild emphysema, which is an independent lung cancer risk factor. For patients  aged 87, consider evaluation for a low-dose CT lung cancer screening program. 4. Cholelithiasis without acute cholecystitis.   Electronically signed by: Elsie Gravely MD 03/20/2024 06:25 PM EST RP Workstation: [id number]  Results for orders placed or performed in visit on 04/25/24  CBC with Differential/Platelet   Collection Time: 04/25/24  3:10 PM  Result Value Ref Range   WBC 6.7 3.8 - 10.8 Thousand/uL   RBC 3.95 3.80 - 5.10 Million/uL   Hemoglobin 12.2 11.7 - 15.5 g/dL   HCT 62.2 64.0 - 53.9 %   MCV 95.4 81.4 - 101.7 fL   MCH 30.9 27.0 - 33.0 pg   MCHC 32.4 31.6 - 35.4 g/dL   RDW 87.8 88.9 - 84.9 %   Platelets 325 140 - 400 Thousand/uL   MPV 10.0 7.5 - 12.5 fL   Neutro Abs 4,074 1,500 - 7,800 cells/uL   Absolute Lymphocytes 1,749 850 - 3,900 cells/uL   Absolute Monocytes 750 200 - 950 cells/uL   Eosinophils Absolute 101 15 - 500 cells/uL   Basophils Absolute 27 0 - 200 cells/uL   Neutrophils Relative % 60.8 %   Total Lymphocyte 26.1 %   Monocytes Relative 11.2 %   Eosinophils Relative 1.5 %   Basophils Relative 0.4 %  Hemoglobin A1c   Collection Time: 04/25/24  3:10 PM  Result Value Ref Range   Hgb A1c MFr Bld 8.0 (H) <5.7 %   Mean Plasma Glucose 183 mg/dL   eAG (mmol/L) 89.8 mmol/L  B12 and Folate Panel   Collection Time: 04/25/24  3:10 PM  Result Value Ref Range   Vitamin B-12 1,043 200 - 1,100 pg/mL   Folate >24.0 ng/mL  TEST AUTHORIZATION   Collection Time: 04/25/24  3:10 PM  Result Value Ref Range   TEST NAME: COMPREHENSIVE METABOLIC    TEST CODE: [id number]    CLIENT CONTACT: MARYANN BARBOUR    REPORT ALWAYS MESSAGE SIGNATURE        Assessment & Plan:   Problem List Items Addressed This Visit     Centrilobular emphysema (HCC)   Essential hypertension   Relevant Orders   Comprehensive metabolic panel with GFR   CBC with Differential/Platelet  TSH   T4, free   Hyponatremia   Relevant Medications   sodium chloride  1 g tablet   Other Relevant  Orders   Comprehensive metabolic panel with GFR   Osmolality   Osmolality, urine   Urinalysis, Routine w reflex microscopic   Sodium, urine, random   SIADH (syndrome of inappropriate ADH production)   Relevant Orders   TSH   T4, free   Osmolality   Osmolality, urine   Urinalysis, Routine w reflex microscopic   Sodium, urine, random   Type II diabetes mellitus (HCC) - Primary   Relevant Medications   insulin  glargine (LANTUS ) 100 UNIT/ML Solostar Pen   GVOKE HYPOPEN  2-PACK 1 MG/0.2ML SOAJ   Other Visit Diagnoses       Hypoglycemia       Relevant Medications   GVOKE HYPOPEN  2-PACK 1 MG/0.2ML SOAJ     Long term current use of insulin  (HCC)             Type 2 diabetes mellitus with retinopathy Persistent hyperglycemia with A1c of 8.0. Current regimen includes metformin  and insulin .  Discussed risks of hypoglycemia with increased insulin . Hyperglycemia based on recent readings - Increased glargine insulin  from 10 units to 12 units daily at lunchtime. - Continue Sliding Scale Insulin  SSI  we discussed goal to limit this in future, but no major change on this today - Continue metformin  XR 500mg  once daily. - Monitor blood glucose levels and adjust insulin  as needed. - Discuss potential new medications in Januvia DPP4 if we can get financial assistance, still waiting on information from social security. [facility] pharmacy team is working on this - Ordered GVOKE emergency glucagon  pen for hypoglycemia management. For easier use, we reviewed usage technique of glucagon  vial and syringe but have concerns about family's ability to admin, they will ask pharmacist for demo instructions  Essential hypertension Blood pressure remains elevated, possibly related to sodium intake / volume however no obvious edema today - Reduced sodium tablets from twice daily to once daily in the morning. - Monitor blood pressure response to sodium reduction.  Hyponatremia / SIADH likely Hospital course with  Hyponatremia recently and work up confirming suggested SIADH, with serum Osm 250s and Urine Osm 500s Last lab chemistry was not completed and appears my attempt to add on was not successful as it remains in progress for past 48 hours.  Scheduled already for lab next week 1/15 will repeat lab there - Continue to monitor sodium levels and adjust treatment as needed.  - On repeat chemistry we will determine current progress and maintenance level of sodium. - She will likely need strict Fluid Restriction [phone] mL daily and likely continue some dose range of sodium tablets 1g daily now or can return to 1g twice a day if needed, last option is add Furosemide loop diuretic if tolerated however I am concerned this may not be well tolerated. - Consider Endocrine consult in future if this issue remains problem     Add serum  Osmality, Urine sodium and Osm along with chemistry cbc thyroid    Orders Placed This Encounter  Procedures   Comprehensive metabolic panel with GFR    Standing Status:   Future    Expected Date:   05/08/2024    Expiration Date:   08/06/2024   CBC with Differential/Platelet    Standing Status:   Future    Expected Date:   05/08/2024    Expiration Date:   08/06/2024   TSH    Standing  Status:   Future    Expected Date:   05/08/2024    Expiration Date:   08/06/2024   T4, free    Standing Status:   Future    Expected Date:   05/08/2024    Expiration Date:   08/06/2024   Osmolality    Standing Status:   Future    Expected Date:   05/08/2024    Expiration Date:   08/06/2024   Osmolality, urine    Standing Status:   Future    Expected Date:   05/08/2024    Expiration Date:   08/06/2024   Urinalysis, Routine w reflex microscopic    Standing Status:   Future    Expected Date:   05/08/2024    Expiration Date:   08/06/2024   Sodium, urine, random    Standing Status:   Future    Expected Date:   05/08/2024    Expiration Date:   08/06/2024    Meds ordered this encounter  Medications    insulin  glargine (LANTUS ) 100 UNIT/ML Solostar Pen    Sig: Inject 12 Units into the skin daily with lunch.    Dispense:  15 mL    Refill:  0   sodium chloride  1 g tablet    Sig: Take 1 tablet (1 g total) by mouth daily with breakfast.    Dispense:  30 tablet    Refill:  0    Reduce from twice a day to 1 a day   GVOKE HYPOPEN  2-PACK 1 MG/0.2ML SOAJ    Sig: Inject 1 mg into the skin as needed (hypoglycemia).    Dispense:  1 mL    Refill:  2    Follow up plan: Return in about 2 weeks (around 05/15/2024).  Future labs ordered for 05/08/24  Marsa Officer, DO [facility] 05/01/2024, 11:46 AM     [1]  Social History Tobacco Use   Smoking status: Never   Smokeless tobacco: Never  Vaping Use   Vaping status: Never Used  Substance Use Topics   Alcohol use: No    Alcohol/week: 0.0 standard drinks of alcohol   Drug use: No   "

## 2024-05-01 NOTE — Telephone Encounter (Signed)
 Called patient. Talked to Shade and advised her that she may take x 2 of the 3mg  melatonin tabs = dose 6mg  nightly.  Marsa Officer, DO [facility] 05/01/2024, 5:57 PM

## 2024-05-01 NOTE — Patient Instructions (Addendum)
 Thank you for coming to the office today.  Sharyle will call you next week, not quite scheduled yet.  Does adjustment today  Recent Labs    01/14/24 1500 03/20/24 1127 04/25/24 1510  HGBA1C 7.7* 8.0* 8.0*   Dose increase the DAILY LUNCHTIME (once per day) Glargine insulin  - instead of 10 units, go up to 12 units per day.  Keep sliding scale insulin .  GVOKE instant / automatic low sugar pen - emergency - if it is covered or low cost. Pick it up and use that if you need.  IF the GVOKE is too expensive, ask the pharmacist for a demonstration on the Glucagon .  Decrease Sodium tablet from 1 twice a day, down to 1 once a day in the morning.  BP has improved overall   Please schedule a Follow-up Appointment to: Return in about 2 weeks (around 05/15/2024).  If you have any other questions or concerns, please feel free to call the office or send a message through MyChart. You may also schedule an earlier appointment if necessary.  Additionally, you may be receiving a survey about your experience at our office within a few days to 1 week by e-mail or mail. We value your feedback.  Marsa Officer, DO [facility], [place]

## 2024-05-01 NOTE — Telephone Encounter (Signed)
 Copied from CRM [id number]. Topic: Clinical - Medical Advice >> May 01, 2024  4:13 PM Nathanel BROCKS wrote: Reason for CRM: pt takes 1 3 mg tablet of melatonon and wants to know if it can upped to 2 pills at 3 mg dose. Please advise pt.

## 2024-05-06 ENCOUNTER — Telehealth: Payer: Self-pay

## 2024-05-06 NOTE — Telephone Encounter (Signed)
 Copied from CRM [id number]. Topic: Clinical - Medication Question >> May 06, 2024 11:31 AM Victoria B wrote: Reason for CRM: patient called back Barbara Santos is she still needs to take her diabetic medication before her appt on Jan 15

## 2024-05-06 NOTE — Telephone Encounter (Signed)
 Spoke with patient, she understands this is not a fasting lab and she should eat and take her medication as usual. She also understands what to do in the future for fasting labs

## 2024-05-06 NOTE — Telephone Encounter (Signed)
 Please inform patient that the labs on 1/15 are not fasting labs. She does not need to fast for that one.  It does also include a urine test to help us  evaluate the sodium level.  She should eat normal breakfast and take her medicine as usual.  Also please let her know that in the future - if she ever has to fast, if she skips the meal she should skip the diabetes medication.  Marsa Officer, DO [facility] 05/06/2024, 12:20 PM

## 2024-05-07 ENCOUNTER — Other Ambulatory Visit

## 2024-05-07 DIAGNOSIS — E113293 Type 2 diabetes mellitus with mild nonproliferative diabetic retinopathy without macular edema, bilateral: Secondary | ICD-10-CM

## 2024-05-07 DIAGNOSIS — Z794 Long term (current) use of insulin: Secondary | ICD-10-CM

## 2024-05-07 NOTE — Progress Notes (Signed)
 "  05/07/2024 Name: Barbara Santos MRN: [id number] DOB: [birth year]  Chief Complaint  Patient presents with   Medication Management   Medication Assistance    Barbara Santos is a 89 y.o. year old female who presented for a telephone visit.  Today speak with patient and also patient's son/caregiver, Barbara Santos, and patient's daughter-in-law as requested     They were referred to the pharmacist by their PCP for assistance in managing diabetes and medication access.       Subjective:   Care Team: Primary Care Provider: Edman Marsa PARAS, DO ; Next Scheduled Visit: 05/15/2024 at 9:40 AM   Medication Access/Adherence  Current Pharmacy:  [id number] [place], [place] - [address] [place] [address] Phone: [phone] Fax: [phone]  [facility] - [place], [place] - [facility] AT [facility] to [facility] [address] [place] [address] Phone: [phone] Fax: [phone]  [phone] [place] [place] - [address] [place] [address] Phone: [phone] Fax: [phone]   Patient reports affordability concerns with their medications: Yes  Patient reports access/transportation concerns to their pharmacy: No  Patient reports adherence concerns with their medications:  No     Using weekly pillbox as adherence aid    Son reports patient has not yet received a letter in the mail from [employer] regarding Extra Help subsidy application submitted on 02/01/2024 - Son following up with Social Security to check on status   During our call, patient's son shares that patient is planning to change Primary Care Providers - Note son has already been in communication with current PCP office regarding plan for this change - As of today, caregivers state that they plan to bring patient tomorrow for lab appointment as well as for Office Visit with Dr. Edman  on 05/15/2024, but then to make change to new provider  Diabetes:   Current medications:  - metformin  ER 500 mg -1 tablet daily with breakfast              Reports tolerating well; denies side effects since restart - Lantus  Solostar 12 units once daily - states increased to current dose as directed by PCP on 05/01/2024 - Novolog  Flexpen - taking per sliding scale: Injects up to max 10 units 3 times a day with meals following sliding scale insulin . CBG 70 - 120: 0 units CBG 121 - 150: 2 units CBG 151 - 200: 3 units CBG 201 - 250: 5 units CBG 251 - 300: 8 units CBG 301 - 350: 10 units. Call for higher sugar.    Previous therapies tried: metformin  ER (stopped while in the hospital in November due to concern that it may have been impacting patient's appetite since increased to latest dose (metformin  ER 1000 mg twice daily) in September   Reports using One Touch Verio meter to check blood sugar between 3-5 times daily   Reports recent home blood sugar readings:   Before Breakfast Before Lunch Before Supper  9 - January [phone]  10 - January 132 279 95  11 - January 136 203 272  12 - January 151 250 293  13 - January 293 194 67*  14 - January 193     *During our call have patient review her insulin  pens and find that she has accidentally switched the placement of her Lantus  and Novolog  pens. Collaborate today with both son,  Barbara, and daughter-in-law as requested by patient for support. Daughter-in-law comes to patient's home to physically review insulins with pharmacist and patient today. Caregivers report that they have been monitoring patient's insulins closely, but seems that the Lantus  and Novolog  may have been accidentally switched yesterday and suspect patient may have taken 12 units of Novolog  yesterday at lunch time, rather than the Lantus  12 units as intended.    Patient denies hypoglycemic s/sx including dizziness, shakiness, sweating.  - Note patient carries glucose tablets with  her in case of hypoglycemia and glucagon  pen in case needed for severe lows      Objective:  Lab Results  Component Value Date   HGBA1C 8.0 (H) 04/25/2024    Lab Results  Component Value Date   CREATININE 0.46 04/09/2024   BUN 19 04/09/2024   NA 133 (L) 04/09/2024   K 4.5 04/09/2024   CL 97 (L) 04/09/2024   CO2 26 04/09/2024    Lab Results  Component Value Date   CHOL 138 03/30/2023   HDL 71 03/30/2023   LDLCALC 53 03/30/2023   TRIG 61 03/30/2023   CHOLHDL 1.9 03/30/2023    Medications Reviewed Today     Reviewed by Alana Sharyle LABOR, RPH-CPP (Pharmacist) on 05/08/24 at 1128  Med List Status: <None>   Medication Order Taking? Sig Documenting Provider Last Dose Status Informant  Accu-Chek Softclix Lancets lancets [id number] Yes Use to check blood sugar up to five times daily Karamalegos, Marsa PARAS, DO  Active   bisacodyl  (DULCOLAX) 10 MG suppository [id number] Yes Place 1 suppository (10 mg total) rectally daily as needed for moderate constipation. Alexander, Natalie, DO  Active   Blood Glucose Monitoring Suppl (ACCU-CHEK GUIDE ME) w/Device KIT [id number] Yes Use to check blood sugar up to five times daily Karamalegos, Marsa PARAS, DO  Active   Cholecalciferol  (VITAMIN D) 2000 UNITS tablet [id number] Yes Take 2,000 Units by mouth daily. [provider]  Active Self  dorzolamide -timolol  (COSOPT ) 22.3-6.8 MG/ML ophthalmic solution [id number] Yes Place 1 drop into both eyes 2 (two) times daily.  [provider]  Active Self           Med Note PECOLA, GRAYCE RAMAN   Thu Oct 21, 2015  8:48 AM) Received from: External Pharmacy Received Sig: INSTILL ONE DROP IN OU BID  glucose blood (ACCU-CHEK GUIDE TEST) test strip [id number] Yes Use to check blood sugar up to five times daily Edman Marsa PARAS, DO  Active   GVOKE HYPOPEN  2-PACK 1 MG/0.2ML EMMANUEL [id number]  Inject 1 mg into the skin as needed (hypoglycemia). Edman Marsa PARAS, DO  Active   insulin  aspart  (NOVOLOG  FLEXPEN) 100 UNIT/ML FlexPen [id number] Yes Inject up to max 10 units 3 times a day with meals following sliding scale insulin . CBG 70 - 120: 0 units CBG 121 - 150: 2 units CBG 151 - 200: 3 units CBG 201 - 250: 5 units CBG 251 - 300: 8 units CBG 301 - 350: 10 units. Call for higher sugar. Edman Marsa PARAS, DO  Active   insulin  glargine (LANTUS ) 100 UNIT/ML Solostar Pen [id number] Yes Inject 12 Units into the skin daily with lunch. Karamalegos, Marsa PARAS, DO  Active   Insulin  Pen Needle (PEN NEEDLES) 31G X 5 MM MISC [id number] Yes Dispense based on patient and insurance preference. Use up to four times daily as directed with Lantus  and Novolog . (FOR ICD-10 E11.9). Edman Marsa PARAS, DO  Active   lactose free nutrition (BOOST) LIQD [id number] Yes Take  237 mLs by mouth 3 (three) times daily between meals. [provider]  Active Self           Med Note (HILL, TIFFANY A   Tue Oct 24, 2016 10:06 AM) Drinks 1 a day  levothyroxine  (SYNTHROID ) 50 MCG tablet [id number] Yes Take 1 tablet (50 mcg total) by mouth daily before breakfast. Edman Marsa PARAS, DO  Active Self  magnesium  chloride (SLOW-MAG) 64 MG TBEC SR tablet [id number] Yes Take 1 tablet (64 mg total) by mouth daily. Alexander, Natalie, DO  Active   metFORMIN  (GLUCOPHAGE -XR) 500 MG 24 hr tablet [id number] Yes Take 1 tablet (500 mg total) by mouth daily with breakfast. Edman Marsa PARAS, DO  Active   Multiple Vitamin (MULTIVITAMIN) tablet [id number] Yes Take 1 tablet by mouth daily. [provider]  Active Self  polyethylene glycol powder (GLYCOLAX /MIRALAX ) 17 GM/SCOOP powder [id number] Yes Take 17-34 g by mouth daily as needed for moderate constipation. May increase up to 3 to 4 capfuls if needed short term for constipation. Edman Marsa PARAS, DO  Active Self  rosuvastatin  (CRESTOR ) 5 MG tablet [id number] Yes Take 1 tablet (5 mg total) by mouth at bedtime. Edman Marsa PARAS, DO  Active Self   sodium chloride  1 g tablet [id number]  Take 1 tablet (1 g total) by mouth daily with breakfast. Edman Marsa PARAS, DO  Active   thiamine  (VITAMIN B1) 100 MG tablet [id number] Yes Take 1 tablet (100 mg total) by mouth daily. Alexander, Natalie, DO  Active   valsartan  (DIOVAN ) 40 MG tablet [id number] Yes Take 1.5 tablets (60 mg total) by mouth daily. Edman Marsa PARAS, DO  Active Self              Assessment/Plan:   Provide thorough counseling to patient, son and daughter-in-law on the difference between patient's Novolog  rapid-acting insulin  that is to be given up to three times daily with meals ONLY if needed based on sliding scale versus Lantus  long-acting insulin  that is to be given as directed only once daily - Patient and caregivers verbalize understanding via teach back - Daughter-in-law also makes reminder note for patient on her insulin  directions that are posted on patient's fridge to record this information in case needed in future as reference - Encourage caregivers to continue to aid patient with adherence, including to now monitor insulin  management more closely   Diabetes: - Have reviewed long term cardiovascular and renal outcomes of uncontrolled blood sugar. - Review dietary modifications including importance of having regular well-balanced meals and snacks throughout the day, while controlling carbohydrate portion sizes             Encourage to avoid consumption of sugary beverages and limit consumption of sweets             Advise against skipping meals - Again discuss with patient/caregiver goal of gradually increasing back patient's metformin  ER dose, provided tolerated, in order to get patient back off of Novolog  meal-time insulin  (and plan to consider addition of Januvia in future once receive reply back from [employer] regarding Extra Help application) However, at this time, agree to hold off on any medication adjustments or further follow up as  patient is planning to change primary care provider offices - Review counseling on s/s of low blood sugar and how to treat lows - Patient denies interest in using continuous glucose monitoring (CGM) at this time - Recommend to continue to monitor home blood sugar, keep log of results and have this record  to review at upcoming medical appointments.  Patient to contact provider office sooner if needed for readings outside of established parameters or symptoms, particularly for any signs of hypoglycemia     Follow Up Plan:    Patient denies further medication questions or concerns today. No follow up scheduled as patient planning to change PCP offices  Provide patient with contact information for clinic pharmacist to contact if needed in future for medication questions/concerns    Sharyle Sia, PharmD, Barbara Santos, CPP Clinical Pharmacist [facility] [phone]   "

## 2024-05-08 ENCOUNTER — Other Ambulatory Visit

## 2024-05-08 DIAGNOSIS — I10 Essential (primary) hypertension: Secondary | ICD-10-CM

## 2024-05-08 DIAGNOSIS — E871 Hypo-osmolality and hyponatremia: Secondary | ICD-10-CM

## 2024-05-08 DIAGNOSIS — E222 Syndrome of inappropriate secretion of antidiuretic hormone: Secondary | ICD-10-CM

## 2024-05-08 LAB — COMPREHENSIVE METABOLIC PANEL WITH GFR
AG Ratio: 1.8 (calc) (ref 1.0–2.5)
ALT: 15 U/L (ref 6–29)
AST: 19 U/L (ref 10–35)
Albumin: 4.5 g/dL (ref 3.6–5.1)
Alkaline phosphatase (APISO): 127 U/L (ref 37–153)
BUN/Creatinine Ratio: 53 (calc) — ABNORMAL HIGH (ref 6–22)
BUN: 27 mg/dL — ABNORMAL HIGH (ref 7–25)
CO2: 24 mmol/L (ref 20–32)
Calcium: 10.5 mg/dL — ABNORMAL HIGH (ref 8.6–10.4)
Chloride: 100 mmol/L (ref 98–110)
Creat: 0.51 mg/dL — ABNORMAL LOW (ref 0.60–0.95)
Globulin: 2.5 g/dL (ref 1.9–3.7)
Glucose, Bld: 140 mg/dL — ABNORMAL HIGH (ref 65–99)
Potassium: 5.3 mmol/L (ref 3.5–5.3)
Sodium: 137 mmol/L (ref 135–146)
Total Bilirubin: 0.3 mg/dL (ref 0.2–1.2)
Total Protein: 7 g/dL (ref 6.1–8.1)
eGFR: 91 mL/min/1.73m2

## 2024-05-08 LAB — CBC WITH DIFFERENTIAL/PLATELET
Absolute Lymphocytes: 1749 {cells}/uL (ref 850–3900)
Absolute Monocytes: 750 {cells}/uL (ref 200–950)
Basophils Absolute: 27 {cells}/uL (ref 0–200)
Basophils Relative: 0.4 %
Eosinophils Absolute: 101 {cells}/uL (ref 15–500)
Eosinophils Relative: 1.5 %
HCT: 37.7 % (ref 35.9–46.0)
Hemoglobin: 12.2 g/dL (ref 11.7–15.5)
MCH: 30.9 pg (ref 27.0–33.0)
MCHC: 32.4 g/dL (ref 31.6–35.4)
MCV: 95.4 fL (ref 81.4–101.7)
MPV: 10 fL (ref 7.5–12.5)
Monocytes Relative: 11.2 %
Neutro Abs: 4074 {cells}/uL (ref 1500–7800)
Neutrophils Relative %: 60.8 %
Platelets: 325 Thousand/uL (ref 140–400)
RBC: 3.95 Million/uL (ref 3.80–5.10)
RDW: 12.1 % (ref 11.0–15.0)
Total Lymphocyte: 26.1 %
WBC: 6.7 Thousand/uL (ref 3.8–10.8)

## 2024-05-08 LAB — TEST AUTHORIZATION

## 2024-05-08 LAB — HEMOGLOBIN A1C
Hgb A1c MFr Bld: 8 % — ABNORMAL HIGH
Mean Plasma Glucose: 183 mg/dL
eAG (mmol/L): 10.1 mmol/L

## 2024-05-08 LAB — B12 AND FOLATE PANEL
Folate: >24 ng/mL
Vitamin B-12: 1043 pg/mL (ref 200–1100)

## 2024-05-08 NOTE — Patient Instructions (Signed)
 Goals Addressed             This Visit's Progress    Pharmacy Goals       The goal A1c is less than 7%. This is the best way to reduce the risk of the long term complications of diabetes, including heart disease, kidney disease, eye disease, strokes, and nerve damage. An A1c of less than 7% corresponds with fasting sugars less than 130 and 2 hour after meal sugars less than 180.   Our goal bad cholesterol, or LDL, is less than 70 . This is why it is important to continue taking your rosuvastatin .  Check your blood pressure once weekly, and any time you have concerning symptoms like headache, chest pain, dizziness, shortness of breath, or vision changes.   To appropriately check your blood pressure, make sure you do the following:  1) Avoid caffeine, exercise, or tobacco products for 30 minutes before checking. Empty your bladder. 2) Sit with your back supported in a flat-backed chair. Rest your arm on something flat (arm of the chair, table, etc). 3) Sit still with your feet flat on the floor, resting, for at least 5 minutes.  4) Check your blood pressure. Take 1-2 readings.  5) Write down these readings and bring with you to any provider appointments.  Bring your home blood pressure machine with you to a provider's office for accuracy comparison at least once a year.   Make sure you take your blood pressure medications before you come to any office visit, even if you were asked to fast for labs.  Sharyle Sia, PharmD, [facility] [phone]

## 2024-05-10 LAB — COMPREHENSIVE METABOLIC PANEL WITH GFR
AG Ratio: 1.7 (calc) (ref 1.0–2.5)
ALT: 18 U/L (ref 6–29)
AST: 19 U/L (ref 10–35)
Albumin: 4 g/dL (ref 3.6–5.1)
Alkaline phosphatase (APISO): 111 U/L (ref 37–153)
BUN/Creatinine Ratio: 33 (calc) — ABNORMAL HIGH (ref 6–22)
BUN: 19 mg/dL (ref 7–25)
CO2: 30 mmol/L (ref 20–32)
Calcium: 9.9 mg/dL (ref 8.6–10.4)
Chloride: 100 mmol/L (ref 98–110)
Creat: 0.57 mg/dL — ABNORMAL LOW (ref 0.60–0.95)
Globulin: 2.3 g/dL (ref 1.9–3.7)
Glucose, Bld: 187 mg/dL — ABNORMAL HIGH (ref 65–139)
Potassium: 4.9 mmol/L (ref 3.5–5.3)
Sodium: 136 mmol/L (ref 135–146)
Total Bilirubin: 0.4 mg/dL (ref 0.2–1.2)
Total Protein: 6.3 g/dL (ref 6.1–8.1)
eGFR: 88 mL/min/1.73m2

## 2024-05-10 LAB — URINALYSIS, ROUTINE W REFLEX MICROSCOPIC
Bacteria, UA: NONE SEEN /HPF
Bilirubin Urine: NEGATIVE
Glucose, UA: NEGATIVE
Hgb urine dipstick: NEGATIVE
Ketones, ur: NEGATIVE
Leukocytes,Ua: NEGATIVE
Nitrite: NEGATIVE
RBC / HPF: NONE SEEN /HPF (ref 0–2)
Specific Gravity, Urine: 1.021 (ref 1.001–1.035)
pH: 5.5 (ref 5.0–8.0)

## 2024-05-10 LAB — OSMOLALITY: Osmolality: 301 mosm/kg (ref 278–305)

## 2024-05-10 LAB — T4, FREE: Free T4: 1.4 ng/dL (ref 0.8–1.8)

## 2024-05-10 LAB — CBC WITH DIFFERENTIAL/PLATELET
Absolute Lymphocytes: 1595 {cells}/uL (ref 850–3900)
Absolute Monocytes: 535 {cells}/uL (ref 200–950)
Basophils Absolute: 37 {cells}/uL (ref 0–200)
Basophils Relative: 0.7 %
Eosinophils Absolute: 58 {cells}/uL (ref 15–500)
Eosinophils Relative: 1.1 %
HCT: 37.9 % (ref 35.9–46.0)
Hemoglobin: 12 g/dL (ref 11.7–15.5)
MCH: 30.8 pg (ref 27.0–33.0)
MCHC: 31.7 g/dL (ref 31.6–35.4)
MCV: 97.2 fL (ref 81.4–101.7)
MPV: 10.2 fL (ref 7.5–12.5)
Monocytes Relative: 10.1 %
Neutro Abs: 3074 {cells}/uL (ref 1500–7800)
Neutrophils Relative %: 58 %
Platelets: 278 Thousand/uL (ref 140–400)
RBC: 3.9 Million/uL (ref 3.80–5.10)
RDW: 11.8 % (ref 11.0–15.0)
Total Lymphocyte: 30.1 %
WBC: 5.3 Thousand/uL (ref 3.8–10.8)

## 2024-05-10 LAB — OSMOLALITY, URINE: Osmolality, Ur: 755 mosm/kg (ref 50–1200)

## 2024-05-10 LAB — TSH: TSH: 1.78 m[IU]/L (ref 0.40–4.50)

## 2024-05-10 LAB — SODIUM, URINE, RANDOM: Sodium, Ur: 127 mmol/L (ref 28–272)

## 2024-05-10 LAB — MICROSCOPIC MESSAGE

## 2024-05-12 ENCOUNTER — Ambulatory Visit: Payer: Self-pay | Admitting: Family Medicine

## 2024-05-15 ENCOUNTER — Encounter: Payer: Self-pay | Admitting: Family Medicine

## 2024-05-15 ENCOUNTER — Ambulatory Visit: Admitting: Family Medicine

## 2024-05-15 VITALS — BP 140/68 | HR 75 | Ht 61.0 in | Wt 96.1 lb

## 2024-05-15 DIAGNOSIS — E222 Syndrome of inappropriate secretion of antidiuretic hormone: Secondary | ICD-10-CM

## 2024-05-15 DIAGNOSIS — E871 Hypo-osmolality and hyponatremia: Secondary | ICD-10-CM

## 2024-05-15 DIAGNOSIS — E113293 Type 2 diabetes mellitus with mild nonproliferative diabetic retinopathy without macular edema, bilateral: Secondary | ICD-10-CM | POA: Diagnosis not present

## 2024-05-15 DIAGNOSIS — Z794 Long term (current) use of insulin: Secondary | ICD-10-CM

## 2024-05-15 NOTE — Patient Instructions (Addendum)
 Thank you for coming to the office today.  Double dose of Metformin  XR 500mg  x 2 = 1000mg  daily  The goal is to stabilize sugars overall to avoid reducing.  Continue Lantus  insulin  12 units daily with meal / lunch time  It is okay to keep current Novolog  mealtime insulin  for now. My goal is to reduce this in future.  Notes - Current SSI for Novolog  Mealtime  CBG < 150 0 units CBG 151-200 2 units CBG 201-250 3 units CBG 251-300 5 units CBG 301-350 8 units CBG 351-400 11 units  Goal to keep fluid water restriction to 32 oz per day is the goal. Higher amount can possibly reduce sodium.  Likely diagnosis SIADH for the low sodium  Please schedule a Follow-up Appointment to: Return if symptoms worsen or fail to improve.  If you have any other questions or concerns, please feel free to call the office or send a message through MyChart. You may also schedule an earlier appointment if necessary.  Additionally, you may be receiving a survey about your experience at our office within a few days to 1 week by e-mail or mail. We value your feedback.  Marsa Officer, DO [facility], [place]

## 2024-05-15 NOTE — Progress Notes (Signed)
 "  Subjective:    Patient ID: Barbara Santos, female    DOB: Aug 23, 1935, 89 y.o.   MRN: [id number]  Barbara Santos is a 89 y.o. female presenting on 05/15/2024 for Medical Management of Chronic Issues   HPI  Discussed the use of AI scribe software for clinical note transcription with the patient, who gave verbal consent to proceed.  History of Present Illness   Barbara Santos is an 89 year old female with diabetes who presents for follow-up regarding blood sugar management and lab results.  Type 2 Diabetes with retinopathy Glycemic control and hypoglycemic symptoms - Experiences some hypoglycemia-like symptoms w/ weakness and nausea on two occasions around midnight, suspected to be related to low blood glucose levels. - Symptoms resolved within ten minutes after consuming orange juice and a small snack - During one episode, blood glucose measured at 134 mg/dL - Blood glucose fluctuates throughout the day: mid-200s mg/dL in the morning before insulin , drops below 100 mg/dL by late afternoon after insulin  administration - Evening blood glucose has dropped to as low as 67 mg/dL, managed with juice intake - On daily Lantus  12u and SSI Novolog , Metformin  XR 500 daily tolerating well - Experienced confusion with insulin  regimen, including an incident of accidental double dosing of mealtime insulin  resulting in hypoglycemia  Laboratory findings and renal function - Recent laboratory results show trace proteinuria - Serum protein levels are within normal limits  SIADH Labs reviewed suggested SIADH, confirmed w/ previous diagnosis Sodium has normalized Hydration status - Consuming approximately 40 ounces of water daily           04/25/2024    4:15 PM 01/31/2024    2:14 PM 10/09/2023   10:29 AM  Depression screen PHQ 2/9  Decreased Interest 0 0 0  Down, Depressed, Hopeless 0 0 0  PHQ - 2 Score 0 0 0  Altered sleeping 0  0  Tired, decreased energy 0  0  Change in  appetite 0  0  Feeling bad or failure about yourself  0  0  Trouble concentrating 0  0  Moving slowly or fidgety/restless 0  0  Suicidal thoughts 0  0  PHQ-9 Score 0  0   Difficult doing work/chores Not difficult at all  Not difficult at all     Data saved with a previous flowsheet row definition       04/25/2024    4:16 PM 10/09/2023   10:29 AM 04/06/2023    9:52 AM 10/04/2022    8:45 AM  GAD 7 : Generalized Anxiety Score  Nervous, Anxious, on Edge 0  0  0  0   Control/stop worrying 0  0  0  0   Worry too much - different things 0  0  0  0   Trouble relaxing 0  0  0  0   Restless 0  0  0  0   Easily annoyed or irritable 0  0  0  0   Afraid - awful might happen 0  0  0  0   Total GAD 7 Score 0 0 0 0  Anxiety Difficulty Not difficult at all Not difficult at all  Not difficult at all     Data saved with a previous flowsheet row definition    Social History[1]  Review of Systems Per HPI unless specifically indicated above     Objective:    BP (!) 140/68 (BP Location: Right Arm, Patient Position: Sitting,  Cuff Size: Normal)   Pulse 75   Ht 5' 1 (1.549 m)   Wt 96 lb 2 oz (43.6 kg)   SpO2 94%   BMI 18.16 kg/m   Wt Readings from Last 3 Encounters:  05/15/24 96 lb 2 oz (43.6 kg)  05/01/24 90 lb 8 oz (41.1 kg)  04/25/24 93 lb 6.4 oz (42.4 kg)    Physical Exam Vitals and nursing note reviewed.  Constitutional:      General: She is not in acute distress.    Appearance: Normal appearance. She is well-developed. She is not diaphoretic.     Comments: Thin frail elderly 89 yr female, comfortable, cooperative  HENT:     Head: Normocephalic and atraumatic.  Eyes:     General:        Right eye: No discharge.        Left eye: No discharge.     Conjunctiva/sclera: Conjunctivae normal.  Cardiovascular:     Rate and Rhythm: Normal rate.  Pulmonary:     Effort: Pulmonary effort is normal.  Musculoskeletal:     Right lower leg: No edema.     Left lower leg: No edema.   Skin:    General: Skin is warm and dry.     Findings: No erythema or rash.  Neurological:     Mental Status: She is alert and oriented to person, place, and time.  Psychiatric:        Mood and Affect: Mood normal.        Behavior: Behavior normal.        Thought Content: Thought content normal.     Comments: Well groomed, good eye contact, normal speech and thoughts     Results for orders placed or performed in visit on 05/08/24  Osmolality   Collection Time: 05/08/24  8:50 AM  Result Value Ref Range   Osmolality 301 278 - 305 mOsm/kg  T4, free   Collection Time: 05/08/24  8:50 AM  Result Value Ref Range   Free T4 1.4 0.8 - 1.8 ng/dL  TSH   Collection Time: 05/08/24  8:50 AM  Result Value Ref Range   TSH 1.78 0.40 - 4.50 mIU/L  CBC with Differential/Platelet   Collection Time: 05/08/24  8:50 AM  Result Value Ref Range   WBC 5.3 3.8 - 10.8 Thousand/uL   RBC 3.90 3.80 - 5.10 Million/uL   Hemoglobin 12.0 11.7 - 15.5 g/dL   HCT 62.0 64.0 - 53.9 %   MCV 97.2 81.4 - 101.7 fL   MCH 30.8 27.0 - 33.0 pg   MCHC 31.7 31.6 - 35.4 g/dL   RDW 88.1 88.9 - 84.9 %   Platelets 278 140 - 400 Thousand/uL   MPV 10.2 7.5 - 12.5 fL   Neutro Abs 3,074 1,500 - 7,800 cells/uL   Absolute Lymphocytes 1,595 850 - 3,900 cells/uL   Absolute Monocytes 535 200 - 950 cells/uL   Eosinophils Absolute 58 15 - 500 cells/uL   Basophils Absolute 37 0 - 200 cells/uL   Neutrophils Relative % 58 %   Total Lymphocyte 30.1 %   Monocytes Relative 10.1 %   Eosinophils Relative 1.1 %   Basophils Relative 0.7 %  Comprehensive metabolic panel with GFR   Collection Time: 05/08/24  8:50 AM  Result Value Ref Range   Glucose, Bld 187 (H) 65 - 139 mg/dL   BUN 19 7 - 25 mg/dL   Creat 9.42 (L) 9.39 - 0.95 mg/dL   eGFR 88 >  OR = 60 mL/min/1.37m2   BUN/Creatinine Ratio 33 (H) 6 - 22 (calc)   Sodium 136 135 - 146 mmol/L   Potassium 4.9 3.5 - 5.3 mmol/L   Chloride 100 98 - 110 mmol/L   CO2 30 20 - 32 mmol/L    Calcium  9.9 8.6 - 10.4 mg/dL   Total Protein 6.3 6.1 - 8.1 g/dL   Albumin 4.0 3.6 - 5.1 g/dL   Globulin 2.3 1.9 - 3.7 g/dL (calc)   AG Ratio 1.7 1.0 - 2.5 (calc)   Total Bilirubin 0.4 0.2 - 1.2 mg/dL   Alkaline phosphatase (APISO) 111 37 - 153 U/L   AST 19 10 - 35 U/L   ALT 18 6 - 29 U/L  MICROSCOPIC MESSAGE   Collection Time: 05/08/24  9:14 AM  Result Value Ref Range   Note    Sodium, urine, random   Collection Time: 05/08/24  9:14 AM  Result Value Ref Range   Sodium, Ur 127 28 - 272 mmol/L  Urinalysis, Routine w reflex microscopic   Collection Time: 05/08/24  9:14 AM  Result Value Ref Range   Color, Urine YELLOW YELLOW   APPearance CLEAR CLEAR   Specific Gravity, Urine 1.021 1.001 - 1.035   pH 5.5 5.0 - 8.0   Glucose, UA NEGATIVE NEGATIVE   Bilirubin Urine NEGATIVE NEGATIVE   Ketones, ur NEGATIVE NEGATIVE   Hgb urine dipstick NEGATIVE NEGATIVE   Protein, ur TRACE (A) NEGATIVE   Nitrite NEGATIVE NEGATIVE   Leukocytes,Ua NEGATIVE NEGATIVE   WBC, UA 0-5 0 - 5 /HPF   RBC / HPF NONE SEEN 0 - 2 /HPF   Squamous Epithelial / HPF 0-5 < OR = 5 /HPF   Bacteria, UA NONE SEEN NONE SEEN /HPF   Calcium  Oxalate Crystal MODERATE (A) NONE OR FEW /HPF   Uric Acid Crystal FEW NONE OR FEW /HPF   Hyaline Cast 0-5 (A) NONE SEEN /LPF  Osmolality, urine   Collection Time: 05/08/24  9:14 AM  Result Value Ref Range   Osmolality, Ur 755 50 - 1,200 mOsm/kg      Assessment & Plan:   Problem List Items Addressed This Visit     Hyponatremia   SIADH (syndrome of inappropriate ADH production)   Type II diabetes mellitus (HCC) - Primary   Relevant Medications   metFORMIN  (GLUCOPHAGE -XR) 500 MG 24 hr tablet   Other Visit Diagnoses       Long term current use of insulin  (HCC)             Type 2 diabetes mellitus with mild nonproliferative diabetic retinopathy, bilateral Blood glucose fluctuates with afternoon hypoglycemia. Awaiting approval for new medication with DPP4 Januvia but needs  social security financial documentation first. Emphasized stable glucose levels to prevent hypo/hyperglycemia. Preference is to avoid mealtime insulin  to avoid potential hypoglycemia, and some noted accidental dosing errors by patient  Reviewed CBG data  - Increased metformin  to two extended-release tablets daily. 500mg  x 2 = 1000mg  XR daily for more glucose stability - Maintained current insulin  regimen with Lantus  12u daily with lunch and Novolog  SSI 0-11 units - Monitor blood glucose levels closely. - Await future approval / financial barrier resolution on new medications such as DPP4 with goal to further reduce and discontinue mealtime insulin , maintain basal + oral medications is the goal. Ideally reducing mealtime insulin  and her total daily insulin  dose in general would reduce risk of future hypoglycemia that she has been prone to before.   Syndrome of  inappropriate antidiuretic hormone secretion (SIADH) with hyponatremia Labs reviewed w/ urine and blood sodium evaluation and Osm consistent with SIADH SIADH well-managed with sodium levels at 136-137. Fluid restriction necessary. - Restrict fluid intake to 32 ounces per day. - Monitor sodium levels regularly.      Notes - Current SSI for Novolog  Mealtime  CBG < 150 0 units CBG 151-200 2 units CBG 201-250 3 units CBG 251-300 5 units CBG 301-350 8 units CBG 351-400 11 units  No orders of the defined types were placed in this encounter.   No orders of the defined types were placed in this encounter.   Follow up plan: Return if symptoms worsen or fail to improve.  Barbara Officer, DO [facility] 05/15/2024, 10:38 AM     [1]  Social History Tobacco Use   Smoking status: Never   Smokeless tobacco: Never  Vaping Use   Vaping status: Never Used  Substance Use Topics   Alcohol use: No    Alcohol/week: 0.0 standard drinks of alcohol   Drug use: No   "

## 2024-05-26 ENCOUNTER — Ambulatory Visit: Admitting: Internal Medicine

## 2024-06-05 ENCOUNTER — Ambulatory Visit: Admitting: Internal Medicine

## 2024-07-08 ENCOUNTER — Ambulatory Visit: Admitting: Dermatology

## 2025-02-13 ENCOUNTER — Ambulatory Visit
